# Patient Record
Sex: Female | Born: 1945 | Race: White | Hispanic: No | Smoking: Never smoker
Health system: Southern US, Community
[De-identification: ages and names within clinical notes are randomized; demographics above are authoritative.]

## PROBLEM LIST (undated history)

## (undated) DIAGNOSIS — M1990 Unspecified osteoarthritis, unspecified site: Secondary | ICD-10-CM

## (undated) DIAGNOSIS — Z87442 Personal history of urinary calculi: Secondary | ICD-10-CM

## (undated) DIAGNOSIS — Z9889 Other specified postprocedural states: Secondary | ICD-10-CM

## (undated) DIAGNOSIS — L309 Dermatitis, unspecified: Secondary | ICD-10-CM

## (undated) DIAGNOSIS — G709 Myoneural disorder, unspecified: Secondary | ICD-10-CM

## (undated) DIAGNOSIS — K5792 Diverticulitis of intestine, part unspecified, without perforation or abscess without bleeding: Secondary | ICD-10-CM

## (undated) DIAGNOSIS — M5430 Sciatica, unspecified side: Secondary | ICD-10-CM

## (undated) DIAGNOSIS — M51369 Other intervertebral disc degeneration, lumbar region without mention of lumbar back pain or lower extremity pain: Secondary | ICD-10-CM

## (undated) DIAGNOSIS — T8859XA Other complications of anesthesia, initial encounter: Secondary | ICD-10-CM

## (undated) DIAGNOSIS — J189 Pneumonia, unspecified organism: Secondary | ICD-10-CM

## (undated) DIAGNOSIS — R011 Cardiac murmur, unspecified: Secondary | ICD-10-CM

## (undated) DIAGNOSIS — K219 Gastro-esophageal reflux disease without esophagitis: Secondary | ICD-10-CM

## (undated) DIAGNOSIS — M419 Scoliosis, unspecified: Secondary | ICD-10-CM

## (undated) DIAGNOSIS — R112 Nausea with vomiting, unspecified: Secondary | ICD-10-CM

## (undated) DIAGNOSIS — R7303 Prediabetes: Secondary | ICD-10-CM

## (undated) DIAGNOSIS — Z8489 Family history of other specified conditions: Secondary | ICD-10-CM

## (undated) HISTORY — PX: ADENOIDECTOMY: SUR15

## (undated) HISTORY — PX: CHOLECYSTECTOMY: SHX55

## (undated) HISTORY — DX: Dermatitis, unspecified: L30.9

## (undated) HISTORY — PX: TONSILLECTOMY: SUR1361

## (undated) HISTORY — PX: CATARACT EXTRACTION: SUR2

## (undated) HISTORY — PX: BACK SURGERY: SHX140

## (undated) SURGERY — Surgical Case
Anesthesia: *Unknown

---

## 1970-12-08 HISTORY — PX: CHOLECYSTECTOMY: SHX55

## 1977-12-08 HISTORY — PX: GALLBLADDER SURGERY: SHX652

## 1977-12-08 HISTORY — PX: APPENDECTOMY: SHX54

## 1979-12-09 HISTORY — PX: BACK SURGERY: SHX140

## 2012-11-28 ENCOUNTER — Emergency Department (HOSPITAL_BASED_OUTPATIENT_CLINIC_OR_DEPARTMENT_OTHER)
Admission: EM | Admit: 2012-11-28 | Discharge: 2012-11-28 | Disposition: A | Discharge status: Home / Self Care | Payer: No Typology Code available for payment source | Attending: Emergency Medicine | Admitting: Emergency Medicine

## 2012-11-28 ENCOUNTER — Encounter (HOSPITAL_BASED_OUTPATIENT_CLINIC_OR_DEPARTMENT_OTHER): Payer: Self-pay | Admitting: Emergency Medicine

## 2012-11-28 ENCOUNTER — Emergency Department (HOSPITAL_BASED_OUTPATIENT_CLINIC_OR_DEPARTMENT_OTHER): Payer: No Typology Code available for payment source

## 2012-11-28 DIAGNOSIS — S6990XA Unspecified injury of unspecified wrist, hand and finger(s), initial encounter: Secondary | ICD-10-CM | POA: Insufficient documentation

## 2012-11-28 DIAGNOSIS — Y93I9 Activity, other involving external motion: Secondary | ICD-10-CM | POA: Insufficient documentation

## 2012-11-28 DIAGNOSIS — S60219A Contusion of unspecified wrist, initial encounter: Secondary | ICD-10-CM

## 2012-11-28 DIAGNOSIS — S161XXA Strain of muscle, fascia and tendon at neck level, initial encounter: Secondary | ICD-10-CM

## 2012-11-28 DIAGNOSIS — S39012A Strain of muscle, fascia and tendon of lower back, initial encounter: Secondary | ICD-10-CM

## 2012-11-28 DIAGNOSIS — S335XXA Sprain of ligaments of lumbar spine, initial encounter: Secondary | ICD-10-CM | POA: Insufficient documentation

## 2012-11-28 DIAGNOSIS — S139XXA Sprain of joints and ligaments of unspecified parts of neck, initial encounter: Secondary | ICD-10-CM | POA: Insufficient documentation

## 2012-11-28 DIAGNOSIS — S59909A Unspecified injury of unspecified elbow, initial encounter: Secondary | ICD-10-CM | POA: Insufficient documentation

## 2012-11-28 DIAGNOSIS — Z79899 Other long term (current) drug therapy: Secondary | ICD-10-CM | POA: Insufficient documentation

## 2012-11-28 DIAGNOSIS — Y92410 Unspecified street and highway as the place of occurrence of the external cause: Secondary | ICD-10-CM | POA: Insufficient documentation

## 2012-11-28 DIAGNOSIS — IMO0002 Reserved for concepts with insufficient information to code with codable children: Secondary | ICD-10-CM | POA: Insufficient documentation

## 2012-11-28 DIAGNOSIS — Z9889 Other specified postprocedural states: Secondary | ICD-10-CM | POA: Insufficient documentation

## 2012-11-28 MED ORDER — HYDROCODONE-ACETAMINOPHEN 5-325 MG PO TABS: 2.0000 | ORAL_TABLET | ORAL | Status: DC | PRN

## 2012-11-28 MED ORDER — CYCLOBENZAPRINE HCL 10 MG PO TABS: 10.0000 mg | ORAL_TABLET | Freq: Two times a day (BID) | ORAL | Status: DC | PRN
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
## 2012-11-28 NOTE — ED Notes (Signed)
Patient stated they could not take hydrocodone due to nausea & will add to their list of allergies
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
## 2012-11-28 NOTE — ED Notes (Signed)
Patient and daughter expressed their concern over weight and stated they really didn't see anyone in department, tried to explain situation to family but they stated they just wanted to leave. Added additional allergies to chart after discussing prescription to patient.

## 2012-12-17 ENCOUNTER — Emergency Department (HOSPITAL_BASED_OUTPATIENT_CLINIC_OR_DEPARTMENT_OTHER): Payer: No Typology Code available for payment source

## 2012-12-17 ENCOUNTER — Encounter (HOSPITAL_BASED_OUTPATIENT_CLINIC_OR_DEPARTMENT_OTHER): Payer: Self-pay | Admitting: Emergency Medicine

## 2012-12-17 ENCOUNTER — Emergency Department (HOSPITAL_BASED_OUTPATIENT_CLINIC_OR_DEPARTMENT_OTHER)
Admission: EM | Admit: 2012-12-17 | Discharge: 2012-12-17 | Disposition: A | Discharge status: Home / Self Care | Payer: No Typology Code available for payment source | Attending: Emergency Medicine | Admitting: Emergency Medicine

## 2012-12-17 DIAGNOSIS — S59919A Unspecified injury of unspecified forearm, initial encounter: Secondary | ICD-10-CM | POA: Insufficient documentation

## 2012-12-17 DIAGNOSIS — S32009A Unspecified fracture of unspecified lumbar vertebra, initial encounter for closed fracture: Secondary | ICD-10-CM | POA: Insufficient documentation

## 2012-12-17 DIAGNOSIS — S129XXA Fracture of neck, unspecified, initial encounter: Secondary | ICD-10-CM | POA: Insufficient documentation

## 2012-12-17 DIAGNOSIS — M1990 Unspecified osteoarthritis, unspecified site: Secondary | ICD-10-CM | POA: Insufficient documentation

## 2012-12-17 DIAGNOSIS — Y939 Activity, unspecified: Secondary | ICD-10-CM | POA: Insufficient documentation

## 2012-12-17 DIAGNOSIS — IMO0002 Reserved for concepts with insufficient information to code with codable children: Secondary | ICD-10-CM

## 2012-12-17 DIAGNOSIS — Z79899 Other long term (current) drug therapy: Secondary | ICD-10-CM | POA: Insufficient documentation

## 2012-12-17 DIAGNOSIS — Y92410 Unspecified street and highway as the place of occurrence of the external cause: Secondary | ICD-10-CM | POA: Insufficient documentation

## 2012-12-17 DIAGNOSIS — S6990XA Unspecified injury of unspecified wrist, hand and finger(s), initial encounter: Secondary | ICD-10-CM | POA: Insufficient documentation

## 2012-12-17 DIAGNOSIS — S59909A Unspecified injury of unspecified elbow, initial encounter: Secondary | ICD-10-CM | POA: Insufficient documentation

## 2012-12-17 MED ORDER — AMOXICILLIN 250 MG PO CAPS: 250.0000 mg | ORAL_CAPSULE | Freq: Three times a day (TID) | ORAL | Status: DC

## 2012-12-17 MED ORDER — AZITHROMYCIN 250 MG PO TABS: ORAL_TABLET | ORAL | Status: DC

## 2012-12-17 MED ORDER — PSEUDOEPHEDRINE HCL ER 120 MG PO TB12: 120.0000 mg | ORAL_TABLET | Freq: Two times a day (BID) | ORAL | Status: DC

## 2012-12-17 NOTE — ED Notes (Signed)
Patient transported to X-ray 
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
## 2012-12-17 NOTE — ED Notes (Signed)
MD at bedside. 
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
## 2012-12-18 ENCOUNTER — Ambulatory Visit (HOSPITAL_BASED_OUTPATIENT_CLINIC_OR_DEPARTMENT_OTHER)
Admit: 2012-12-18 | Discharge: 2012-12-18 | Disposition: A | Discharge status: Home / Self Care | Payer: No Typology Code available for payment source | Attending: Emergency Medicine | Admitting: Emergency Medicine

## 2012-12-18 DIAGNOSIS — M8448XA Pathological fracture, other site, initial encounter for fracture: Secondary | ICD-10-CM | POA: Insufficient documentation

## 2016-04-05 ENCOUNTER — Encounter (HOSPITAL_BASED_OUTPATIENT_CLINIC_OR_DEPARTMENT_OTHER): Payer: Self-pay | Admitting: Emergency Medicine

## 2016-04-05 ENCOUNTER — Emergency Department (HOSPITAL_BASED_OUTPATIENT_CLINIC_OR_DEPARTMENT_OTHER)
Admission: EM | Admit: 2016-04-05 | Discharge: 2016-04-06 | Disposition: A | Discharge status: Home / Self Care | Payer: Medicare HMO | Attending: Emergency Medicine | Admitting: Emergency Medicine

## 2016-04-05 ENCOUNTER — Emergency Department (HOSPITAL_BASED_OUTPATIENT_CLINIC_OR_DEPARTMENT_OTHER): Payer: Medicare HMO

## 2016-04-05 DIAGNOSIS — Y929 Unspecified place or not applicable: Secondary | ICD-10-CM | POA: Insufficient documentation

## 2016-04-05 DIAGNOSIS — S52135A Nondisplaced fracture of neck of left radius, initial encounter for closed fracture: Secondary | ICD-10-CM | POA: Diagnosis not present

## 2016-04-05 DIAGNOSIS — W010XXS Fall on same level from slipping, tripping and stumbling without subsequent striking against object, sequela: Secondary | ICD-10-CM | POA: Insufficient documentation

## 2016-04-05 DIAGNOSIS — Y9364 Activity, baseball: Secondary | ICD-10-CM | POA: Insufficient documentation

## 2016-04-05 DIAGNOSIS — Y999 Unspecified external cause status: Secondary | ICD-10-CM | POA: Insufficient documentation

## 2016-04-05 DIAGNOSIS — S59912A Unspecified injury of left forearm, initial encounter: Secondary | ICD-10-CM | POA: Diagnosis present

## 2016-04-05 DIAGNOSIS — S52132A Displaced fracture of neck of left radius, initial encounter for closed fracture: Secondary | ICD-10-CM

## 2016-04-05 NOTE — ED Provider Notes (Signed)
CSN: [id number]     Arrival date & time 04/05/16  1925 History  By signing my name below, I, Bethel BornBritney McCollum, attest that this documentation has been prepared under the direction and in the presence of Tilden FossaElizabeth Georgette Helmer, MD. Electronically Signed: Bethel BornBritney McCollum, ED Scribe. 04/05/2016. 8:49 PM   Chief Complaint  Patient presents with  . Arm Pain    The history is provided by the patient. No language interpreter was used.   Tracey Morgan is a 70 y.o. right-hand dominant female who presents to the Emergency Department complaining of a fall this evening at a softball game. The pt states that she tripped over an inconsistency in the sidewalk and fell to the left side.  Associated symptoms include left elbow and forearm pain, left knee abrasion, and an abrasion at the right hand. No head injury or LOC. She has broken her left wrist twice. NKDA.   History reviewed. No pertinent past medical history. Past Surgical History  Procedure Laterality Date  . Back surgery    . Cholecystectomy     History reviewed. No pertinent family history. Social History  Substance Use Topics  . Smoking status: Never Smoker   . Smokeless tobacco: None  . Alcohol Use: No   OB History    No data available     Review of Systems  Musculoskeletal: Positive for arthralgias.  Skin: Positive for wound.  Neurological: Negative for syncope.  All other systems reviewed and are negative.   Allergies  Hydrocodone; Percocet; Codeine; and Morphine and related  Home Medications   Prior to Admission medications   Medication Sig Start Date End Date Taking? Authorizing Provider  amoxicillin (AMOXIL) 250 MG capsule Take 1 capsule (250 mg total) by mouth 3 (three) times daily. 12/17/12   Nelva Nayobert Beaton, MD  cetirizine (ZYRTEC) 10 MG tablet Take 10 mg by mouth daily.    Historical Provider, MD  cyclobenzaprine (FLEXERIL) 10 MG tablet Take 1 tablet (10 mg total) by mouth 2 (two) times daily as needed for muscle spasms.  11/28/12   Rolan BuccoMelanie Belfi, MD  fluticasone (FLONASE) 50 MCG/ACT nasal spray Place 2 sprays into the nose daily.    Historical Provider, MD  HYDROcodone-acetaminophen (NORCO/VICODIN) 5-325 MG per tablet Take 2 tablets by mouth every 4 (four) hours as needed for pain. 11/28/12   Rolan BuccoMelanie Belfi, MD  pseudoephedrine (SUDAFED 12 HOUR) 120 MG 12 hr tablet Take 1 tablet (120 mg total) by mouth every 12 (twelve) hours. 12/17/12   Nelva Nayobert Beaton, MD   BP 144/87 mmHg  Pulse 91  Temp(Src) 98.3 F (36.8 C) (Oral)  Resp 20  Ht 5\' 4"  (1.626 m)  Wt 214 lb (97.07 kg)  BMI 36.72 kg/m2  SpO2 100% Physical Exam  Constitutional: She is oriented to person, place, and time. She appears well-developed and well-nourished.  HENT:  Head: Normocephalic and atraumatic.  Cardiovascular: Normal rate and regular rhythm.   No murmur heard. Pulmonary/Chest: Effort normal and breath sounds normal. No respiratory distress.  Musculoskeletal: She exhibits no edema or tenderness.  2+ radial pulses bilaterally Pain with supination and extension of the left elbow, no tenderness on exam   Neurological: She is alert and oriented to person, place, and time.  5/5 grip strength in BUE, sensation to light touch intact in BUE  Skin: Skin is warm and dry.  Psychiatric: She has a normal mood and affect. Her behavior is normal.  Nursing note and vitals reviewed.   ED Course  Procedures (including critical care time)  DIAGNOSTIC STUDIES: Oxygen Saturation is 100% on RA,  normal by my interpretation.    COORDINATION OF CARE: 8:46 PM Discussed treatment plan which includes left wrist, forearm, and elbow XRs with pt at bedside and pt agreed to plan.  Labs Review Labs Reviewed - No data to display  Imaging Review Dg Elbow Complete Left  04/05/2016  CLINICAL DATA:  Left elbow pain after fall today. Initial encounter. EXAM: LEFT ELBOW - COMPLETE 3+ VIEW COMPARISON:  None. FINDINGS: Probable nondisplaced fracture is seen involving the  radial head. No other fracture or dislocation is noted. Mild anterior fat pad displacement is noted suggesting possible joint effusion. IMPRESSION: Probable nondisplaced radial head fracture. Electronically Signed   By: Lupita Raider, M.D.   On: 04/05/2016 20:26   Dg Forearm Left  04/05/2016  CLINICAL DATA:  Tripped and fall. EXAM: LEFT FOREARM - 2 VIEW COMPARISON:  None. FINDINGS: There appears to be a nondisplaced fracture involving the radial neck. No dislocation. IMPRESSION: 1. Nondisplaced fracture involves the radial neck . Electronically Signed   By: Signa Kell M.D.   On: 04/05/2016 20:27   Dg Wrist Complete Left  04/05/2016  CLINICAL DATA:  Fall. EXAM: LEFT WRIST - COMPLETE 3+ VIEW COMPARISON:  11/28/2012 FINDINGS: There is no evidence of fracture or dislocation. Radiocarpal joint narrowing is identified. Chondrocalcinosis noted. IMPRESSION: 1. No acute findings. 2. Degenerative joint disease and chondrocalcinosis. Electronically Signed   By: Signa Kell M.D.   On: 04/05/2016 20:23   I have personally reviewed and evaluated these images as part of my medical decision-making.   EKG Interpretation None      MDM   Final diagnoses:  Radial neck fracture, left, closed, initial encounter   Patient here for evaluation of injuries following a mechanical fall. She does have pain with range of motion of the elbow but is able to fully range her elbow without difficulty. There is no bony tenderness. She is neurovascularly intact on examination. Placed in sling with outpatient orthopedic follow-up.  I personally performed the services described in this documentation, which was scribed in my presence. The recorded information has been reviewed and is accurate.    Tilden Fossa, MD 04/05/16 [phone]

## 2016-04-05 NOTE — ED Notes (Signed)
CMS intact before and after. Pt tolerated well. Pt did not have any questions.  

## 2016-04-05 NOTE — ED Notes (Signed)
Pt trip at softball game and fell on left arm and is having wrist and elbow

## 2016-04-05 NOTE — Discharge Instructions (Signed)
Radial Fracture  A radial fracture is a break in the radius bone, which is the long bone of the forearm that is on the same side as your thumb. Your forearm is the part of your arm that is between your elbow and your wrist. It is made up of two bones: the radius and the ulna.  Most radial fractures occur near the wrist (distal radialfracture) or near the elbow (radial head fracture). A distal radial fracture is the most common type of broken arm. This fracture usually occurs about an inch above the wrist. Fractures of the middle part of the bone are less common.  CAUSES   Falling with your arm outstretched is the most common cause of a radial fracture. Other causes include:   Car accidents.   Bike accidents.   A direct blow to the middle part of the radius.  RISK FACTORS   You may be at greater risk for a distal radial fracture if you are 60 years of age or older.   You may be at greater risk for a radial head fracture if you are:    Female.    30-40 years old.   You may be at a greater risk for all types of radial fractures if you have a condition that causes your bones to be weak or thin (osteoporosis).  SIGNS AND SYMPTOMS  A radial fracture causes pain immediately after the injury. Other signs and symptoms include:   An abnormal bend or bump in your arm (deformity).   Swelling.   Bruising.   Numbness or tingling.   Tenderness.   Limited movement.  DIAGNOSIS   Your health care provider may diagnose a radial fracture based on:   Your symptoms.   Your medical history, including any recent injury.   A physical exam. Your health care provider will look for any deformity and feel for tenderness over the break. Your health care provider will also check whether the bone is out of place.   An X-ray exam to confirm the diagnosis and learn more about the type of fracture.  TREATMENT  The goals of treatment are to get the bone in proper position for healing and to keep it from moving so it will heal over  time. Your treatment will depend on many factors, especially the type of fracture that you have.   If the fractured bone:    Is in the correct position (nondisplaced), you may only need to wear a cast or a splint.    Has a slightly displaced fracture, you may need to have the bones moved back into place manually (closed reduction) before the splint or cast is put on.   You may have a temporary splint before you have a plaster cast. The splint allows room for some swelling. After a few days, a cast can replace the splint.    You may have to wear the cast for about 6 weeks or as directed by your health care provider.    The cast may be changed after about 3 weeks or as directed by your health care provider.   After your cast is taken off, you may need physical therapy to regain full movement in your wrist or elbow.   You may need emergency surgery if you have:    A fractured bone that is out of position (displaced).    A fracture with multiple fragments (comminuted fracture).    A fracture that breaks the skin (open fracture). This type of   fracture may require surgical wires, plates, or screws to hold the bone in place.   You may have X-rays every couple of weeks to check on your healing.  HOME CARE INSTRUCTIONS   Keep the injured arm above the level of your heart while you are sitting or lying down. This helps to reduce swelling and pain.   Apply ice to the injured area:    Put ice in a plastic bag.    Place a towel between your skin and the bag.    Leave the ice on for 20 minutes, 2-3 times per day.   Move your fingers often to avoid stiffness and to minimize swelling.   If you have a plaster or fiberglass cast:    Do not try to scratch the skin under the cast using sharp or pointed objects.    Check the skin around the cast every day. You may put lotion on any red or sore areas.    Keep your cast dry and clean.   If you have a plaster splint:    Wear the splint as directed.    Loosen the elastic around  the splint if your fingers become numb and tingle, or if they turn cold and blue.   Do not put pressure on any part of your cast until it is fully hardened. Rest your cast only on a pillow for the first 24 hours.   Protect your cast or splint while bathing or showering, as directed by your health care provider. Do not put your cast or splint into water.   Take medicines only as directed by your health care provider.   Return to activities, such as sports, as directed by your health care provider. Ask your health care provider what activities are safe for you.   Keep all follow-up visits as directed by your health care provider. This is important.  SEEK MEDICAL CARE IF:   Your pain medicine is not helping.   Your cast gets damaged or it breaks.   Your cast becomes loose.   Your cast gets wet.   You have more severe pain or swelling than you did before the cast.   You have severe pain when stretching your fingers.   You continue to have pain or stiffness in your elbow or your wrist after your cast is taken off.  SEEK IMMEDIATE MEDICAL CARE IF:   You cannot move your fingers.   You lose feeling in your fingers or your hand.   Your hand or your fingers turn cold and pale or blue.   You notice a bad smell coming from your cast.   You have drainage from underneath your cast.   You have new stains from blood or drainage seeping through your cast.     This information is not intended to replace advice given to you by your health care provider. Make sure you discuss any questions you have with your health care provider.     Document Released: 05/07/2006 Document Revised: 12/15/2014 Document Reviewed: 05/19/2014  Elsevier Interactive Patient Education 2016 [employer]

## 2016-04-05 NOTE — ED Notes (Signed)
Tripped on uneven pavement, no LOC, denies: head, neck, back or pelvis pain, c/o L elbow and FA pain, worse with movement.

## 2016-04-25 DIAGNOSIS — K219 Gastro-esophageal reflux disease without esophagitis: Secondary | ICD-10-CM | POA: Diagnosis present

## 2016-04-25 DIAGNOSIS — R1319 Other dysphagia: Secondary | ICD-10-CM | POA: Insufficient documentation

## 2017-08-10 ENCOUNTER — Emergency Department (HOSPITAL_BASED_OUTPATIENT_CLINIC_OR_DEPARTMENT_OTHER): Payer: Medicare Other

## 2017-08-10 ENCOUNTER — Encounter (HOSPITAL_BASED_OUTPATIENT_CLINIC_OR_DEPARTMENT_OTHER): Payer: Self-pay | Admitting: *Deleted

## 2017-08-10 ENCOUNTER — Emergency Department (HOSPITAL_BASED_OUTPATIENT_CLINIC_OR_DEPARTMENT_OTHER)
Admission: EM | Admit: 2017-08-10 | Discharge: 2017-08-10 | Disposition: A | Discharge status: Home / Self Care | Payer: Medicare Other | Attending: Emergency Medicine | Admitting: Emergency Medicine

## 2017-08-10 DIAGNOSIS — R109 Unspecified abdominal pain: Secondary | ICD-10-CM | POA: Diagnosis present

## 2017-08-10 DIAGNOSIS — K5792 Diverticulitis of intestine, part unspecified, without perforation or abscess without bleeding: Secondary | ICD-10-CM

## 2017-08-10 DIAGNOSIS — Z79899 Other long term (current) drug therapy: Secondary | ICD-10-CM | POA: Diagnosis not present

## 2017-08-10 DIAGNOSIS — K5732 Diverticulitis of large intestine without perforation or abscess without bleeding: Secondary | ICD-10-CM | POA: Insufficient documentation

## 2017-08-10 HISTORY — DX: Diverticulitis of intestine, part unspecified, without perforation or abscess without bleeding: K57.92

## 2017-08-10 HISTORY — DX: Gastro-esophageal reflux disease without esophagitis: K21.9

## 2017-08-10 LAB — COMPREHENSIVE METABOLIC PANEL
ALBUMIN: 3.8 g/dL (ref 3.5–5.0)
ALT: 14 U/L (ref 14–54)
AST: 17 U/L (ref 15–41)
Alkaline Phosphatase: 110 U/L (ref 38–126)
Anion gap: 8 (ref 5–15)
BUN: 10 mg/dL (ref 6–20)
CHLORIDE: 100 mmol/L — AB (ref 101–111)
CO2: 25 mmol/L (ref 22–32)
Calcium: 9.2 mg/dL (ref 8.9–10.3)
Creatinine, Ser: 0.62 mg/dL (ref 0.44–1.00)
GFR calc Af Amer: >60 mL/min (ref >60)
GFR calc non Af Amer: >60 mL/min (ref >60)
Glucose, Bld: 156 mg/dL — ABNORMAL HIGH (ref 65–99)
POTASSIUM: 3.4 mmol/L — AB (ref 3.5–5.1)
SODIUM: 133 mmol/L — AB (ref 135–145)
Total Bilirubin: 1.9 mg/dL — ABNORMAL HIGH (ref 0.3–1.2)
Total Protein: 7.8 g/dL (ref 6.5–8.1)

## 2017-08-10 LAB — CBC WITH DIFFERENTIAL/PLATELET
BASOS ABS: 0 10*3/uL (ref 0.0–0.1)
Basophils Relative: 0 %
EOS PCT: 0 %
Eosinophils Absolute: 0 10*3/uL (ref 0.0–0.7)
HEMATOCRIT: 39.7 % (ref 36.0–46.0)
Hemoglobin: 13.3 g/dL (ref 12.0–15.0)
LYMPHS ABS: 1.4 10*3/uL (ref 0.7–4.0)
LYMPHS PCT: 11 %
MCH: 30.4 pg (ref 26.0–34.0)
MCHC: 33.5 g/dL (ref 30.0–36.0)
MCV: 90.8 fL (ref 78.0–100.0)
Monocytes Absolute: 1.1 10*3/uL — ABNORMAL HIGH (ref 0.1–1.0)
Monocytes Relative: 9 %
NEUTROS ABS: 9.8 10*3/uL — AB (ref 1.7–7.7)
Neutrophils Relative %: 80 %
PLATELETS: 411 10*3/uL — AB (ref 150–400)
RBC: 4.37 MIL/uL (ref 3.87–5.11)
RDW: 12.2 % (ref 11.5–15.5)
WBC: 12.4 10*3/uL — ABNORMAL HIGH (ref 4.0–10.5)

## 2017-08-10 LAB — URINALYSIS, MICROSCOPIC (REFLEX)

## 2017-08-10 LAB — URINALYSIS, ROUTINE W REFLEX MICROSCOPIC
Bilirubin Urine: NEGATIVE
GLUCOSE, UA: NEGATIVE mg/dL
Ketones, ur: NEGATIVE mg/dL
Nitrite: NEGATIVE
PH: 6.5 (ref 5.0–8.0)
PROTEIN: NEGATIVE mg/dL
Specific Gravity, Urine: <1.005 — ABNORMAL LOW (ref 1.005–1.030)

## 2017-08-10 LAB — LIPASE, BLOOD: Lipase: 24 U/L (ref 11–51)

## 2017-08-10 MED ORDER — ACETAMINOPHEN 325 MG PO TABS
650.0000 mg | ORAL_TABLET | Freq: Once | ORAL | Status: AC
  Administered 2017-08-10: 650 mg via ORAL
  Filled 2017-08-10: qty 2

## 2017-08-10 MED ORDER — ONDANSETRON 4 MG PO TBDP: 4.0000 mg | ORAL_TABLET | Freq: Three times a day (TID) | ORAL | 0 refills | Status: DC | PRN

## 2017-08-10 MED ORDER — POTASSIUM CHLORIDE CRYS ER 20 MEQ PO TBCR
40.0000 meq | EXTENDED_RELEASE_TABLET | Freq: Once | ORAL | Status: AC
  Administered 2017-08-10: 40 meq via ORAL
  Filled 2017-08-10: qty 2

## 2017-08-10 MED ORDER — ONDANSETRON HCL 4 MG/2ML IJ SOLN
4.0000 mg | Freq: Once | INTRAMUSCULAR | Status: AC
  Administered 2017-08-10: 4 mg via INTRAVENOUS
  Filled 2017-08-10: qty 2

## 2017-08-10 MED ORDER — IOPAMIDOL (ISOVUE-300) INJECTION 61%
100.0000 mL | Freq: Once | INTRAVENOUS | Status: AC | PRN
Start: 2017-08-10 — End: 2017-08-10
  Administered 2017-08-10: 100 mL via INTRAVENOUS

## 2017-08-10 MED ORDER — AMOXICILLIN-POT CLAVULANATE 875-125 MG PO TABS
1.0000 | ORAL_TABLET | Freq: Once | ORAL | Status: AC
  Administered 2017-08-10: 1 via ORAL
  Filled 2017-08-10: qty 1

## 2017-08-10 MED ORDER — SODIUM CHLORIDE 0.9 % IV BOLUS (SEPSIS)
1000.0000 mL | Freq: Once | INTRAVENOUS | Status: AC
  Administered 2017-08-10: 1000 mL via INTRAVENOUS

## 2017-08-10 MED ORDER — AMOXICILLIN-POT CLAVULANATE 875-125 MG PO TABS: 1.0000 | ORAL_TABLET | Freq: Two times a day (BID) | ORAL | 0 refills | Status: DC

## 2017-08-10 NOTE — ED Triage Notes (Signed)
pt c/o left lower abd pain x 2 days HX diverticulitis

## 2017-08-10 NOTE — ED Notes (Signed)
MD at bedside. 

## 2017-08-10 NOTE — ED Provider Notes (Signed)
[facility] Provider Note   CSN: [id number] Arrival date & time: 08/10/17  1442     History   Chief Complaint Chief Complaint  Patient presents with  . Abdominal Pain    HPI Tracey Morgan is a 71 y.o. female.  HPI  71 year old female presents with concern for diverticulitis. She states she has had left-sided abdominal pain since yesterday. It was worse yesterday and she had a fever up to 101. She has had chills. Feels similar to prior diverticulitis, most recently had an episode in May. She states that she took me relax because she was constipated and she did have a bowel movement although it was soft. She has not had any diarrhea. She's been nauseated but has not had vomiting. However she states that the pain worsens with any type of movement or palpation in her left lower quadrant. Yesterday she had an episode of dysuria but no further dysuria today. No hematuria. No new back pain. States now she is developing a headache.  Past Medical History:  Diagnosis Date  . Diverticulitis   . GERD (gastroesophageal reflux disease)     There are no active problems to display for this patient.   Past Surgical History:  Procedure Laterality Date  . BACK SURGERY    . CHOLECYSTECTOMY      OB History    No data available       Home Medications    Prior to Admission medications   Medication Sig Start Date End Date Taking? Authorizing Provider  famotidine (PEPCID) 40 MG tablet Take 40 mg by mouth daily.   Yes [provider]  polyethylene glycol (MIRALAX / GLYCOLAX) packet Take 17 g by mouth daily.   Yes [provider]  amoxicillin-clavulanate (AUGMENTIN) 875-125 MG tablet Take 1 tablet by mouth 2 (two) times daily. One po bid x 7 days 08/10/17   Pricilla LovelessGoldston, Demetre Monaco, MD  cetirizine (ZYRTEC) 10 MG tablet Take 10 mg by mouth daily.    [provider]  cyclobenzaprine (FLEXERIL) 10 MG tablet Take 1 tablet (10 mg total) by mouth 2 (two) times daily as  needed for muscle spasms. 11/28/12   Rolan BuccoBelfi, Melanie, MD  fluticasone (FLONASE) 50 MCG/ACT nasal spray Place 2 sprays into the nose daily.    [provider]  ondansetron (ZOFRAN ODT) 4 MG disintegrating tablet Take 1 tablet (4 mg total) by mouth every 8 (eight) hours as needed for nausea or vomiting. 08/10/17   Pricilla LovelessGoldston, Adal Sereno, MD  pseudoephedrine (SUDAFED 12 HOUR) 120 MG 12 hr tablet Take 1 tablet (120 mg total) by mouth every 12 (twelve) hours. 12/17/12   Nelva NayBeaton, Robert, MD    Family History No family history on file.  Social History Social History  Substance Use Topics  . Smoking status: Never Smoker  . Smokeless tobacco: Not on file  . Alcohol use No     Allergies   Hydrocodone; Percocet [oxycodone-acetaminophen]; Codeine; and Morphine and related   Review of Systems Review of Systems  Constitutional: Positive for chills and fever.  Respiratory: Negative for shortness of breath.   Cardiovascular: Negative for chest pain.  Gastrointestinal: Positive for abdominal pain, constipation and nausea. Negative for diarrhea and vomiting.  Genitourinary: Positive for dysuria. Negative for hematuria.  Musculoskeletal: Negative for back pain.  Neurological: Positive for headaches.  All other systems reviewed and are negative.    Physical Exam Updated Vital Signs BP 138/77 (BP Location: Left Arm)   Pulse 81   Temp 98.1 F (36.7  C) (Oral)   Resp 16   Ht 5\' 4"  (1.626 m)   Wt 93 kg (205 lb)   SpO2 100%   BMI 35.19 kg/m   Physical Exam  Constitutional: She is oriented to person, place, and time. She appears well-developed and well-nourished. No distress.  HENT:  Head: Normocephalic and atraumatic.  Right Ear: External ear normal.  Left Ear: External ear normal.  Nose: Nose normal.  Eyes: Right eye exhibits no discharge. Left eye exhibits no discharge.  Cardiovascular: Normal rate, regular rhythm and normal heart sounds.   Pulmonary/Chest: Effort normal and breath  sounds normal.  Abdominal: Soft. There is tenderness in the left lower quadrant. There is no CVA tenderness.  LLQ with significant pain and guarding. Feels firm, though no obvious hernia  Neurological: She is alert and oriented to person, place, and time.  Skin: Skin is warm and dry. She is not diaphoretic.  Nursing note and vitals reviewed.    ED Treatments / Results  Labs (all labs ordered are listed, but only abnormal results are displayed) Labs Reviewed  COMPREHENSIVE METABOLIC PANEL - Abnormal; Notable for the following:       Result Value   Sodium 133 (*)    Potassium 3.4 (*)    Chloride 100 (*)    Glucose, Bld 156 (*)    Total Bilirubin 1.9 (*)    All other components within normal limits  CBC WITH DIFFERENTIAL/PLATELET - Abnormal; Notable for the following:    WBC 12.4 (*)    Platelets 411 (*)    Neutro Abs 9.8 (*)    Monocytes Absolute 1.1 (*)    All other components within normal limits  URINALYSIS, ROUTINE W REFLEX MICROSCOPIC - Abnormal; Notable for the following:    Specific Gravity, Urine <1.005 (*)    Hgb urine dipstick TRACE (*)    Leukocytes, UA TRACE (*)    All other components within normal limits  URINALYSIS, MICROSCOPIC (REFLEX) - Abnormal; Notable for the following:    Bacteria, UA FEW (*)    Squamous Epithelial / LPF 0-5 (*)    All other components within normal limits  LIPASE, BLOOD    EKG  EKG Interpretation None       Radiology Ct Abdomen Pelvis W Contrast  Result Date: 08/10/2017 CLINICAL DATA:  Abdominal pain EXAM: CT ABDOMEN AND PELVIS WITH CONTRAST TECHNIQUE: Multidetector CT imaging of the abdomen and pelvis was performed using the standard protocol following bolus administration of intravenous contrast. CONTRAST:  ISOVUE-300 IOPAMIDOL (ISOVUE-300) INJECTION 61% COMPARISON:  MRI  12/18/2012 FINDINGS: Lower chest: Lung bases demonstrate no acute consolidation or pleural effusion. Dense mitral calcification. Normal heart size  Hepatobiliary: Hepatic steatosis. Surgical clips at the gallbladder fossa. Negative for biliary dilatation Pancreas: Unremarkable. No pancreatic ductal dilatation or surrounding inflammatory changes. Spleen: Normal in size without focal abnormality. Adrenals/Urinary Tract: Adrenal glands are within normal limits. Negative for hydronephrosis. Punctate stone lower pole left kidney. Bladder within normal limits Stomach/Bowel: Small distal gastroesophageal hiatal hernia. No dilated small bowel. Appendix poorly identified but no right lower quadrant inflammatory process Sigmoid colon diverticula. Mild to moderate inflammation and fluid in the left lower quadrant with sigmoid colon thickening. No extraluminal gas. Vascular/Lymphatic: Aortic atherosclerosis. No enlarged abdominal or pelvic lymph nodes. Reproductive: Uterus and bilateral adnexa are unremarkable. Other: Trace free fluid in the pelvis. Negative for free air. Fat in the umbilicus Musculoskeletal: Stable compression deformity of L2. Degenerative changes, greatest at L4-L5. IMPRESSION: 1. Wall thickening and surrounding  inflammatory process involving the sigmoid colon, consistent with acute diverticulitis. No extraluminal gas collection. No abscess. Eventual colonoscopy follow-up may be considered to exclude other cause of wall thickening as indicated 2. Trace amount of free fluid in the pelvis 3. Hepatic steatosis 4. Nonobstructing stone in the left kidney Electronically Signed   By: Jasmine Pang M.D.   On: 08/10/2017 17:22    Procedures Procedures (including critical care time)  Medications Ordered in ED Medications  amoxicillin-clavulanate (AUGMENTIN) 875-125 MG per tablet 1 tablet (not administered)  sodium chloride 0.9 % bolus 1,000 mL (1,000 mLs Intravenous New Bag/Given 08/10/17 1510)  ondansetron (ZOFRAN) injection 4 mg (4 mg Intravenous Given 08/10/17 1510)  acetaminophen (TYLENOL) tablet 650 mg (650 mg Oral Given 08/10/17 1510)  potassium  chloride SA (K-DUR,KLOR-CON) CR tablet 40 mEq (40 mEq Oral Given 08/10/17 1543)  iopamidol (ISOVUE-300) 61 % injection 100 mL (100 mLs Intravenous Contrast Given 08/10/17 1659)     Initial Impression / Assessment and Plan / ED Course  I have reviewed the triage vital signs and the nursing notes.  Pertinent labs & imaging results that were available during my care of the patient were reviewed by me and considered in my medical decision making (see chart for details).     CT scan confirms acute diverticulitis but also shows no copy getting factors such as abscess or perforation. She is feeling better. No further nausea. She declines anything stronger than Tylenol for pain. She wants to go home which I think is reasonable. I have discussed strict return precautions. She will be given Augmentin for antibiotics and Zofran for nausea. Follow-up with PCP and her gastroenterologist.  Final Clinical Impressions(s) / ED Diagnoses   Final diagnoses:  Acute diverticulitis    New Prescriptions New Prescriptions   AMOXICILLIN-CLAVULANATE (AUGMENTIN) 875-125 MG TABLET    Take 1 tablet by mouth 2 (two) times daily. One po bid x 7 days   ONDANSETRON (ZOFRAN ODT) 4 MG DISINTEGRATING TABLET    Take 1 tablet (4 mg total) by mouth every 8 (eight) hours as needed for nausea or vomiting.     Pricilla Loveless, MD 08/10/17 1739

## 2017-08-10 NOTE — ED Notes (Signed)
Report given to Ruth, RN

## 2017-08-10 NOTE — ED Notes (Signed)
Pt attempting to obtain urine specimen at this time.

## 2018-01-16 ENCOUNTER — Other Ambulatory Visit: Payer: Self-pay

## 2018-01-16 ENCOUNTER — Encounter (HOSPITAL_BASED_OUTPATIENT_CLINIC_OR_DEPARTMENT_OTHER): Payer: Self-pay | Admitting: *Deleted

## 2018-01-16 ENCOUNTER — Emergency Department (HOSPITAL_BASED_OUTPATIENT_CLINIC_OR_DEPARTMENT_OTHER)
Admission: EM | Admit: 2018-01-16 | Discharge: 2018-01-16 | Disposition: A | Discharge status: Home / Self Care | Payer: Medicare HMO | Attending: Physician Assistant | Admitting: Physician Assistant

## 2018-01-16 DIAGNOSIS — Z79899 Other long term (current) drug therapy: Secondary | ICD-10-CM | POA: Diagnosis not present

## 2018-01-16 DIAGNOSIS — K0889 Other specified disorders of teeth and supporting structures: Secondary | ICD-10-CM | POA: Diagnosis not present

## 2018-01-16 MED ORDER — PENICILLIN V POTASSIUM 500 MG PO TABS: 500.0000 mg | ORAL_TABLET | Freq: Four times a day (QID) | ORAL | 0 refills | Status: AC

## 2018-01-16 NOTE — Discharge Instructions (Signed)
Please read instructions below. Take the antibiotic, Penicillin V, 4 times per day until they are gone. You can take aleve or ibuprofen with meals, as needed for pain. Schedule an appointment with your dentist. Return to the ER for difficulty swallowing or breathing, fever, or new or worsening symptoms.

## 2018-01-16 NOTE — ED Notes (Signed)
Pt describes left jaw pain which radiates to her face and neck. States she has a broken tooth in that area and has an appt for in March for same. Also states area is tender to touch. Felt a little dizzy when she first got up the a.m., but it resolved.

## 2018-01-16 NOTE — ED Triage Notes (Signed)
Pt reports right neck pain, jaw pain x 1 days. Denies CP, SOB.  Reports pain with eating and a knot and swelling on the right side of her jaw. States that she broke a tooth off a couple weeks ago.

## 2018-01-16 NOTE — ED Provider Notes (Signed)
[facility] EMERGENCY DEPARTMENT Provider Note   CSN: [id number] Arrival date & time: 01/16/18  1626     History   Chief Complaint Chief Complaint  Patient presents with  . Jaw Pain    HPI Tracey Morgan is a 72 y.o. female w PMHx GERD, presenting to the ED with right lower dental pain.  Patient states she broke 1 of her molars, and has been having pain in that tooth since then.  She has a dental appointment in March, however is here for her pain and concern of "a knot" on her right jaw.  Patient denies fever, drainage in her mouth, difficulty swallowing or breathing.  Has been treating her pain with ibuprofen.   The history is provided by the patient.    Past Medical History:  Diagnosis Date  . Diverticulitis   . GERD (gastroesophageal reflux disease)     There are no active problems to display for this patient.   Past Surgical History:  Procedure Laterality Date  . BACK SURGERY    . CHOLECYSTECTOMY      OB History    No data available       Home Medications    Prior to Admission medications   Medication Sig Start Date End Date Taking? Authorizing Provider  cetirizine (ZYRTEC) 10 MG tablet Take 10 mg by mouth daily.    [provider]  famotidine (PEPCID) 40 MG tablet Take 40 mg by mouth daily.    [provider]  fluticasone (FLONASE) 50 MCG/ACT nasal spray Place 2 sprays into the nose daily.    [provider]  penicillin v potassium (VEETID) 500 MG tablet Take 1 tablet (500 mg total) by mouth 4 (four) times daily for 7 days. 01/16/18 01/23/18  Magdala Brahmbhatt, [place] N, PA-C  polyethylene glycol (MIRALAX / GLYCOLAX) packet Take 17 g by mouth daily.    [provider]  pseudoephedrine (SUDAFED 12 HOUR) 120 MG 12 hr tablet Take 1 tablet (120 mg total) by mouth every 12 (twelve) hours. 12/17/12   Nelva NayBeaton, Robert, MD    Family History History reviewed. No pertinent family history.  Social History Social History   Tobacco Use    . Smoking status: Never Smoker  Substance Use Topics  . Alcohol use: No  . Drug use: No     Allergies   Hydrocodone; Percocet [oxycodone-acetaminophen]; Codeine; and Morphine and related   Review of Systems Review of Systems  Constitutional: Negative for chills and fever.  HENT: Positive for dental problem. Negative for sore throat, trouble swallowing and voice change.   All other systems reviewed and are negative.  Physical Exam Updated Vital Signs BP (!) 154/89 (BP Location: Right Arm)   Pulse 80   Temp 98.2 F (36.8 C) (Oral)   Resp 20   Ht 5\' 4"  (1.626 m)   Wt 95.3 kg (210 lb)   SpO2 100%   BMI 36.05 kg/m   Physical Exam  Constitutional: She appears well-developed and well-nourished. No distress.  Well-appearing, tolerating secretions.  HENT:  Head: Normocephalic and atraumatic.  No edema noted to the face.  Tooth 29 with partial filling, and tooth is broken. Pulp is not visible.  Mild gingival erythema surrounding, no fluctuant abscess.  No edema or tenderness in the sublingual area.  No palpable masses.  No lymphadenopathy appreciated.  No pharyngeal edema, uvula midline, no trismus.  Eyes: Conjunctivae are normal.  Cardiovascular: Normal rate.  Pulmonary/Chest: Effort normal.  Psychiatric: She has a normal mood  and affect. Her behavior is normal.  Nursing note and vitals reviewed.    ED Treatments / Results  Labs (all labs ordered are listed, but only abnormal results are displayed) Labs Reviewed - No data to display  EKG  EKG Interpretation None       Radiology No results found.  Procedures Procedures (including critical care time)  Medications Ordered in ED Medications - No data to display   Initial Impression / Assessment and Plan / ED Course  I have reviewed the triage vital signs and the nursing notes.  Pertinent labs & imaging results that were available during my care of the patient were reviewed by me and considered in my medical  decision making (see chart for details).     Patient with dental pain.  No gross abscess.  VSS, afebrile, tolerating secretions. Exam unconcerning for peritonsillar abscess, Ludwig's angina or spread of infection.  Will treat with penicillin and pain medicine.  Urged patient to follow-up with dentist. Pt safe for discharge.  Discussed results, findings, treatment and follow up. Patient advised of return precautions. Patient verbalized understanding and agreed with plan.  Final Clinical Impressions(s) / ED Diagnoses   Final diagnoses:  Pain, dental    ED Discharge Orders        Ordered    penicillin v potassium (VEETID) 500 MG tablet  4 times daily     01/16/18 1904       Kilan Banfill, [place] N, PA-C 01/16/18 1905    Abelino Derrick, MD 01/16/18 [id number]

## 2018-10-22 ENCOUNTER — Emergency Department (HOSPITAL_BASED_OUTPATIENT_CLINIC_OR_DEPARTMENT_OTHER)
Admission: EM | Admit: 2018-10-22 | Discharge: 2018-10-22 | Disposition: A | Discharge status: Home / Self Care | Payer: Medicare HMO | Attending: Emergency Medicine | Admitting: Emergency Medicine

## 2018-10-22 ENCOUNTER — Encounter (HOSPITAL_BASED_OUTPATIENT_CLINIC_OR_DEPARTMENT_OTHER): Payer: Self-pay | Admitting: *Deleted

## 2018-10-22 ENCOUNTER — Other Ambulatory Visit: Payer: Self-pay

## 2018-10-22 ENCOUNTER — Emergency Department (HOSPITAL_BASED_OUTPATIENT_CLINIC_OR_DEPARTMENT_OTHER): Payer: Medicare HMO

## 2018-10-22 DIAGNOSIS — Z79899 Other long term (current) drug therapy: Secondary | ICD-10-CM | POA: Diagnosis not present

## 2018-10-22 DIAGNOSIS — K219 Gastro-esophageal reflux disease without esophagitis: Secondary | ICD-10-CM | POA: Diagnosis not present

## 2018-10-22 DIAGNOSIS — K529 Noninfective gastroenteritis and colitis, unspecified: Secondary | ICD-10-CM

## 2018-10-22 DIAGNOSIS — R1012 Left upper quadrant pain: Secondary | ICD-10-CM | POA: Diagnosis present

## 2018-10-22 LAB — CBC WITH DIFFERENTIAL/PLATELET
ABS IMMATURE GRANULOCYTES: 0.03 10*3/uL (ref 0.00–0.07)
BASOS PCT: 0 %
Basophils Absolute: 0 10*3/uL (ref 0.0–0.1)
Eosinophils Absolute: 0 10*3/uL (ref 0.0–0.5)
Eosinophils Relative: 0 %
HCT: 44.5 % (ref 36.0–46.0)
Hemoglobin: 14.4 g/dL (ref 12.0–15.0)
IMMATURE GRANULOCYTES: 0 %
LYMPHS PCT: 8 %
Lymphs Abs: 0.9 10*3/uL (ref 0.7–4.0)
MCH: 30.1 pg (ref 26.0–34.0)
MCHC: 32.4 g/dL (ref 30.0–36.0)
MCV: 93.1 fL (ref 80.0–100.0)
MONO ABS: 0.9 10*3/uL (ref 0.1–1.0)
Monocytes Relative: 8 %
NEUTROS ABS: 9.7 10*3/uL — AB (ref 1.7–7.7)
NEUTROS PCT: 84 %
PLATELETS: 395 10*3/uL (ref 150–400)
RBC: 4.78 MIL/uL (ref 3.87–5.11)
RDW: 11.9 % (ref 11.5–15.5)
WBC: 11.6 10*3/uL — AB (ref 4.0–10.5)
nRBC: 0 % (ref 0.0–0.2)

## 2018-10-22 LAB — COMPREHENSIVE METABOLIC PANEL
ALT: 17 U/L (ref 0–44)
AST: 19 U/L (ref 15–41)
Albumin: 3.7 g/dL (ref 3.5–5.0)
Alkaline Phosphatase: 105 U/L (ref 38–126)
Anion gap: 11 (ref 5–15)
BILIRUBIN TOTAL: 1.9 mg/dL — AB (ref 0.3–1.2)
BUN: 17 mg/dL (ref 8–23)
CALCIUM: 9.4 mg/dL (ref 8.9–10.3)
CHLORIDE: 99 mmol/L (ref 98–111)
CO2: 27 mmol/L (ref 22–32)
CREATININE: 0.76 mg/dL (ref 0.44–1.00)
GFR calc Af Amer: >60 mL/min (ref >60)
GLUCOSE: 196 mg/dL — AB (ref 70–99)
Potassium: 3.6 mmol/L (ref 3.5–5.1)
SODIUM: 137 mmol/L (ref 135–145)
Total Protein: 6.9 g/dL (ref 6.5–8.1)

## 2018-10-22 LAB — LIPASE, BLOOD: Lipase: 26 U/L (ref 11–51)

## 2018-10-22 LAB — URINALYSIS, ROUTINE W REFLEX MICROSCOPIC
Glucose, UA: NEGATIVE mg/dL
Ketones, ur: 15 mg/dL — AB
Nitrite: POSITIVE — AB
PH: 5.5 (ref 5.0–8.0)
Protein, ur: 30 mg/dL — AB
Specific Gravity, Urine: >1.03 — ABNORMAL HIGH (ref 1.005–1.030)

## 2018-10-22 LAB — URINALYSIS, MICROSCOPIC (REFLEX)

## 2018-10-22 MED ORDER — DICYCLOMINE HCL 10 MG/ML IM SOLN
20.0000 mg | Freq: Once | INTRAMUSCULAR | Status: AC
  Administered 2018-10-22: 20 mg via INTRAMUSCULAR
  Filled 2018-10-22: qty 2

## 2018-10-22 MED ORDER — IOPAMIDOL (ISOVUE-300) INJECTION 61%
100.0000 mL | Freq: Once | INTRAVENOUS | Status: AC | PRN
  Administered 2018-10-22: 100 mL via INTRAVENOUS

## 2018-10-22 MED ORDER — SODIUM CHLORIDE 0.9 % IV BOLUS
500.0000 mL | Freq: Once | INTRAVENOUS | Status: AC
Start: 2018-10-22 — End: 2018-10-22
  Administered 2018-10-22: 500 mL via INTRAVENOUS

## 2018-10-22 MED ORDER — ACETAMINOPHEN 325 MG PO TABS
650.0000 mg | ORAL_TABLET | Freq: Once | ORAL | Status: AC
  Administered 2018-10-22: 650 mg via ORAL
  Filled 2018-10-22: qty 2

## 2018-10-22 MED ORDER — SODIUM CHLORIDE 0.9 % IV BOLUS
500.0000 mL | Freq: Once | INTRAVENOUS | Status: AC
  Administered 2018-10-22: 500 mL via INTRAVENOUS

## 2018-10-22 MED ORDER — ONDANSETRON HCL 4 MG/2ML IJ SOLN
4.0000 mg | Freq: Once | INTRAMUSCULAR | Status: AC
  Administered 2018-10-22: 4 mg via INTRAVENOUS
  Filled 2018-10-22: qty 2

## 2018-10-22 MED ORDER — AMOXICILLIN-POT CLAVULANATE 875-125 MG PO TABS: 1.0000 | ORAL_TABLET | Freq: Two times a day (BID) | ORAL | 0 refills | Status: DC

## 2018-10-22 MED ORDER — ONDANSETRON 4 MG PO TBDP: 4.0000 mg | ORAL_TABLET | Freq: Three times a day (TID) | ORAL | 0 refills | Status: DC | PRN

## 2018-10-22 NOTE — ED Provider Notes (Signed)
[facility] EMERGENCY DEPARTMENT Provider Note   CSN: [id number] Arrival date & time: 10/22/18  1325     History   Chief Complaint Chief Complaint  Patient presents with  . Abdominal Pain    HPI Tracey Morgan is a 72 y.o. female with a hx of diverticulitis, GERD, and prior cholecystectomy who presents to the ED with complaints of abdominal pain since yesterday. Patient states that she felt she was a bit constipated 2 days ago, last BM was day prior, and she took some docusate. She states this resulted in abdominal pain with nausea, vomiting, and diarrhea. She states she has had TNTC episodes of emesis and diarrhea- each of which have been non bloody. She states abdominal pain is located to LUQ/LLQ, constant, much worse with movement, somewhat alleviated with leftover zofran she took yesterday. She states that she thinks she has diverticulitis. Has had some chills, no fevers. Denies fevers, melena, hematochezia, chest pain, dyspnea, dysuria, or hematuria. She relays she was treated for UTI with keflex 500 mg BID starting 1 week ago, she was unable to take this yesterday secondary to vomiting, but urinary sxs have resolved.   HPI  Past Medical History:  Diagnosis Date  . Diverticulitis   . GERD (gastroesophageal reflux disease)     There are no active problems to display for this patient.   Past Surgical History:  Procedure Laterality Date  . BACK SURGERY    . CHOLECYSTECTOMY       OB History   None      Home Medications    Prior to Admission medications   Medication Sig Start Date End Date Taking? Authorizing Provider  Cephalexin (KEFLEX PO) Take by mouth.   Yes [provider]  cetirizine (ZYRTEC) 10 MG tablet Take 10 mg by mouth daily.   Yes [provider]  famotidine (PEPCID) 40 MG tablet Take 40 mg by mouth daily.   Yes [provider]  fluticasone (FLONASE) 50 MCG/ACT nasal spray Place 2 sprays into the nose daily.   Yes  [provider]  HYDROCHLOROTHIAZIDE PO Take by mouth.   Yes [provider]  METFORMIN HCL PO Take by mouth.   Yes [provider]  polyethylene glycol (MIRALAX / GLYCOLAX) packet Take 17 g by mouth daily.   Yes [provider]  pseudoephedrine (SUDAFED 12 HOUR) 120 MG 12 hr tablet Take 1 tablet (120 mg total) by mouth every 12 (twelve) hours. 12/17/12  Yes Nelva Nay, MD    Family History No family history on file.  Social History Social History   Tobacco Use  . Smoking status: Never Smoker  . Smokeless tobacco: Never Used  Substance Use Topics  . Alcohol use: No  . Drug use: No     Allergies   Hydrocodone; Percocet [oxycodone-acetaminophen]; Codeine; and Morphine and related   Review of Systems Review of Systems  Constitutional: Positive for chills. Negative for fever.  Respiratory: Negative for shortness of breath.   Cardiovascular: Negative for chest pain.  Gastrointestinal: Positive for abdominal pain, diarrhea, nausea and vomiting.  Genitourinary: Negative for dysuria and hematuria.  Neurological: Negative for syncope, weakness and numbness.  All other systems reviewed and are negative.   Physical Exam Updated Vital Signs BP 125/85   Pulse (!) 102   Temp 98.1 F (36.7 C) (Oral)   Resp 18   Ht 5' 3.75" (1.619 m)   Wt 93.4 kg   SpO2 100%   BMI 35.64 kg/m  Physical Exam  Constitutional: She appears well-developed and well-nourished.  Non-toxic appearance. No distress.  HENT:  Head: Normocephalic and atraumatic.  Eyes: Conjunctivae are normal. Right eye exhibits no discharge. Left eye exhibits no discharge.  Neck: Neck supple.  Cardiovascular: Normal rate and regular rhythm.  Pulmonary/Chest: Effort normal and breath sounds normal. No respiratory distress. She has no wheezes. She has no rhonchi. She has no rales.  Respiration even and unlabored  Abdominal: Soft. Bowel sounds are normal. She exhibits no distension.  There is tenderness in the left upper quadrant and left lower quadrant. There is no rigidity, no rebound and no guarding.  Neurological: She is alert.  Clear speech.   Skin: Skin is warm and dry. No rash noted.  Psychiatric: She has a normal mood and affect. Her behavior is normal.  Nursing note and vitals reviewed.   ED Treatments / Results  Labs (all labs ordered are listed, but only abnormal results are displayed) Labs Reviewed  CBC WITH DIFFERENTIAL/PLATELET - Abnormal; Notable for the following components:      Result Value   WBC 11.6 (*)    Neutro Abs 9.7 (*)    All other components within normal limits  COMPREHENSIVE METABOLIC PANEL - Abnormal; Notable for the following components:   Glucose, Bld 196 (*)    Total Bilirubin 1.9 (*)    All other components within normal limits  URINALYSIS, ROUTINE W REFLEX MICROSCOPIC - Abnormal; Notable for the following components:   Color, Urine AMBER (*)    APPearance CLOUDY (*)    Specific Gravity, Urine >1.030 (*)    Hgb urine dipstick TRACE (*)    Bilirubin Urine MODERATE (*)    Ketones, ur 15 (*)    Protein, ur 30 (*)    Nitrite POSITIVE (*)    Leukocytes, UA TRACE (*)    All other components within normal limits  URINALYSIS, MICROSCOPIC (REFLEX) - Abnormal; Notable for the following components:   Bacteria, UA MANY (*)    All other components within normal limits  URINE CULTURE  LIPASE, BLOOD    EKG None  Radiology Ct Abdomen Pelvis W Contrast  Result Date: 10/22/2018 CLINICAL DATA:  72 year old female with left abdominal pain since yesterday with nausea vomiting and diarrhea. EXAM: CT ABDOMEN AND PELVIS WITH CONTRAST TECHNIQUE: Multidetector CT imaging of the abdomen and pelvis was performed using the standard protocol following bolus administration of intravenous contrast. CONTRAST:  100mL ISOVUE-300 IOPAMIDOL (ISOVUE-300) INJECTION 61% COMPARISON:  08/13/2018 and earlier. FINDINGS: Lower chest: Stable and negative. No  pericardial or pleural effusion. Hepatobiliary: Surgically absent gallbladder. Stable liver with hepatic steatosis. Pancreas: Negative aside from fatty atrophy. Spleen: Negative. Adrenals/Urinary Tract: Normal adrenal glands. Stable kidneys. Bilateral renal enhancement and contrast excretion is symmetric and within normal limits. Negative course of the left ureter. Diminutive and unremarkable urinary bladder. Stomach/Bowel: Confluent inflammation of the descending colon with wall thickening from the splenic flexure through the sigmoid colon. Confluent inflammatory stranding and/or reactive trace fluid in the gutter at the junction of the descending and sigmoid segments. Diverticulosis in both segments of bowel, more severe in the sigmoid. The rectum appears normal, but the transverse colon also appears featureless throughout its course. There are occasional transverse colon diverticula. The hepatic flexure has a more normal appearance. The cecum is on a lax mesentery located in the right upper quadrant. The appendix is diminutive or absent. The terminal ileum is normal. No dilated small bowel. Small hiatal hernia. Otherwise negative stomach. Negative duodenum aside  from tiny diverticulum in the 3rd portion. No free air. No other free fluid. Vascular/Lymphatic: Calcified aortic atherosclerosis. Major arterial structures are patent. Portal venous system is patent. No lymphadenopathy. Reproductive: Negative. Other: No pelvic free fluid. Musculoskeletal: Stable upper lumbar compression fracture. Osteopenia. Advanced lower lumbar disc, endplate, and posterior element degeneration. No acute osseous abnormality identified. Benign intramuscular lipoma along the anterior right hip musculature redemonstrated (series 2, image 77). IMPRESSION: 1. Acute Colitis most pronounced from the splenic flexure through the sigmoid colon but also likely affecting the transverse colon. Left lower quadrant mesenteric stranding and trace  reactive appearing free fluid, but no abscess or other complicating features. Underlying diverticulosis of the sigmoid and to a lesser extent descending segments. 2. No other acute findings in the abdomen or pelvis. 3. Chronic hepatic steatosis. Electronically Signed   By: Odessa Fleming M.D.   On: 10/22/2018 16:02    Procedures Procedures (including critical care time)  Medications Ordered in ED Medications - No data to display   Initial Impression / Assessment and Plan / ED Course  I have reviewed the triage vital signs and the nursing notes.  Pertinent labs & imaging results that were available during my care of the patient were reviewed by me and considered in my medical decision making (see chart for details).  Clinical Course as of Oct 22 1632  Fri Oct 22, 2018  [id number] 72 year old female with left-sided abdominal pain its been going on for a few days.  She is had a history of diverticulitis and frequent MIP that.  Here she is got some diffuse tenderness throughout her left side of her abdomen without masses guarding or rebound.  CT shows colitis from the transverse in the descending colon.  No obvious abscess.  We will cover her with antibiotics and close follow-up with her PCP.   [MB]    Clinical Course User Index [MB] Terrilee Files, MD   Patient presents to the ED with complaints of abdominal pain. Patient nontoxic appearing, in no apparent distress, vitals without significant abnormality. On exam patient tender to LUQ/LLQ, no peritoneal signs. Will evaluate with labs and CT abdomen/pelvis.  Analgesics, anti-emetics, and fluids administered. Patient would like to avoid all narcotics.    CT scan reveals acute colitis most pronounced from the splenic flexure through the sigmoid colon but also likely affecting the transverse colon. Left lower quadrant mesenteric stranding and trace reactive appearing free fluid, but no abscess or other complicating features. Underlying diverticulosis of the  sigmoid and to a lesser extent descending segments. No other acute findings in the abdomen or pelvis. Labs reviewed. Associated leukocytosis present w/ left shift.  No anemia, no significant electrolyte derangements. LFTs, renal function, and lipase WNL.  Initial plan to DC with cipro/flagyl for colitis however patient adamant that she cannot tolerate flagyl- issues with vomiting in the past even with anti-emetics. Discussed with supervising physician Dr. Charm Barges- recommends  Augmentin for colitis given patient concerns, I am in agreement. Urinalysis consistent with infection, recently treated with keflex, patient without urinary sxs, will send for culture, Augmentin not typical coverage for UTI, but would treat some bacterial flora- adjustment based on culture.   Patient tolerating PO in the emergency department. Will discharge home with Augmentin and Zofran, patient reports she prefers to take tylenol for pain. I discussed results, treatment plan, need for PCP follow-up, and return precautions with the patient. Provided opportunity for questions, patient confirmed understanding and is in agreement with plan.   Findings and  plan of care discussed with supervising physician Dr. Charm Barges who personally evaluated and examined this patient and is in agreement.   Vitals:   10/22/18 1335 10/22/18 1609  BP: 125/85 111/71  Pulse: (!) 102 80  Resp: 18 18  Temp: 98.1 F (36.7 C)   SpO2: 100% 100%    Final Clinical Impressions(s) / ED Diagnoses   Final diagnoses:  Colitis    ED Discharge Orders         Ordered    amoxicillin-clavulanate (AUGMENTIN) 875-125 MG tablet  Every 12 hours     10/22/18 1636    ondansetron (ZOFRAN ODT) 4 MG disintegrating tablet  Every 8 hours PRN     10/22/18 1636           Jens Siems, [place] R, PA-C 10/22/18 1639    Terrilee Files, MD 10/23/18 1513

## 2018-10-22 NOTE — Discharge Instructions (Addendum)
You were seen in the emergency department today for abdominal pain with nausea, vomiting, and diarrhea.  Your CT scan shows findings consistent with colitis, otherwise noticed an infection of the colon.  Please see the attached handout for further information regarding this diagnosis.  We are sending you home with a prescription for Augmentin, an antibiotic, to treat the infection.  We are also send you home with a perception for Zofran to take every 8 hours as needed for nausea and vomiting.  We have prescribed you new medication(s) today. Discuss the medications prescribed today with your pharmacist as they can have adverse effects and interactions with your other medicines including over the counter and prescribed medications. Seek medical evaluation if you start to experience new or abnormal symptoms after taking one of these medicines, seek care immediately if you start to experience difficulty breathing, feeling of your throat closing, facial swelling, or rash as these could be indications of a more serious allergic reaction  Please take Tylenol per over-the-counter dosing instructions to help with pain.  Your urinalysis did show findings consistent with a urinary tract infection, we are culturing her urine, if the antibiotic we have prescribed you for your colitis does not cover possible bacterial infection in your urine we will call you and add an additional antibiotic.  We would like you to follow-up closely with your primary care provider within 3 days.  Please return to the ER should you experience new or worsening symptoms including but not limited to worsening pain, inability to keep fluids down, blood in your stool, blood in your vomit, fever, or any other concerns.

## 2018-10-22 NOTE — ED Triage Notes (Addendum)
Abdominal pain. States she feels the pain is from diverticulitis. She is also being treated for a UTI.

## 2018-10-23 LAB — URINE CULTURE

## 2018-11-26 DIAGNOSIS — E119 Type 2 diabetes mellitus without complications: Secondary | ICD-10-CM

## 2019-01-05 ENCOUNTER — Other Ambulatory Visit: Payer: Self-pay | Admitting: Orthopedic Surgery

## 2019-03-16 ENCOUNTER — Inpatient Hospital Stay (HOSPITAL_COMMUNITY): Admit: 2019-03-16 | Payer: Medicare HMO | Admitting: Orthopedic Surgery

## 2019-03-16 ENCOUNTER — Encounter (HOSPITAL_COMMUNITY): Payer: Self-pay

## 2019-03-16 SURGERY — ARTHROPLASTY, HIP, TOTAL, ANTERIOR APPROACH
Anesthesia: Choice | Laterality: Right

## 2019-05-06 ENCOUNTER — Encounter (HOSPITAL_BASED_OUTPATIENT_CLINIC_OR_DEPARTMENT_OTHER): Payer: Self-pay | Admitting: *Deleted

## 2019-05-06 ENCOUNTER — Emergency Department (HOSPITAL_BASED_OUTPATIENT_CLINIC_OR_DEPARTMENT_OTHER)
Admission: EM | Admit: 2019-05-06 | Discharge: 2019-05-07 | Disposition: A | Discharge status: Other Acute Inpatient Hospital | Payer: Medicare HMO | Attending: Emergency Medicine | Admitting: Emergency Medicine

## 2019-05-06 ENCOUNTER — Emergency Department (HOSPITAL_BASED_OUTPATIENT_CLINIC_OR_DEPARTMENT_OTHER): Payer: Medicare HMO

## 2019-05-06 ENCOUNTER — Other Ambulatory Visit: Payer: Self-pay

## 2019-05-06 DIAGNOSIS — Z79899 Other long term (current) drug therapy: Secondary | ICD-10-CM | POA: Insufficient documentation

## 2019-05-06 DIAGNOSIS — K5792 Diverticulitis of intestine, part unspecified, without perforation or abscess without bleeding: Secondary | ICD-10-CM

## 2019-05-06 DIAGNOSIS — K5732 Diverticulitis of large intestine without perforation or abscess without bleeding: Secondary | ICD-10-CM | POA: Insufficient documentation

## 2019-05-06 DIAGNOSIS — R1032 Left lower quadrant pain: Secondary | ICD-10-CM | POA: Diagnosis present

## 2019-05-06 LAB — URINALYSIS, ROUTINE W REFLEX MICROSCOPIC
Bilirubin Urine: NEGATIVE
Glucose, UA: NEGATIVE mg/dL
Ketones, ur: 15 mg/dL — AB
Nitrite: NEGATIVE
Protein, ur: NEGATIVE mg/dL
Specific Gravity, Urine: 1.015 (ref 1.005–1.030)
pH: 6 (ref 5.0–8.0)

## 2019-05-06 LAB — COMPREHENSIVE METABOLIC PANEL
ALT: 13 U/L (ref 0–44)
AST: 14 U/L — ABNORMAL LOW (ref 15–41)
Albumin: 3.5 g/dL (ref 3.5–5.0)
Alkaline Phosphatase: 97 U/L (ref 38–126)
Anion gap: 9 (ref 5–15)
BUN: 10 mg/dL (ref 8–23)
CO2: 24 mmol/L (ref 22–32)
Calcium: 9.2 mg/dL (ref 8.9–10.3)
Chloride: 101 mmol/L (ref 98–111)
Creatinine, Ser: 0.61 mg/dL (ref 0.44–1.00)
GFR calc Af Amer: >60 mL/min (ref >60)
GFR calc non Af Amer: >60 mL/min (ref >60)
Glucose, Bld: 159 mg/dL — ABNORMAL HIGH (ref 70–99)
Potassium: 3.6 mmol/L (ref 3.5–5.1)
Sodium: 134 mmol/L — ABNORMAL LOW (ref 135–145)
Total Bilirubin: 1.9 mg/dL — ABNORMAL HIGH (ref 0.3–1.2)
Total Protein: 7.2 g/dL (ref 6.5–8.1)

## 2019-05-06 LAB — URINALYSIS, MICROSCOPIC (REFLEX)

## 2019-05-06 LAB — CBC WITH DIFFERENTIAL/PLATELET
Abs Immature Granulocytes: 0.02 10*3/uL (ref 0.00–0.07)
Basophils Absolute: 0 10*3/uL (ref 0.0–0.1)
Basophils Relative: 0 %
Eosinophils Absolute: 0 10*3/uL (ref 0.0–0.5)
Eosinophils Relative: 0 %
HCT: 39.8 % (ref 36.0–46.0)
Hemoglobin: 13.3 g/dL (ref 12.0–15.0)
Immature Granulocytes: 0 %
Lymphocytes Relative: 14 %
Lymphs Abs: 1.3 10*3/uL (ref 0.7–4.0)
MCH: 31.1 pg (ref 26.0–34.0)
MCHC: 33.4 g/dL (ref 30.0–36.0)
MCV: 93 fL (ref 80.0–100.0)
Monocytes Absolute: 0.8 10*3/uL (ref 0.1–1.0)
Monocytes Relative: 9 %
Neutro Abs: 7 10*3/uL (ref 1.7–7.7)
Neutrophils Relative %: 77 %
Platelets: 344 10*3/uL (ref 150–400)
RBC: 4.28 MIL/uL (ref 3.87–5.11)
RDW: 11.7 % (ref 11.5–15.5)
WBC: 9.2 10*3/uL (ref 4.0–10.5)
nRBC: 0 % (ref 0.0–0.2)

## 2019-05-06 LAB — LIPASE, BLOOD: Lipase: 30 U/L (ref 11–51)

## 2019-05-06 MED ORDER — FENTANYL CITRATE (PF) 100 MCG/2ML IJ SOLN
50.0000 ug | Freq: Once | INTRAMUSCULAR | Status: AC
  Administered 2019-05-06: 50 ug via INTRAVENOUS
  Filled 2019-05-06: qty 2

## 2019-05-06 MED ORDER — PIPERACILLIN-TAZOBACTAM 3.375 G IVPB 30 MIN
3.3750 g | Freq: Once | INTRAVENOUS | Status: AC
  Administered 2019-05-06: 3.375 g via INTRAVENOUS
  Filled 2019-05-06: qty 50

## 2019-05-06 MED ORDER — PIPERACILLIN-TAZOBACTAM 3.375 G IVPB
INTRAVENOUS | Status: AC
  Administered 2019-05-06: 3.375 g via INTRAVENOUS
  Filled 2019-05-06: qty 50

## 2019-05-06 MED ORDER — ONDANSETRON HCL 4 MG/2ML IJ SOLN
4.0000 mg | Freq: Once | INTRAMUSCULAR | Status: AC
  Administered 2019-05-06: 4 mg via INTRAVENOUS
  Filled 2019-05-06: qty 2

## 2019-05-06 MED ORDER — PIPERACILLIN-TAZOBACTAM 4.5 G IVPB
4.5000 g | Freq: Once | INTRAVENOUS | Status: DC
  Filled 2019-05-06: qty 100

## 2019-05-06 MED ORDER — CIPROFLOXACIN IN D5W 400 MG/200ML IV SOLN
500.0000 mg | Freq: Once | INTRAVENOUS | Status: DC
  Filled 2019-05-06: qty 400

## 2019-05-06 MED ORDER — IOHEXOL 300 MG/ML  SOLN
100.0000 mL | Freq: Once | INTRAMUSCULAR | Status: AC | PRN
  Administered 2019-05-06: 100 mL via INTRAVENOUS

## 2019-05-06 MED ORDER — METRONIDAZOLE IN NACL 5-0.79 MG/ML-% IV SOLN
500.0000 mg | Freq: Once | INTRAVENOUS | Status: DC
  Filled 2019-05-06: qty 100

## 2019-05-06 MED ORDER — SODIUM CHLORIDE 0.9 % IV BOLUS
1000.0000 mL | Freq: Once | INTRAVENOUS | Status: AC
  Administered 2019-05-06: 1000 mL via INTRAVENOUS

## 2019-05-06 MED ORDER — SODIUM CHLORIDE 0.9 % IV SOLN
INTRAVENOUS | Status: DC | PRN
  Administered 2019-05-06: 1000 mL via INTRAVENOUS

## 2019-05-06 NOTE — ED Notes (Signed)
Contacted [facility] - hospitalist consult

## 2019-05-06 NOTE — ED Notes (Signed)
ED Provider at bedside. 

## 2019-05-06 NOTE — ED Notes (Signed)
Patient transported to CT 

## 2019-05-06 NOTE — ED Notes (Signed)
Contacted [facility]) - they are still locating a bed

## 2019-05-06 NOTE — ED Triage Notes (Signed)
Abdominal pain. LLQ. Pt states the pain feels like it does when she has diverticulitis. Nausea.

## 2019-05-06 NOTE — ED Provider Notes (Signed)
[facility] EMERGENCY DEPARTMENT Provider Note   CSN: [id number] Arrival date & time: 05/06/19  1540    History   Chief Complaint Chief Complaint  Patient presents with   Abdominal Pain    HPI Tracey Morgan is a 73 y.o. female.      Abdominal Pain  Pain location:  LLQ Pain quality: aching   Pain radiates to:  Does not radiate Pain severity:  Mild Onset quality:  Gradual Duration:  1 day Timing:  Constant Progression:  Unchanged Context: not eating, not sick contacts and not suspicious food intake   Relieved by:  Nothing Associated symptoms: nausea and vomiting   Associated symptoms: no chest pain, no chills, no cough, no dysuria, no fever, no hematuria, no shortness of breath and no sore throat     Past Medical History:  Diagnosis Date   Diverticulitis    GERD (gastroesophageal reflux disease)     There are no active problems to display for this patient.   Past Surgical History:  Procedure Laterality Date   BACK SURGERY     CHOLECYSTECTOMY       OB History   No obstetric history on file.      Home Medications    Prior to Admission medications   Medication Sig Start Date End Date Taking? Authorizing Provider  cetirizine (ZYRTEC) 10 MG tablet Take 10 mg by mouth daily.   Yes [provider]  famotidine (PEPCID) 40 MG tablet Take 40 mg by mouth daily.   Yes [provider]  fexofenadine (ALLEGRA) 180 MG tablet daily. 12/15/18  Yes [provider]  fluticasone (FLONASE) 50 MCG/ACT nasal spray Place 2 sprays into the nose daily.   Yes [provider]  HYDROCHLOROTHIAZIDE PO Take by mouth.   Yes [provider]  meclizine (ANTIVERT) 25 MG tablet TAKE 1 2 TO 1 (ONE HALF TO ONE) TABLET BY MOUTH EVERY 6 TO 8 HOURS AS NEEDED 04/04/19  Yes [provider]  amoxicillin-clavulanate (AUGMENTIN) 875-125 MG tablet Take 1 tablet by mouth every 12 (twelve) hours. 10/22/18   Petrucelli, Samantha R,  PA-C  Cephalexin (KEFLEX PO) Take by mouth.    [provider]  METFORMIN HCL PO Take by mouth.    [provider]  ondansetron (ZOFRAN ODT) 4 MG disintegrating tablet Take 1 tablet (4 mg total) by mouth every 8 (eight) hours as needed for nausea or vomiting. 10/22/18   Petrucelli, Samantha R, PA-C  polyethylene glycol (MIRALAX / GLYCOLAX) packet Take 17 g by mouth daily.    [provider]  pseudoephedrine (SUDAFED 12 HOUR) 120 MG 12 hr tablet Take 1 tablet (120 mg total) by mouth every 12 (twelve) hours. 12/17/12   Nelva Nay, MD    Family History No family history on file.  Social History Social History   Tobacco Use   Smoking status: Never Smoker   Smokeless tobacco: Never Used  Substance Use Topics   Alcohol use: No   Drug use: No     Allergies   Hydrocodone; Percocet [oxycodone-acetaminophen]; Codeine; and Morphine and related   Review of Systems Review of Systems  Constitutional: Negative for chills and fever.  HENT: Negative for ear pain and sore throat.   Eyes: Negative for pain and visual disturbance.  Respiratory: Negative for cough and shortness of breath.   Cardiovascular: Negative for chest pain and palpitations.  Gastrointestinal: Positive for abdominal pain, nausea and vomiting.  Genitourinary: Negative for dysuria and hematuria.  Musculoskeletal: Negative  for arthralgias and back pain.  Skin: Negative for color change and rash.  Neurological: Negative for seizures and syncope.  All other systems reviewed and are negative.    Physical Exam Updated Vital Signs  ED Triage Vitals  Enc Vitals Group     BP 05/06/19 1556 139/85     Pulse Rate 05/06/19 1556 97     Resp 05/06/19 1556 20     Temp 05/06/19 1556 98.4 F (36.9 C)     Temp Source 05/06/19 1556 Oral     SpO2 05/06/19 1556 98 %     Weight 05/06/19 1553 202 lb (91.6 kg)     Height 05/06/19 1553 5\' 4"  (1.626 m)     Head Circumference --      Peak Flow --       Pain Score 05/06/19 1553 6     Pain Loc --      Pain Edu? --      Excl. in GC? --     Physical Exam Vitals signs and nursing note reviewed.  Constitutional:      General: She is not in acute distress.    Appearance: She is well-developed. She is not ill-appearing.  HENT:     Head: Normocephalic and atraumatic.  Eyes:     Extraocular Movements: Extraocular movements intact.     Conjunctiva/sclera: Conjunctivae normal.     Pupils: Pupils are equal, round, and reactive to light.  Neck:     Musculoskeletal: Neck supple.  Cardiovascular:     Rate and Rhythm: Normal rate and regular rhythm.     Heart sounds: Normal heart sounds. No murmur.  Pulmonary:     Effort: Pulmonary effort is normal. No respiratory distress.     Breath sounds: Normal breath sounds.  Abdominal:     Palpations: Abdomen is soft.     Tenderness: There is abdominal tenderness in the left lower quadrant. There is no right CVA tenderness, left CVA tenderness, guarding or rebound. Negative signs include Murphy's sign and Rovsing's sign.  Skin:    General: Skin is warm and dry.     Capillary Refill: Capillary refill takes less than 2 seconds.  Neurological:     General: No focal deficit present.     Mental Status: She is alert.      ED Treatments / Results  Labs (all labs ordered are listed, but only abnormal results are displayed) Labs Reviewed  COMPREHENSIVE METABOLIC PANEL - Abnormal; Notable for the following components:      Result Value   Sodium 134 (*)    Glucose, Bld 159 (*)    AST 14 (*)    Total Bilirubin 1.9 (*)    All other components within normal limits  URINALYSIS, ROUTINE W REFLEX MICROSCOPIC - Abnormal; Notable for the following components:   APPearance HAZY (*)    Hgb urine dipstick TRACE (*)    Ketones, ur 15 (*)    Leukocytes,Ua MODERATE (*)    All other components within normal limits  URINALYSIS, MICROSCOPIC (REFLEX) - Abnormal; Notable for the following components:   Bacteria, UA  RARE (*)    All other components within normal limits  CBC WITH DIFFERENTIAL/PLATELET  LIPASE, BLOOD    EKG None  Radiology Ct Abdomen Pelvis W Contrast  Result Date: 05/06/2019 CLINICAL DATA:  LEFT lower quadrant pain. History of diverticulitis. EXAM: CT ABDOMEN AND PELVIS WITH CONTRAST TECHNIQUE: Multidetector CT imaging of the abdomen and pelvis was performed using the standard protocol following  bolus administration of intravenous contrast. CONTRAST:  100mL OMNIPAQUE IOHEXOL 300 MG/ML  SOLN COMPARISON:  CT 10/22/2018 FINDINGS: Lower chest: Lung bases are clear. Hepatobiliary: No focal hepatic lesion. Postcholecystectomy. No biliary dilatation. Pancreas: Lay Spleen: Normal spleen Adrenals/urinary tract: Adrenal glands, kidneys, ureters normal. With high-density material is layering dependently within the bladder. Moderate volume of material which occupies approximately half the bladder volume. Stomach/Bowel: Stomach, duodenum small-bowel normal. The cecum is in the LEFT upper quadrant RIGHT upper quadrant. Appendix not identified. No pericecal inflammation. The ascending transverse colon normal. Beginning in the mid descending colon there is long segment of bowel wall thickening extending to the sigmoid colon. There multiple diverticular through this region. There is pericolonic fat inflammation in the adjacent pericolic gutter and within the colonic mesocolon (images 54 through 63 of series 2). No frank perforation or abscess. Degree of inflammation is moderate to severe. This inflammation approaches the dome of the bladder along sigmoid colon level (image 44/5). There is inflammation also extending to the LEFT adnexa. Vascular/Lymphatic: Abdominal aorta is normal caliber. There is no retroperitoneal or periportal lymphadenopathy. No pelvic lymphadenopathy. Reproductive: Uterus is normal. Ovaries appear normal. There is some inflammation associated LEFT ovary presumed extending from the colonic  inflammation. Other: No free fluid the pelvis. Small amount of fluid along the LEFT pericolic gutter. Musculoskeletal: No aggressive osseous lesion. IMPRESSION: 1. Long segment of colon inflammation involving the descending colon and proximal sigmoid colon. There are multiple diverticula through this region. Inflammation through this same segment of colon on comparison exams from 08/13/2018. 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 No perforation or abscess although the degree of inflammation is moderate to severe. Recommend non emergent GI / surgical consultation for acute on chronic diverticulitis. 2. High-density material within the bladder. Findings concerning for bladder infection. Inflamed sigmoid colon approximates the dome of the bladder. Cannot exclude fistulous communication between the colon and bladder although no gas within the bladder. Recommend clinical correlation for inflammatory bowel disease. Electronically Signed   By: Genevive BiStewart  Edmunds M.D.   On: 05/06/2019 18:21    Procedures Procedures (including critical care time)  Medications Ordered in ED Medications  fentaNYL (SUBLIMAZE) injection 50 mcg (50 mcg Intravenous Refused 05/06/19 1628)  0.9 %  sodium chloride infusion (has no administration in time range)  piperacillin-tazobactam (ZOSYN) 3.375 GM/50ML IVPB (has no administration in time range)  piperacillin-tazobactam (ZOSYN) IVPB 3.375 g (has no administration in time range)  sodium chloride 0.9 % bolus 1,000 mL (1,000 mLs Intravenous New Bag/Given 05/06/19 1639)  ondansetron (ZOFRAN) injection 4 mg (4 mg Intravenous Given 05/06/19 1640)  iohexol (OMNIPAQUE) 300 MG/ML solution 100 mL (100 mLs Intravenous Contrast Given 05/06/19 1715)     Initial Impression / Assessment and Plan / ED Course  I have reviewed the triage vital signs and the nursing notes.  Pertinent labs & imaging results that were available during my care of the patient  were reviewed by me and considered in my medical decision making (see chart for details).     Bethena MidgetMarie Stokes Bhullar is a 73 year old female who presents to the ED with lower abdominal pain.  Patient has history of diverticulitis.  Patient with normal vitals.  No fever.  She states that she has had pain and chills and fever for the last day.  She has had nausea and some vomiting.  She is tender in the left lower quadrant on exam.  Likely diverticulitis.  Will get lab work including  urinalysis, CT abdomen and pelvis.  Possibly could be urinary tract infection.  Will give normal saline bolus, IV fentanyl, IV Zofran.  Patient with no significant anemia, electrolyte abnormality, kidney injury.  No significant leukocytosis.  CT scan showed acute diverticulitis.  There was a question about a possible fistula between the bladder and the colon however patient does not have irritable bowel disease.  Has not had any stool from her urethra.  Possible urinary tract infection on urinalysis.  Patient still fairly symptomatic with nausea and pain.  Therefore will start IV Zosyn and had patient admitted to [facility] to Dr. Alexandria Lodge who accepted the patient in transfer.  She remained hemodynamically stable throughout my care.  Her GI physicians are at [facility] regional who can further evaluate her as well.  This chart was dictated using voice recognition software.  Despite best efforts to proofread,  errors can occur which can change the documentation meaning.    Final Clinical Impressions(s) / ED Diagnoses   Final diagnoses:  Acute diverticulitis    ED Discharge Orders    None       Virgina Norfolk, DO 05/06/19 1941

## 2019-05-06 NOTE — ED Notes (Signed)
Patient asking for pain medications at this time. Provider aware and verbal order to give 50 mcg Fentanyl that was previous refused.

## 2019-05-07 MED ORDER — SODIUM CHLORIDE 0.9 % IV SOLN
INTRAVENOUS | Status: DC
  Administered 2019-05-07: 10:00:00 via INTRAVENOUS

## 2019-05-07 MED ORDER — ACETAMINOPHEN 325 MG PO TABS
650.0000 mg | ORAL_TABLET | Freq: Once | ORAL | Status: AC
  Administered 2019-05-07: 650 mg via ORAL
  Filled 2019-05-07: qty 2

## 2019-05-07 MED ORDER — PIPERACILLIN-TAZOBACTAM 3.375 G IVPB 30 MIN
3.3750 g | Freq: Four times a day (QID) | INTRAVENOUS | Status: DC
  Administered 2019-05-07 (×2): 3.375 g via INTRAVENOUS
  Filled 2019-05-07 (×3): qty 50

## 2019-05-07 NOTE — ED Provider Notes (Signed)
Pt still waiting for a bed at [facility].  When contacted them they are waiting for discharges and she is still on the list.  Pt only has received 1 dose of zosyn at 1950 but needs to be q6 hours so she has missed 1 dose but ordered for q6h.  Pt having a headache but overall well appearing and no further vomiting.  Pt on clears and will await bed placement.   Gwyneth Sprout, MD 05/07/19 [phone]

## 2019-05-07 NOTE — ED Notes (Signed)
Contacted [facility] @ [facility].  They are waiting discharges to assign a bed.

## 2019-12-09 HISTORY — PX: COLON SURGERY: SHX602

## 2019-12-14 DIAGNOSIS — G8929 Other chronic pain: Secondary | ICD-10-CM | POA: Diagnosis not present

## 2019-12-14 DIAGNOSIS — N8110 Cystocele, unspecified: Secondary | ICD-10-CM | POA: Diagnosis not present

## 2019-12-14 DIAGNOSIS — E119 Type 2 diabetes mellitus without complications: Secondary | ICD-10-CM | POA: Diagnosis not present

## 2019-12-14 DIAGNOSIS — R5381 Other malaise: Secondary | ICD-10-CM | POA: Diagnosis not present

## 2019-12-14 DIAGNOSIS — K5669 Other partial intestinal obstruction: Secondary | ICD-10-CM | POA: Diagnosis not present

## 2019-12-14 DIAGNOSIS — E876 Hypokalemia: Secondary | ICD-10-CM | POA: Diagnosis not present

## 2019-12-14 DIAGNOSIS — G8918 Other acute postprocedural pain: Secondary | ICD-10-CM | POA: Diagnosis not present

## 2019-12-14 DIAGNOSIS — R1314 Dysphagia, pharyngoesophageal phase: Secondary | ICD-10-CM | POA: Diagnosis not present

## 2019-12-14 DIAGNOSIS — I050 Rheumatic mitral stenosis: Secondary | ICD-10-CM | POA: Diagnosis not present

## 2019-12-14 DIAGNOSIS — Z7409 Other reduced mobility: Secondary | ICD-10-CM | POA: Diagnosis not present

## 2019-12-14 DIAGNOSIS — R269 Unspecified abnormalities of gait and mobility: Secondary | ICD-10-CM | POA: Diagnosis not present

## 2019-12-14 DIAGNOSIS — R42 Dizziness and giddiness: Secondary | ICD-10-CM | POA: Diagnosis not present

## 2019-12-14 DIAGNOSIS — I10 Essential (primary) hypertension: Secondary | ICD-10-CM | POA: Diagnosis not present

## 2019-12-14 DIAGNOSIS — K5732 Diverticulitis of large intestine without perforation or abscess without bleeding: Secondary | ICD-10-CM | POA: Diagnosis not present

## 2019-12-14 DIAGNOSIS — K56699 Other intestinal obstruction unspecified as to partial versus complete obstruction: Secondary | ICD-10-CM | POA: Diagnosis not present

## 2019-12-14 DIAGNOSIS — Z79899 Other long term (current) drug therapy: Secondary | ICD-10-CM | POA: Diagnosis not present

## 2019-12-14 DIAGNOSIS — K567 Ileus, unspecified: Secondary | ICD-10-CM | POA: Diagnosis not present

## 2019-12-14 DIAGNOSIS — Z7982 Long term (current) use of aspirin: Secondary | ICD-10-CM | POA: Diagnosis not present

## 2019-12-14 DIAGNOSIS — M1990 Unspecified osteoarthritis, unspecified site: Secondary | ICD-10-CM | POA: Diagnosis not present

## 2019-12-14 DIAGNOSIS — K5720 Diverticulitis of large intestine with perforation and abscess without bleeding: Secondary | ICD-10-CM | POA: Diagnosis not present

## 2019-12-14 DIAGNOSIS — K5730 Diverticulosis of large intestine without perforation or abscess without bleeding: Secondary | ICD-10-CM | POA: Diagnosis not present

## 2019-12-14 DIAGNOSIS — E042 Nontoxic multinodular goiter: Secondary | ICD-10-CM | POA: Diagnosis not present

## 2019-12-14 DIAGNOSIS — K9189 Other postprocedural complications and disorders of digestive system: Secondary | ICD-10-CM | POA: Diagnosis not present

## 2019-12-14 DIAGNOSIS — K5190 Ulcerative colitis, unspecified, without complications: Secondary | ICD-10-CM | POA: Diagnosis not present

## 2019-12-14 DIAGNOSIS — K219 Gastro-esophageal reflux disease without esophagitis: Secondary | ICD-10-CM | POA: Diagnosis not present

## 2019-12-14 DIAGNOSIS — M25559 Pain in unspecified hip: Secondary | ICD-10-CM | POA: Diagnosis not present

## 2019-12-14 DIAGNOSIS — K660 Peritoneal adhesions (postprocedural) (postinfection): Secondary | ICD-10-CM | POA: Diagnosis not present

## 2019-12-14 DIAGNOSIS — Z5189 Encounter for other specified aftercare: Secondary | ICD-10-CM | POA: Diagnosis not present

## 2019-12-20 DIAGNOSIS — I050 Rheumatic mitral stenosis: Secondary | ICD-10-CM | POA: Diagnosis not present

## 2019-12-20 DIAGNOSIS — R269 Unspecified abnormalities of gait and mobility: Secondary | ICD-10-CM | POA: Diagnosis not present

## 2019-12-20 DIAGNOSIS — K219 Gastro-esophageal reflux disease without esophagitis: Secondary | ICD-10-CM | POA: Diagnosis not present

## 2019-12-20 DIAGNOSIS — K5792 Diverticulitis of intestine, part unspecified, without perforation or abscess without bleeding: Secondary | ICD-10-CM | POA: Diagnosis not present

## 2019-12-20 DIAGNOSIS — K5732 Diverticulitis of large intestine without perforation or abscess without bleeding: Secondary | ICD-10-CM | POA: Diagnosis not present

## 2019-12-20 DIAGNOSIS — K56600 Partial intestinal obstruction, unspecified as to cause: Secondary | ICD-10-CM | POA: Diagnosis not present

## 2019-12-20 DIAGNOSIS — E1165 Type 2 diabetes mellitus with hyperglycemia: Secondary | ICD-10-CM | POA: Diagnosis not present

## 2019-12-20 DIAGNOSIS — R42 Dizziness and giddiness: Secondary | ICD-10-CM | POA: Diagnosis not present

## 2019-12-20 DIAGNOSIS — R2681 Unsteadiness on feet: Secondary | ICD-10-CM | POA: Diagnosis not present

## 2019-12-20 DIAGNOSIS — E119 Type 2 diabetes mellitus without complications: Secondary | ICD-10-CM | POA: Diagnosis not present

## 2019-12-20 DIAGNOSIS — D62 Acute posthemorrhagic anemia: Secondary | ICD-10-CM | POA: Diagnosis not present

## 2019-12-20 DIAGNOSIS — R5381 Other malaise: Secondary | ICD-10-CM | POA: Diagnosis not present

## 2019-12-20 DIAGNOSIS — E876 Hypokalemia: Secondary | ICD-10-CM | POA: Diagnosis not present

## 2019-12-20 DIAGNOSIS — G8929 Other chronic pain: Secondary | ICD-10-CM | POA: Diagnosis not present

## 2019-12-20 DIAGNOSIS — Z8711 Personal history of peptic ulcer disease: Secondary | ICD-10-CM | POA: Diagnosis not present

## 2019-12-20 DIAGNOSIS — I10 Essential (primary) hypertension: Secondary | ICD-10-CM | POA: Diagnosis not present

## 2019-12-20 DIAGNOSIS — Z7409 Other reduced mobility: Secondary | ICD-10-CM | POA: Diagnosis not present

## 2019-12-20 DIAGNOSIS — K5190 Ulcerative colitis, unspecified, without complications: Secondary | ICD-10-CM | POA: Diagnosis not present

## 2019-12-20 DIAGNOSIS — Z5189 Encounter for other specified aftercare: Secondary | ICD-10-CM | POA: Diagnosis not present

## 2019-12-20 DIAGNOSIS — D649 Anemia, unspecified: Secondary | ICD-10-CM | POA: Diagnosis not present

## 2019-12-20 DIAGNOSIS — K56699 Other intestinal obstruction unspecified as to partial versus complete obstruction: Secondary | ICD-10-CM | POA: Diagnosis not present

## 2019-12-20 DIAGNOSIS — R1314 Dysphagia, pharyngoesophageal phase: Secondary | ICD-10-CM | POA: Diagnosis not present

## 2020-01-16 DIAGNOSIS — E042 Nontoxic multinodular goiter: Secondary | ICD-10-CM | POA: Diagnosis not present

## 2020-01-16 DIAGNOSIS — K5792 Diverticulitis of intestine, part unspecified, without perforation or abscess without bleeding: Secondary | ICD-10-CM | POA: Diagnosis not present

## 2020-01-16 DIAGNOSIS — K5190 Ulcerative colitis, unspecified, without complications: Secondary | ICD-10-CM | POA: Diagnosis not present

## 2020-01-16 DIAGNOSIS — Z6836 Body mass index (BMI) 36.0-36.9, adult: Secondary | ICD-10-CM | POA: Diagnosis not present

## 2020-02-01 DIAGNOSIS — E1140 Type 2 diabetes mellitus with diabetic neuropathy, unspecified: Secondary | ICD-10-CM | POA: Diagnosis not present

## 2020-02-01 DIAGNOSIS — Z79899 Other long term (current) drug therapy: Secondary | ICD-10-CM | POA: Diagnosis not present

## 2020-02-01 DIAGNOSIS — Z03818 Encounter for observation for suspected exposure to other biological agents ruled out: Secondary | ICD-10-CM | POA: Diagnosis not present

## 2020-02-01 DIAGNOSIS — E782 Mixed hyperlipidemia: Secondary | ICD-10-CM | POA: Diagnosis not present

## 2020-02-01 DIAGNOSIS — I6522 Occlusion and stenosis of left carotid artery: Secondary | ICD-10-CM | POA: Diagnosis not present

## 2020-02-01 DIAGNOSIS — D539 Nutritional anemia, unspecified: Secondary | ICD-10-CM | POA: Diagnosis not present

## 2020-02-01 DIAGNOSIS — E041 Nontoxic single thyroid nodule: Secondary | ICD-10-CM | POA: Diagnosis not present

## 2020-02-01 DIAGNOSIS — E559 Vitamin D deficiency, unspecified: Secondary | ICD-10-CM | POA: Diagnosis not present

## 2020-02-19 DIAGNOSIS — M5136 Other intervertebral disc degeneration, lumbar region: Secondary | ICD-10-CM | POA: Diagnosis not present

## 2020-02-19 DIAGNOSIS — J069 Acute upper respiratory infection, unspecified: Secondary | ICD-10-CM | POA: Diagnosis not present

## 2020-02-21 DIAGNOSIS — E782 Mixed hyperlipidemia: Secondary | ICD-10-CM | POA: Diagnosis not present

## 2020-02-21 DIAGNOSIS — J019 Acute sinusitis, unspecified: Secondary | ICD-10-CM | POA: Diagnosis not present

## 2020-02-21 DIAGNOSIS — E1140 Type 2 diabetes mellitus with diabetic neuropathy, unspecified: Secondary | ICD-10-CM | POA: Diagnosis not present

## 2020-02-21 DIAGNOSIS — Z0000 Encounter for general adult medical examination without abnormal findings: Secondary | ICD-10-CM | POA: Diagnosis not present

## 2020-02-21 DIAGNOSIS — R05 Cough: Secondary | ICD-10-CM | POA: Diagnosis not present

## 2020-04-16 DIAGNOSIS — Z86010 Personal history of colonic polyps: Secondary | ICD-10-CM | POA: Diagnosis not present

## 2020-04-16 DIAGNOSIS — Z800 Family history of malignant neoplasm of digestive organs: Secondary | ICD-10-CM | POA: Diagnosis not present

## 2020-04-16 DIAGNOSIS — K219 Gastro-esophageal reflux disease without esophagitis: Secondary | ICD-10-CM | POA: Diagnosis not present

## 2020-04-16 DIAGNOSIS — R1314 Dysphagia, pharyngoesophageal phase: Secondary | ICD-10-CM | POA: Diagnosis not present

## 2020-04-17 DIAGNOSIS — K5792 Diverticulitis of intestine, part unspecified, without perforation or abscess without bleeding: Secondary | ICD-10-CM | POA: Diagnosis not present

## 2020-04-17 DIAGNOSIS — R7302 Impaired glucose tolerance (oral): Secondary | ICD-10-CM | POA: Diagnosis not present

## 2020-04-17 DIAGNOSIS — E042 Nontoxic multinodular goiter: Secondary | ICD-10-CM | POA: Diagnosis not present

## 2020-04-17 DIAGNOSIS — Z6836 Body mass index (BMI) 36.0-36.9, adult: Secondary | ICD-10-CM | POA: Diagnosis not present

## 2020-05-03 DIAGNOSIS — E119 Type 2 diabetes mellitus without complications: Secondary | ICD-10-CM | POA: Diagnosis not present

## 2020-05-03 DIAGNOSIS — I10 Essential (primary) hypertension: Secondary | ICD-10-CM | POA: Diagnosis not present

## 2020-05-03 DIAGNOSIS — I6529 Occlusion and stenosis of unspecified carotid artery: Secondary | ICD-10-CM | POA: Diagnosis not present

## 2020-05-03 DIAGNOSIS — I6523 Occlusion and stenosis of bilateral carotid arteries: Secondary | ICD-10-CM | POA: Diagnosis not present

## 2020-05-07 DIAGNOSIS — E041 Nontoxic single thyroid nodule: Secondary | ICD-10-CM | POA: Diagnosis not present

## 2020-05-07 DIAGNOSIS — Z79899 Other long term (current) drug therapy: Secondary | ICD-10-CM | POA: Diagnosis not present

## 2020-05-07 DIAGNOSIS — D539 Nutritional anemia, unspecified: Secondary | ICD-10-CM | POA: Diagnosis not present

## 2020-05-07 DIAGNOSIS — R072 Precordial pain: Secondary | ICD-10-CM | POA: Diagnosis not present

## 2020-05-07 DIAGNOSIS — E782 Mixed hyperlipidemia: Secondary | ICD-10-CM | POA: Diagnosis not present

## 2020-05-07 DIAGNOSIS — E559 Vitamin D deficiency, unspecified: Secondary | ICD-10-CM | POA: Diagnosis not present

## 2020-05-07 DIAGNOSIS — E1140 Type 2 diabetes mellitus with diabetic neuropathy, unspecified: Secondary | ICD-10-CM | POA: Diagnosis not present

## 2020-05-07 DIAGNOSIS — Z20822 Contact with and (suspected) exposure to covid-19: Secondary | ICD-10-CM | POA: Diagnosis not present

## 2020-05-23 DIAGNOSIS — I10 Essential (primary) hypertension: Secondary | ICD-10-CM | POA: Diagnosis not present

## 2020-07-05 DIAGNOSIS — M25561 Pain in right knee: Secondary | ICD-10-CM | POA: Diagnosis not present

## 2020-07-05 DIAGNOSIS — M25552 Pain in left hip: Secondary | ICD-10-CM | POA: Diagnosis not present

## 2020-07-05 DIAGNOSIS — M1611 Unilateral primary osteoarthritis, right hip: Secondary | ICD-10-CM | POA: Diagnosis not present

## 2020-07-10 DIAGNOSIS — M2560 Stiffness of unspecified joint, not elsewhere classified: Secondary | ICD-10-CM | POA: Diagnosis not present

## 2020-07-10 DIAGNOSIS — M5441 Lumbago with sciatica, right side: Secondary | ICD-10-CM | POA: Diagnosis not present

## 2020-07-10 DIAGNOSIS — G8929 Other chronic pain: Secondary | ICD-10-CM | POA: Diagnosis not present

## 2020-07-10 DIAGNOSIS — Z7409 Other reduced mobility: Secondary | ICD-10-CM | POA: Diagnosis not present

## 2020-07-10 DIAGNOSIS — M25551 Pain in right hip: Secondary | ICD-10-CM | POA: Diagnosis not present

## 2020-07-10 DIAGNOSIS — M1611 Unilateral primary osteoarthritis, right hip: Secondary | ICD-10-CM | POA: Diagnosis not present

## 2020-07-10 DIAGNOSIS — Z789 Other specified health status: Secondary | ICD-10-CM | POA: Diagnosis not present

## 2020-07-10 DIAGNOSIS — M6281 Muscle weakness (generalized): Secondary | ICD-10-CM | POA: Diagnosis not present

## 2020-08-08 DIAGNOSIS — M1611 Unilateral primary osteoarthritis, right hip: Secondary | ICD-10-CM | POA: Diagnosis not present

## 2020-08-08 DIAGNOSIS — G8929 Other chronic pain: Secondary | ICD-10-CM | POA: Diagnosis not present

## 2020-08-08 DIAGNOSIS — M5441 Lumbago with sciatica, right side: Secondary | ICD-10-CM | POA: Diagnosis not present

## 2020-08-08 DIAGNOSIS — M2560 Stiffness of unspecified joint, not elsewhere classified: Secondary | ICD-10-CM | POA: Diagnosis not present

## 2020-08-08 DIAGNOSIS — Z7409 Other reduced mobility: Secondary | ICD-10-CM | POA: Diagnosis not present

## 2020-08-08 DIAGNOSIS — M6281 Muscle weakness (generalized): Secondary | ICD-10-CM | POA: Diagnosis not present

## 2020-08-08 DIAGNOSIS — Z789 Other specified health status: Secondary | ICD-10-CM | POA: Diagnosis not present

## 2020-08-08 DIAGNOSIS — M25551 Pain in right hip: Secondary | ICD-10-CM | POA: Diagnosis not present

## 2020-08-22 ENCOUNTER — Other Ambulatory Visit (HOSPITAL_COMMUNITY): Payer: Medicare HMO

## 2020-08-29 ENCOUNTER — Ambulatory Visit: Admit: 2020-08-29 | Payer: Medicare HMO | Admitting: Orthopedic Surgery

## 2020-08-29 DIAGNOSIS — D539 Nutritional anemia, unspecified: Secondary | ICD-10-CM | POA: Diagnosis not present

## 2020-08-29 DIAGNOSIS — Z20822 Contact with and (suspected) exposure to covid-19: Secondary | ICD-10-CM | POA: Diagnosis not present

## 2020-08-29 DIAGNOSIS — E782 Mixed hyperlipidemia: Secondary | ICD-10-CM | POA: Diagnosis not present

## 2020-08-29 DIAGNOSIS — E041 Nontoxic single thyroid nodule: Secondary | ICD-10-CM | POA: Diagnosis not present

## 2020-08-29 DIAGNOSIS — Z79899 Other long term (current) drug therapy: Secondary | ICD-10-CM | POA: Diagnosis not present

## 2020-08-29 DIAGNOSIS — E1140 Type 2 diabetes mellitus with diabetic neuropathy, unspecified: Secondary | ICD-10-CM | POA: Diagnosis not present

## 2020-08-29 DIAGNOSIS — I6522 Occlusion and stenosis of left carotid artery: Secondary | ICD-10-CM | POA: Diagnosis not present

## 2020-08-29 DIAGNOSIS — E559 Vitamin D deficiency, unspecified: Secondary | ICD-10-CM | POA: Diagnosis not present

## 2020-08-29 SURGERY — ARTHROPLASTY, HIP, TOTAL, ANTERIOR APPROACH
Anesthesia: Choice | Site: Hip | Laterality: Right

## 2020-10-15 DIAGNOSIS — Z7982 Long term (current) use of aspirin: Secondary | ICD-10-CM | POA: Diagnosis not present

## 2020-10-15 DIAGNOSIS — R0600 Dyspnea, unspecified: Secondary | ICD-10-CM | POA: Diagnosis not present

## 2020-10-15 DIAGNOSIS — I059 Rheumatic mitral valve disease, unspecified: Secondary | ICD-10-CM | POA: Diagnosis not present

## 2020-10-24 DIAGNOSIS — K5190 Ulcerative colitis, unspecified, without complications: Secondary | ICD-10-CM | POA: Diagnosis not present

## 2020-10-24 DIAGNOSIS — E042 Nontoxic multinodular goiter: Secondary | ICD-10-CM | POA: Diagnosis not present

## 2020-10-24 DIAGNOSIS — Z6835 Body mass index (BMI) 35.0-35.9, adult: Secondary | ICD-10-CM | POA: Diagnosis not present

## 2020-10-24 DIAGNOSIS — K5792 Diverticulitis of intestine, part unspecified, without perforation or abscess without bleeding: Secondary | ICD-10-CM | POA: Diagnosis not present

## 2020-11-05 DIAGNOSIS — H524 Presbyopia: Secondary | ICD-10-CM | POA: Diagnosis not present

## 2020-11-05 DIAGNOSIS — H43393 Other vitreous opacities, bilateral: Secondary | ICD-10-CM | POA: Diagnosis not present

## 2020-11-05 DIAGNOSIS — H25043 Posterior subcapsular polar age-related cataract, bilateral: Secondary | ICD-10-CM | POA: Diagnosis not present

## 2020-11-05 DIAGNOSIS — H52201 Unspecified astigmatism, right eye: Secondary | ICD-10-CM | POA: Diagnosis not present

## 2020-11-05 DIAGNOSIS — H2513 Age-related nuclear cataract, bilateral: Secondary | ICD-10-CM | POA: Diagnosis not present

## 2020-11-05 DIAGNOSIS — H5212 Myopia, left eye: Secondary | ICD-10-CM | POA: Diagnosis not present

## 2020-11-05 DIAGNOSIS — E119 Type 2 diabetes mellitus without complications: Secondary | ICD-10-CM | POA: Diagnosis not present

## 2020-11-05 DIAGNOSIS — H25013 Cortical age-related cataract, bilateral: Secondary | ICD-10-CM | POA: Diagnosis not present

## 2020-11-05 DIAGNOSIS — H5201 Hypermetropia, right eye: Secondary | ICD-10-CM | POA: Diagnosis not present

## 2020-11-19 ENCOUNTER — Ambulatory Visit (INDEPENDENT_AMBULATORY_CARE_PROVIDER_SITE_OTHER): Payer: PPO | Admitting: Allergy and Immunology

## 2020-11-19 ENCOUNTER — Other Ambulatory Visit: Payer: Self-pay

## 2020-11-19 ENCOUNTER — Encounter: Payer: Self-pay | Admitting: Allergy and Immunology

## 2020-11-19 VITALS — BP 116/76 | HR 76 | Temp 97.6°F | Resp 20 | Ht 61.65 in | Wt 194.2 lb

## 2020-11-19 DIAGNOSIS — L230 Allergic contact dermatitis due to metals: Secondary | ICD-10-CM | POA: Insufficient documentation

## 2020-11-19 NOTE — Progress Notes (Signed)
New Patient Note  RE: Tracey Morgan MRN: [id number] DOB: 02/12/1946 Date of Office Visit: 11/19/2020  Referring provider: Caffie Damme, MD Primary care provider: Caffie Damme, MD  Chief Complaint: Allergic Reaction   History of present illness: Tracey Morgan is a 74 y.o. female seen today in consultation requested by Caffie Damme, MD.  She is planning to have right total hip arthroplasty in January February.  She has a history of rashes with inexpensive jewelry, particularly jewelry containing nickel, such as rings, earring posts, and necklaces.  She also reports that she had some inflammation from surgical staples in January 2021.  She is here for metal allergy testing today.  Assessment and plan: Allergic contact dermatitis due to metals The patients history suggests metal contact dermatitis. We will proceed with patch testing.  Metal panel patch test has been placed. Instructions have been provided for keeping the area clean and dry.  The patient has been asked to return at appropriate intervals for patch test interpretation.   Diagnostics: Metal patch tests have been placed.  Physical examination: Blood pressure 116/76, pulse 76, temperature 97.6 F (36.4 C), temperature source Temporal, resp. rate 20, height 5' 1.65" (1.566 m), weight 194 lb 3.2 oz (88.1 kg), SpO2 100 %.  General: Alert, interactive, in no acute distress. Neck: Supple without lymphadenopathy. Lungs: Clear to auscultation without wheezing, rhonchi or rales. CV: Normal S1, S2 without murmurs. Abdomen: Nondistended, nontender. Skin: Warm and dry, without lesions or rashes. Extremities:  No clubbing, cyanosis or edema. Neuro:   Grossly intact.  Review of systems:  Review of systems negative except as noted in HPI / PMHx or noted below: Review of Systems  Constitutional: Negative.   HENT: Negative.   Eyes: Negative.   Respiratory: Negative.   Cardiovascular: Negative.   Gastrointestinal:  Negative.   Genitourinary: Negative.   Musculoskeletal: Negative.   Skin: Negative.   Neurological: Negative.   Endo/Heme/Allergies: Negative.   Psychiatric/Behavioral: Negative.     Past medical history:  Past Medical History:  Diagnosis Date  . Diverticulitis   . Eczema   . GERD (gastroesophageal reflux disease)     Past surgical history:  Past Surgical History:  Procedure Laterality Date  . ADENOIDECTOMY    . APPENDECTOMY  1979  . BACK SURGERY    . CESAREAN SECTION  1981  . CHOLECYSTECTOMY    . GALLBLADDER SURGERY  1979  . TONSILLECTOMY      Family history: Family History  Problem Relation Age of Onset  . Angioedema Father   . Eczema Sister   . Immunodeficiency Sister   . Food Allergy Niece     Social history: Social History   Socioeconomic History  . Marital status: Divorced    Spouse name: Not on file  . Number of children: Not on file  . Years of education: Not on file  . Highest education level: Not on file  Occupational History  . Not on file  Tobacco Use  . Smoking status: Never Smoker  . Smokeless tobacco: Never Used  Vaping Use  . Vaping Use: Never used  Substance and Sexual Activity  . Alcohol use: No  . Drug use: No  . Sexual activity: Never  Other Topics Concern  . Not on file  Social History Narrative  . Not on file   Social Determinants of Health   Financial Resource Strain: Not on file  Food Insecurity: Not on file  Transportation Needs: Not on file  Physical Activity: Not  on file  Stress: Not on file  Social Connections: Not on file  Intimate Partner Violence: Not on file    Environmental History: The patient lives in a townhouse built in [place] with carpeting in the bedroom and central air/heat.  There is no known mold/water damage in the home.  There are no pets in the home.  She is a non-smoker.  Current Outpatient Medications  Medication Sig Dispense Refill  . cetirizine (ZYRTEC) 10 MG tablet Take 10 mg by mouth daily.     Marland Kitchen Cinnamon (HM CINNAMON) 500 MG capsule Take 500 mg by mouth daily.    . cyanocobalamin 100 MCG tablet Take by mouth daily.    . ergocalciferol (VITAMIN D2) 1.25 MG (50000 UT) capsule daily.    . famotidine (PEPCID) 40 MG tablet Take 40 mg by mouth daily.    . fluticasone (FLONASE) 50 MCG/ACT nasal spray Place 2 sprays into the nose daily.    Marland Kitchen HYDROCHLOROTHIAZIDE PO Take 12.5 mg by mouth 3 (three) times a week.    . iron polysaccharides (NIFEREX) 150 MG capsule Take by mouth.    . polyethylene glycol (MIRALAX / GLYCOLAX) packet Take 17 g by mouth as needed.    . traMADol (ULTRAM) 50 MG tablet Take by mouth at bedtime.     No current facility-administered medications for this visit.    Known medication allergies: Allergies  Allergen Reactions  . Hydralazine Anaphylaxis and Nausea Only    Patient had nausea and headache with po hydralazine 10mg    . Metformin Other (See Comments)    Severe colitis.  Hydrocodone   . Metronidazole Other (See Comments)    Not specified  . Percocet [Oxycodone-Acetaminophen]   . Buprenorphine Hcl Nausea And Vomiting, Rash and Other (See Comments)    hallucinations   . Codeine Nausea And Vomiting, Rash and Other (See Comments)    Hallucinations  . Morphine And Related Nausea And Vomiting, Rash and Other (See Comments)    hallucinations    I appreciate the opportunity to take part in Tracey Morgan's care. Please do not hesitate to contact me with questions.  Sincerely,   R. Marland Kitchen, MD

## 2020-11-19 NOTE — Assessment & Plan Note (Signed)
The patients history suggests metal contact dermatitis. We will proceed with patch testing.  Metal panel patch test has been placed. Instructions have been provided for keeping the area clean and dry.  The patient has been asked to return at appropriate intervals for patch test interpretation. 

## 2020-11-19 NOTE — Patient Instructions (Addendum)
Allergic contact dermatitis due to metals The patients history suggests metal contact dermatitis. We will proceed with patch testing.  Metal panel patch test has been placed. Instructions have been provided for keeping the area clean and dry.  The patient has been asked to return at appropriate intervals for patch test interpretation.   Return in 2 days (on 11/21/2020), or patch test interpretation.

## 2020-11-21 ENCOUNTER — Other Ambulatory Visit: Payer: Self-pay

## 2020-11-21 ENCOUNTER — Encounter: Payer: Self-pay | Admitting: Family Medicine

## 2020-11-21 ENCOUNTER — Ambulatory Visit: Payer: PPO | Admitting: Family Medicine

## 2020-11-21 DIAGNOSIS — L230 Allergic contact dermatitis due to metals: Secondary | ICD-10-CM

## 2020-11-21 NOTE — Progress Notes (Signed)
    Follow-up Note  RE: Ambri Miltner MRN: [id number] DOB: 03-04-1946 Date of Office Visit: 11/21/2020  Primary care provider: Caffie Damme, MD Referring provider: Caffie Damme, MD   Renda returns to the office today for the initial patch test interpretation, given suspected history of contact dermatitis.   Diagnostics: Metal patch reading: 48 hours Metals Patch     Time Antigen Placed  11/19/2020    Location  Back    Number of Test  12    Reading Interval  Day    Chromium chloride 1%  0     Cobalt chloride hexahydrate 1%  0     Molybdenum chloride 0.5%  0     Nickel sulfate hexahydrate 5%  0     Potassium dichromate 0.25%  0     Copper sulfate pentahydrate 2%  0     Titanium 0.1%  0     Manganese chloride 0.5%  0     Tantal 1%  0    Vanadium pentoxide 10%  0   Aluminum hydroxide 10%  0    Nickel sulfate hexahydrate 5%  0     Paper clip  0    Plan:  Allergic contact dermatitis Metals patch reading: 48 hours is negative to the metals panel and paper clip.  Care of skin between now and follow up visit discussed Return to the clinic on Friday for the final reading  Call the clinic if this treatment plan is not working well for you  Follow up in 2 days (Friday) or sooner if needed.  Thank you for the opportunity to care for this patient.  Please do not hesitate to contact me with questions.  Thermon Leyland, FNP [facility]

## 2020-11-21 NOTE — Patient Instructions (Signed)
  Allergic contact dermatitis Metals patch reading: 48 hours is negative to the metals panel and paper clip.  Care of skin between now and follow up visit discussed Return to the clinic on Friday for the final reading  Call the clinic if this treatment plan is not working well for you  Follow up in 2 days (Friday) or sooner if needed.

## 2020-11-23 ENCOUNTER — Other Ambulatory Visit: Payer: Self-pay

## 2020-11-23 ENCOUNTER — Ambulatory Visit (INDEPENDENT_AMBULATORY_CARE_PROVIDER_SITE_OTHER): Payer: PPO | Admitting: Family Medicine

## 2020-11-23 ENCOUNTER — Encounter: Payer: Self-pay | Admitting: Family Medicine

## 2020-11-23 DIAGNOSIS — L230 Allergic contact dermatitis due to metals: Secondary | ICD-10-CM | POA: Diagnosis not present

## 2020-11-23 NOTE — Progress Notes (Signed)
    Follow-up Note  RE: Tracey Morgan MRN: [id number] DOB: 1946/06/04 Date of Office Visit: 11/23/2020  Primary care provider: Caffie Damme, MD Referring provider: Caffie Damme, MD   [name] returns to the office today for the final patch test interpretation, given suspected history of contact dermatitis.    Diagnostics:   TRUE TEST 96-hour hour reading: Negative to the metals patch testing strip and negative to paper clip.   Plan:   Allergic contact dermatitis Metals patch reading: 96 hours is negative to the metals panel and paper clip.   Call the clinic if this treatment plan is not working well for you  Follow up as needed or sooner if needed.  Thank you for the opportunity to care for this patient.  Please do not hesitate to contact me with questions.  Thermon Leyland, FNP [facility]

## 2020-11-23 NOTE — Patient Instructions (Signed)
  Allergic contact dermatitis Metals patch reading: 96 hours is negative to the metals panel and paper clip.   Call the clinic if this treatment plan is not working well for you  Follow up as needed or sooner if needed.

## 2020-11-27 DIAGNOSIS — E042 Nontoxic multinodular goiter: Secondary | ICD-10-CM | POA: Diagnosis not present

## 2020-12-04 DIAGNOSIS — Z1231 Encounter for screening mammogram for malignant neoplasm of breast: Secondary | ICD-10-CM | POA: Diagnosis not present

## 2021-01-07 DIAGNOSIS — E042 Nontoxic multinodular goiter: Secondary | ICD-10-CM | POA: Diagnosis not present

## 2021-01-07 DIAGNOSIS — E041 Nontoxic single thyroid nodule: Secondary | ICD-10-CM | POA: Diagnosis not present

## 2021-02-14 DIAGNOSIS — Z1331 Encounter for screening for depression: Secondary | ICD-10-CM | POA: Diagnosis not present

## 2021-02-14 DIAGNOSIS — D539 Nutritional anemia, unspecified: Secondary | ICD-10-CM | POA: Diagnosis not present

## 2021-02-14 DIAGNOSIS — I4510 Unspecified right bundle-branch block: Secondary | ICD-10-CM | POA: Diagnosis not present

## 2021-02-14 DIAGNOSIS — R0602 Shortness of breath: Secondary | ICD-10-CM | POA: Diagnosis not present

## 2021-02-14 DIAGNOSIS — M160 Bilateral primary osteoarthritis of hip: Secondary | ICD-10-CM | POA: Diagnosis not present

## 2021-02-14 DIAGNOSIS — Z6835 Body mass index (BMI) 35.0-35.9, adult: Secondary | ICD-10-CM | POA: Diagnosis not present

## 2021-02-14 DIAGNOSIS — E041 Nontoxic single thyroid nodule: Secondary | ICD-10-CM | POA: Diagnosis not present

## 2021-02-14 DIAGNOSIS — E782 Mixed hyperlipidemia: Secondary | ICD-10-CM | POA: Diagnosis not present

## 2021-02-14 DIAGNOSIS — R7302 Impaired glucose tolerance (oral): Secondary | ICD-10-CM | POA: Diagnosis not present

## 2021-02-14 DIAGNOSIS — Z0000 Encounter for general adult medical examination without abnormal findings: Secondary | ICD-10-CM | POA: Diagnosis not present

## 2021-02-14 DIAGNOSIS — Z1339 Encounter for screening examination for other mental health and behavioral disorders: Secondary | ICD-10-CM | POA: Diagnosis not present

## 2021-02-14 DIAGNOSIS — Z79899 Other long term (current) drug therapy: Secondary | ICD-10-CM | POA: Diagnosis not present

## 2021-02-14 DIAGNOSIS — Z1159 Encounter for screening for other viral diseases: Secondary | ICD-10-CM | POA: Diagnosis not present

## 2021-02-14 DIAGNOSIS — E559 Vitamin D deficiency, unspecified: Secondary | ICD-10-CM | POA: Diagnosis not present

## 2021-02-19 DIAGNOSIS — M8580 Other specified disorders of bone density and structure, unspecified site: Secondary | ICD-10-CM | POA: Diagnosis not present

## 2021-03-08 DIAGNOSIS — R319 Hematuria, unspecified: Secondary | ICD-10-CM | POA: Diagnosis not present

## 2021-03-08 DIAGNOSIS — R3129 Other microscopic hematuria: Secondary | ICD-10-CM | POA: Diagnosis not present

## 2021-03-08 HISTORY — PX: HIP SURGERY: SHX245

## 2021-03-15 DIAGNOSIS — E782 Mixed hyperlipidemia: Secondary | ICD-10-CM | POA: Diagnosis not present

## 2021-03-15 DIAGNOSIS — I4510 Unspecified right bundle-branch block: Secondary | ICD-10-CM | POA: Diagnosis not present

## 2021-03-15 DIAGNOSIS — E041 Nontoxic single thyroid nodule: Secondary | ICD-10-CM | POA: Diagnosis not present

## 2021-03-15 DIAGNOSIS — Z6834 Body mass index (BMI) 34.0-34.9, adult: Secondary | ICD-10-CM | POA: Diagnosis not present

## 2021-03-15 DIAGNOSIS — R300 Dysuria: Secondary | ICD-10-CM | POA: Diagnosis not present

## 2021-03-19 ENCOUNTER — Encounter (HOSPITAL_COMMUNITY): Admission: RE | Admit: 2021-03-19 | Payer: PPO | Source: Ambulatory Visit

## 2021-03-20 NOTE — Patient Instructions (Addendum)
DUE TO COVID-19 ONLY ONE VISITOR IS ALLOWED TO COME WITH YOU AND STAY IN THE WAITING ROOM ONLY DURING PRE OP AND PROCEDURE DAY OF SURGERY. THE 1 VISITOR  MAY VISIT WITH YOU AFTER SURGERY IN YOUR PRIVATE ROOM DURING VISITING HOURS ONLY!  YOU NEED TO HAVE A COVID 19 TEST ON__4/18_____ @_1 :30______, THIS TEST MUST BE DONE BEFORE SURGERY,  COVID TESTING SITE [address] [place] , IT IS ON THE RIGHT GOING OUT [address] APPROXIMATELY  2 MINUTES PAST [employer] ON THE RIGHT. ONCE YOUR COVID TEST IS COMPLETED,  PLEASE BEGIN THE QUARANTINE INSTRUCTIONS AS OUTLINED IN YOUR HANDOUT.                [id number]    Your procedure is scheduled on: 03/27/21   Report to [facility] Main  Entrance   Report to admitting at   12:00 PM     Call this number if you have problems the morning of surgery [phone]    BRUSH YOUR TEETH MORNING OF SURGERY AND RINSE YOUR MOUTH OUT, NO CHEWING GUM CANDY OR MINTS.   No food after midnight.    You may have clear liquid until  11:30 AM      CLEAR LIQUID DIET   Foods Allowed                                                                     Foods Excluded  Coffee and tea, regular and decaf                             liquids that you cannot  Plain Jell-O any favor except red or purple                                           see through such as: Fruit ices (not with fruit pulp)                                     milk, soups, orange juice  Iced Popsicles                                    All solid food Carbonated beverages, regular and diet                                    Cranberry, grape and apple juices Sports drinks like Gatorade Lightly seasoned clear broth or consume(fat free) Sugar, honey syrup      Nothing by mouth after 11:30 AM   Take these medicines the morning of surgery with A SIP OF WATER:  none                                 You may not have any metal on  your body including  hair pins and              piercings  Do not wear jewelry, make-up, lotions, powders or perfumes, deodorant             Do not wear nail polish on your fingernails.  Do not shave  48 hours prior to surgery.  .   Do not bring valuables to the hospital. [place] IS NOT             RESPONSIBLE   FOR VALUABLES.  Contacts, dentures or bridgework may not be worn into surgery.        Name and phone number of your driver:  Special Instructions: N/A              Please read over the following fact sheets you were given: _____________________________________________________________________             [facility] - Preparing for Surgery Before surgery, you can play an important role.  Because skin is not sterile, your skin needs to be as free of germs as possible.  You can reduce the number of germs on your skin by washing with CHG (chlorahexidine gluconate) soap before surgery.  CHG is an antiseptic cleaner which kills germs and bonds with the skin to continue killing germs even after washing. Please DO NOT use if you have an allergy to CHG or antibacterial soaps.  If your skin becomes reddened/irritated stop using the CHG and inform your nurse when you arrive at Short Stay. Do not shave (including legs and underarms) for at least 48 hours prior to the first CHG shower.    Please follow these instructions carefully:  1.  Shower with CHG Soap the night before surgery and the  morning of Surgery.  2.  If you choose to wash your hair, wash your hair first as usual with your  normal  shampoo.  3.  After you shampoo, rinse your hair and body thoroughly to remove the  shampoo.                                        4.  Use CHG as you would any other liquid soap.  You can apply chg directly  to the skin and wash                       Gently with a scrungie or clean washcloth.  5.  Apply the CHG Soap to your body ONLY FROM THE NECK DOWN.   Do not use on face/ open                           Wound  or open sores. Avoid contact with eyes, ears mouth and genitals (private parts).                       Wash face,  Genitals (private parts) with your normal soap.             6.  Wash thoroughly, paying special attention to the area where your surgery  will be performed.  7.  Thoroughly rinse your body with warm water from the neck down.  8.  DO NOT shower/wash with your normal soap after using and rinsing off  the CHG Soap.  9.  Pat yourself dry with a clean towel.            10.  Wear clean pajamas.            11.  Place clean sheets on your bed the night of your first shower and do not  sleep with pets. Day of Surgery : Do not apply any lotions/deodorants the morning of surgery.  Please wear clean clothes to the hospital/surgery center.  FAILURE TO FOLLOW THESE INSTRUCTIONS MAY RESULT IN THE CANCELLATION OF YOUR SURGERY PATIENT SIGNATURE_________________________________  NURSE SIGNATURE__________________________________  ________________________________________________________________________   Tracey Morgan  An incentive spirometer is a tool that can help keep your lungs clear and active. This tool measures how well you are filling your lungs with each breath. Taking long deep breaths may help reverse or decrease the chance of developing breathing (pulmonary) problems (especially infection) following:  A long period of time when you are unable to move or be active. BEFORE THE PROCEDURE   If the spirometer includes an indicator to show your best effort, your nurse or respiratory therapist will set it to a desired goal.  If possible, sit up straight or lean slightly forward. Try not to slouch.  Hold the incentive spirometer in an upright position. INSTRUCTIONS FOR USE  1. Sit on the edge of your bed if possible, or sit up as far as you can in bed or on a chair. 2. Hold the incentive spirometer in an upright position. 3. Breathe out normally. 4. Place the  mouthpiece in your mouth and seal your lips tightly around it. 5. Breathe in slowly and as deeply as possible, raising the piston or the ball toward the top of the column. 6. Hold your breath for 3-5 seconds or for as long as possible. Allow the piston or ball to fall to the bottom of the column. 7. Remove the mouthpiece from your mouth and breathe out normally. 8. Rest for a few seconds and repeat Steps 1 through 7 at least 10 times every 1-2 hours when you are awake. Take your time and take a few normal breaths between deep breaths. 9. The spirometer may include an indicator to show your best effort. Use the indicator as a goal to work toward during each repetition. 10. After each set of 10 deep breaths, practice coughing to be sure your lungs are clear. If you have an incision (the cut made at the time of surgery), support your incision when coughing by placing a pillow or rolled up towels firmly against it. Once you are able to get out of bed, walk around indoors and cough well. You may stop using the incentive spirometer when instructed by your caregiver.  RISKS AND COMPLICATIONS  Take your time so you do not get dizzy or light-headed.  If you are in pain, you may need to take or ask for pain medication before doing incentive spirometry. It is harder to take a deep breath if you are having pain. AFTER USE  Rest and breathe slowly and easily.  It can be helpful to keep track of a log of your progress. Your caregiver can provide you with a simple table to help with this. If you are using the spirometer at home, follow these instructions: [place] IF:   You are having difficultly using the spirometer.  You have trouble using the spirometer as often as instructed.  Your pain medication is not giving enough relief while using the spirometer.  You develop  fever of 100.5 F (38.1 C) or higher. SEEK IMMEDIATE MEDICAL CARE IF:   You cough up bloody sputum that had not been present  before.  You develop fever of 102 F (38.9 C) or greater.  You develop worsening pain at or near the incision site. MAKE SURE YOU:   Understand these instructions.  Will watch your condition.  Will get help right away if you are not doing well or get worse. Document Released: 04/06/2007 Document Revised: 02/16/2012 Document Reviewed: 06/07/2007 [facility] Patient Information 2014 [place], [place].   ________________________________________________________________________

## 2021-03-21 ENCOUNTER — Other Ambulatory Visit: Payer: Self-pay

## 2021-03-21 ENCOUNTER — Encounter (HOSPITAL_COMMUNITY)
Admission: RE | Admit: 2021-03-21 | Discharge: 2021-03-21 | Disposition: A | Discharge status: Home / Self Care | Payer: PPO | Source: Ambulatory Visit | Attending: Orthopedic Surgery | Admitting: Orthopedic Surgery

## 2021-03-21 ENCOUNTER — Encounter (HOSPITAL_COMMUNITY): Payer: Self-pay

## 2021-03-21 DIAGNOSIS — Z01818 Encounter for other preprocedural examination: Secondary | ICD-10-CM | POA: Insufficient documentation

## 2021-03-21 HISTORY — DX: Family history of other specified conditions: Z84.89

## 2021-03-21 HISTORY — DX: Other specified postprocedural states: Z98.890

## 2021-03-21 HISTORY — DX: Personal history of urinary calculi: Z87.442

## 2021-03-21 HISTORY — DX: Unspecified osteoarthritis, unspecified site: M19.90

## 2021-03-21 HISTORY — DX: Myoneural disorder, unspecified: G70.9

## 2021-03-21 HISTORY — DX: Prediabetes: R73.03

## 2021-03-21 HISTORY — DX: Cardiac murmur, unspecified: R01.1

## 2021-03-21 HISTORY — DX: Other complications of anesthesia, initial encounter: T88.59XA

## 2021-03-21 HISTORY — DX: Nausea with vomiting, unspecified: R11.2

## 2021-03-21 LAB — PROTIME-INR
INR: 0.9 (ref 0.8–1.2)
Prothrombin Time: 12.6 seconds (ref 11.4–15.2)

## 2021-03-21 LAB — HEMOGLOBIN A1C
Hgb A1c MFr Bld: 5.6 % (ref 4.8–5.6)
Mean Plasma Glucose: 114.02 mg/dL

## 2021-03-21 LAB — COMPREHENSIVE METABOLIC PANEL
ALT: 23 U/L (ref 0–44)
AST: 22 U/L (ref 15–41)
Albumin: 4 g/dL (ref 3.5–5.0)
Alkaline Phosphatase: 112 U/L (ref 38–126)
Anion gap: 7 (ref 5–15)
BUN: 17 mg/dL (ref 8–23)
CO2: 27 mmol/L (ref 22–32)
Calcium: 9.5 mg/dL (ref 8.9–10.3)
Chloride: 105 mmol/L (ref 98–111)
Creatinine, Ser: 0.71 mg/dL (ref 0.44–1.00)
GFR, Estimated: >60 mL/min (ref >60)
Glucose, Bld: 136 mg/dL — ABNORMAL HIGH (ref 70–99)
Potassium: 4.3 mmol/L (ref 3.5–5.1)
Sodium: 139 mmol/L (ref 135–145)
Total Bilirubin: 1.2 mg/dL (ref 0.3–1.2)
Total Protein: 7 g/dL (ref 6.5–8.1)

## 2021-03-21 LAB — CBC
HCT: 42.1 % (ref 36.0–46.0)
Hemoglobin: 13.9 g/dL (ref 12.0–15.0)
MCH: 31.5 pg (ref 26.0–34.0)
MCHC: 33 g/dL (ref 30.0–36.0)
MCV: 95.5 fL (ref 80.0–100.0)
Platelets: 322 10*3/uL (ref 150–400)
RBC: 4.41 MIL/uL (ref 3.87–5.11)
RDW: 11.9 % (ref 11.5–15.5)
WBC: 4.7 10*3/uL (ref 4.0–10.5)
nRBC: 0 % (ref 0.0–0.2)

## 2021-03-21 LAB — APTT: aPTT: 29 seconds (ref 24–36)

## 2021-03-21 LAB — SURGICAL PCR SCREEN
MRSA, PCR: NEGATIVE
Staphylococcus aureus: NEGATIVE

## 2021-03-21 NOTE — Progress Notes (Signed)
COVID Vaccine Completed:yes Date COVID Vaccine completed:05/2020- had high BP with shot  COVID vaccine manufacturer: Pfizer     PCP - Dr. Kevin Fenton Cardiologist - Dr. Desma Maxim [facility]  Chest x-ray -no  EKG - 03/21/21-epic Stress Test - 10/03/19 -care everywhere ECHO - 9/20120-care everywhere Cardiac Cath - no Pacemaker/ICD device last checked:NA  Sleep Study - no CPAP -   Fasting Blood Sugar - NA Checks Blood Sugar _____ times a day  Blood Thinner Instructions:NA Aspirin Instructions:NA Last Dose:  Anesthesia review:   Patient denies shortness of breath, fever, cough and chest pain at PAT appointment yes  Patient verbalized understanding of instructions that were given to them at the PAT appointment. Patient was also instructed that they will need to review over the PAT instructions again at home before surgery.yes Pt has lost wt and no longer needs BP meds. She does get SOB climbing stairs because of deconditioning and pain but no SOB with ADLs

## 2021-03-25 ENCOUNTER — Other Ambulatory Visit (HOSPITAL_COMMUNITY)
Admission: RE | Admit: 2021-03-25 | Discharge: 2021-03-25 | Disposition: A | Discharge status: Home / Self Care | Payer: PPO | Source: Ambulatory Visit | Attending: Orthopedic Surgery | Admitting: Orthopedic Surgery

## 2021-03-25 DIAGNOSIS — Z01812 Encounter for preprocedural laboratory examination: Secondary | ICD-10-CM | POA: Diagnosis not present

## 2021-03-25 DIAGNOSIS — Z20822 Contact with and (suspected) exposure to covid-19: Secondary | ICD-10-CM | POA: Insufficient documentation

## 2021-03-26 LAB — SARS CORONAVIRUS 2 (TAT 6-24 HRS): SARS Coronavirus 2: NEGATIVE

## 2021-03-26 NOTE — Anesthesia Preprocedure Evaluation (Addendum)
Anesthesia Evaluation  Patient identified by MRN, date of birth, ID band Patient awake    Reviewed: Allergy & Precautions, NPO status , Patient's Chart, lab work & pertinent test results  History of Anesthesia Complications (+) PONV  Airway Mallampati: II  TM Distance: >3 FB Neck ROM: Full    Dental  (+) Dental Advisory Given   Pulmonary neg pulmonary ROS,    breath sounds clear to auscultation       Cardiovascular negative cardio ROS   Rhythm:Regular Rate:Normal     Neuro/Psych  Neuromuscular disease    GI/Hepatic Neg liver ROS, GERD  ,  Endo/Other  negative endocrine ROS  Renal/GU negative Renal ROS     Musculoskeletal  (+) Arthritis ,   Abdominal   Peds  Hematology negative hematology ROS (+)   Anesthesia Other Findings   Reproductive/Obstetrics                             Lab Results  Component Value Date   WBC 4.7 03/21/2021   HGB 13.9 03/21/2021   HCT 42.1 03/21/2021   MCV 95.5 03/21/2021   PLT 322 03/21/2021   Lab Results  Component Value Date   CREATININE 0.71 03/21/2021   BUN 17 03/21/2021   NA 139 03/21/2021   K 4.3 03/21/2021   CL 105 03/21/2021   CO2 27 03/21/2021    Anesthesia Physical Anesthesia Plan  ASA: II  Anesthesia Plan: Spinal   Post-op Pain Management:    Induction:   PONV Risk Score and Plan: 3 and Propofol infusion, Dexamethasone, Ondansetron and Treatment may vary due to age or medical condition  Airway Management Planned: Natural Airway and Simple Face Mask  Additional Equipment:   Intra-op Plan:   Post-operative Plan:   Informed Consent: I have reviewed the patients History and Physical, chart, labs and discussed the procedure including the risks, benefits and alternatives for the proposed anesthesia with the patient or authorized representative who has indicated his/her understanding and acceptance.       Plan Discussed with:  CRNA  Anesthesia Plan Comments: (PAT note by Antionette Poles, PA-C:  Patient was previously followed by cardiology at [facility] for history of DOE and mitral valve disorder (reported history of mitral stenosis, however echo 08/2019 showed no evidence of this).  She was last seen by Dr. Desma Maxim 10/15/2020 and at that time was advised to follow-up with cardiology as needed.  In his note, however, he did address upcoming hip replacement.  Per note, "On follow up to day, Tracey Morgan reports that she is not had any issues. Her blood pressure is 124/90 heart rate is 98 bpm oxygen saturations 99% height is 5 foot 2 inches weight 194.8 pounds. Despite the patient's history of mitral stenosis the echocardiogram from 9/20 showed only mitral annular calcification no mitral regurgitation no MS. He had a negative nuclear stress test 1 year ago. She does continue long-term aspirin therapy. Patient tells me that she is going to plan to have her hip done in December. 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 She is not on any medications that we have prescribed. I have discussed with her that at this point we would follow her on a as needed basis. She does not need any further testing prior to her hip surgery."  Preop labs reviewed, unremarkable.  EKG 03/21/2021: NSR.  Rate 76.  Nuclear stress 10/03/2019 (Care Everywhere): Summary  1. Good quality study.  2. Normal Lexiscan stress EKG.  3. Normal resting myocardial perfusion with no evidence of old infarct.  4. Normal stress myocardial perfusion with no evidence of ischemia.  5. Normal LV systolic function, EF 80%   TTE 08/29/2019 (Care Everywhere): Summary  There is no prior echocardiogram for comparison.  Ejection fraction is visually estimated at 65-70%.  Mild concentric left ventricular hypertrophy.  Normal left ventricular size and systolic function with no appreciable  segmental  abnormality.  Indeterminate diastolic function.  )       Anesthesia Quick Evaluation

## 2021-03-26 NOTE — Progress Notes (Signed)
Anesthesia Chart Review:  Patient was previously followed by cardiology at [facility] for history of DOE and mitral valve disorder (reported history of mitral stenosis, however echo 08/2019 showed no evidence of this).  She was last seen by Dr. Desma Maxim 10/15/2020 and at that time was advised to follow-up with cardiology as needed.  In his note, however, he did address upcoming hip replacement.  Per note, "On follow up to day, Tracey Morgan reports that she is not had any issues. Her blood pressure is 124/90 heart rate is 98 bpm oxygen saturations 99% height is 5 foot 2 inches weight 194.8 pounds. Despite the patient's history of mitral stenosis the echocardiogram from 9/20 showed only mitral annular calcification no mitral regurgitation no MS. He had a negative nuclear stress test 1 year ago. She does continue long-term aspirin therapy. Patient tells me that she is going to plan to have her hip done in December. 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 She is not on any medications that we have prescribed. I have discussed with her that at this point we would follow her on a as needed basis. She does not need any further testing prior to her hip surgery."  Preop labs reviewed, unremarkable.  EKG 03/21/2021: NSR.  Rate 76.  Nuclear stress 10/03/2019 (Care Everywhere): Summary  1. Good quality study.  2. Normal Lexiscan stress EKG.  3. Normal resting myocardial perfusion with no evidence of old infarct.  4. Normal stress myocardial perfusion with no evidence of ischemia.  5. Normal LV systolic function, EF 80%   TTE 08/29/2019 (Care Everywhere): Summary  There is no prior echocardiogram for comparison.  Ejection fraction is visually estimated at 65-70%.  Mild concentric left ventricular hypertrophy.  Normal left ventricular size and systolic function with no appreciable  segmental abnormality.  Indeterminate diastolic  function.    Tracey Morgan [facility] Short Stay Center/Anesthesiology Phone [phone] 03/26/2021 11:32 AM

## 2021-03-26 NOTE — Progress Notes (Signed)
Pt aware to arrive [facility] admitting at 0840 on Tuesday 03/27/2021 for schedule surgical procedure. No food after midnight; clear liquids from midnight till 0800 then nothing by mouth.

## 2021-03-27 ENCOUNTER — Other Ambulatory Visit: Payer: Self-pay

## 2021-03-27 ENCOUNTER — Encounter (HOSPITAL_COMMUNITY)
Admission: RE | Disposition: A | Discharge status: Skilled Nursing Facility | Payer: Self-pay | Source: Home / Self Care | Attending: Orthopedic Surgery

## 2021-03-27 ENCOUNTER — Ambulatory Visit (HOSPITAL_COMMUNITY): Payer: PPO | Admitting: Physician Assistant

## 2021-03-27 ENCOUNTER — Encounter (HOSPITAL_COMMUNITY): Payer: Self-pay | Admitting: Orthopedic Surgery

## 2021-03-27 ENCOUNTER — Observation Stay (HOSPITAL_COMMUNITY): Payer: PPO

## 2021-03-27 ENCOUNTER — Ambulatory Visit (HOSPITAL_COMMUNITY): Payer: PPO

## 2021-03-27 ENCOUNTER — Ambulatory Visit (HOSPITAL_COMMUNITY): Payer: PPO | Admitting: Certified Registered"

## 2021-03-27 ENCOUNTER — Inpatient Hospital Stay (HOSPITAL_COMMUNITY)
Admission: RE | Admit: 2021-03-27 | Discharge: 2021-04-04 | DRG: 470 | Disposition: A | Discharge status: Skilled Nursing Facility | Payer: PPO | Attending: Orthopedic Surgery | Admitting: Orthopedic Surgery

## 2021-03-27 DIAGNOSIS — Z888 Allergy status to other drugs, medicaments and biological substances status: Secondary | ICD-10-CM | POA: Diagnosis not present

## 2021-03-27 DIAGNOSIS — L309 Dermatitis, unspecified: Secondary | ICD-10-CM | POA: Diagnosis not present

## 2021-03-27 DIAGNOSIS — K219 Gastro-esophageal reflux disease without esophagitis: Secondary | ICD-10-CM | POA: Diagnosis present

## 2021-03-27 DIAGNOSIS — Z20822 Contact with and (suspected) exposure to covid-19: Secondary | ICD-10-CM | POA: Diagnosis present

## 2021-03-27 DIAGNOSIS — Z7401 Bed confinement status: Secondary | ICD-10-CM | POA: Diagnosis not present

## 2021-03-27 DIAGNOSIS — Z91048 Other nonmedicinal substance allergy status: Secondary | ICD-10-CM | POA: Diagnosis not present

## 2021-03-27 DIAGNOSIS — R7303 Prediabetes: Secondary | ICD-10-CM | POA: Diagnosis present

## 2021-03-27 DIAGNOSIS — Z79899 Other long term (current) drug therapy: Secondary | ICD-10-CM

## 2021-03-27 DIAGNOSIS — Z96649 Presence of unspecified artificial hip joint: Secondary | ICD-10-CM

## 2021-03-27 DIAGNOSIS — M2550 Pain in unspecified joint: Secondary | ICD-10-CM | POA: Diagnosis not present

## 2021-03-27 DIAGNOSIS — M25751 Osteophyte, right hip: Secondary | ICD-10-CM | POA: Diagnosis present

## 2021-03-27 DIAGNOSIS — Z87442 Personal history of urinary calculi: Secondary | ICD-10-CM

## 2021-03-27 DIAGNOSIS — Z885 Allergy status to narcotic agent status: Secondary | ICD-10-CM | POA: Diagnosis not present

## 2021-03-27 DIAGNOSIS — Z9181 History of falling: Secondary | ICD-10-CM | POA: Diagnosis not present

## 2021-03-27 DIAGNOSIS — M1611 Unilateral primary osteoarthritis, right hip: Secondary | ICD-10-CM | POA: Diagnosis present

## 2021-03-27 DIAGNOSIS — Z743 Need for continuous supervision: Secondary | ICD-10-CM | POA: Diagnosis not present

## 2021-03-27 DIAGNOSIS — Z96641 Presence of right artificial hip joint: Secondary | ICD-10-CM | POA: Diagnosis not present

## 2021-03-27 DIAGNOSIS — M169 Osteoarthritis of hip, unspecified: Secondary | ICD-10-CM | POA: Diagnosis present

## 2021-03-27 DIAGNOSIS — M545 Low back pain, unspecified: Secondary | ICD-10-CM | POA: Diagnosis not present

## 2021-03-27 DIAGNOSIS — Z419 Encounter for procedure for purposes other than remedying health state, unspecified: Secondary | ICD-10-CM

## 2021-03-27 DIAGNOSIS — Z471 Aftercare following joint replacement surgery: Secondary | ICD-10-CM | POA: Diagnosis not present

## 2021-03-27 DIAGNOSIS — M549 Dorsalgia, unspecified: Secondary | ICD-10-CM | POA: Diagnosis not present

## 2021-03-27 DIAGNOSIS — I469 Cardiac arrest, cause unspecified: Secondary | ICD-10-CM | POA: Diagnosis not present

## 2021-03-27 HISTORY — PX: TOTAL HIP ARTHROPLASTY: SHX124

## 2021-03-27 LAB — ABO/RH: ABO/RH(D): A POS

## 2021-03-27 LAB — TYPE AND SCREEN
ABO/RH(D): A POS
Antibody Screen: NEGATIVE

## 2021-03-27 SURGERY — ARTHROPLASTY, HIP, TOTAL, ANTERIOR APPROACH
Anesthesia: Spinal | Site: Hip | Laterality: Right

## 2021-03-27 MED ORDER — BUPIVACAINE HCL 0.25 % IJ SOLN
INTRAMUSCULAR | Status: AC
  Filled 2021-03-27: qty 1

## 2021-03-27 MED ORDER — CEFAZOLIN SODIUM-DEXTROSE 2-4 GM/100ML-% IV SOLN
2.0000 g | Freq: Four times a day (QID) | INTRAVENOUS | Status: AC
  Administered 2021-03-27 (×2): 2 g via INTRAVENOUS
  Filled 2021-03-27 (×2): qty 100

## 2021-03-27 MED ORDER — PROPOFOL 10 MG/ML IV BOLUS
INTRAVENOUS | Status: AC
  Filled 2021-03-27: qty 20

## 2021-03-27 MED ORDER — LABETALOL HCL 5 MG/ML IV SOLN
INTRAVENOUS | Status: AC
  Filled 2021-03-27: qty 4

## 2021-03-27 MED ORDER — LACTATED RINGERS IV SOLN: INTRAVENOUS | Status: DC

## 2021-03-27 MED ORDER — DIPHENHYDRAMINE HCL 25 MG PO TABS
25.0000 mg | ORAL_TABLET | Freq: Every evening | ORAL | Status: DC | PRN
  Administered 2021-03-30: 25 mg via ORAL
  Filled 2021-03-27 (×3): qty 1

## 2021-03-27 MED ORDER — ACETAMINOPHEN 500 MG PO TABS
500.0000 mg | ORAL_TABLET | Freq: Four times a day (QID) | ORAL | Status: AC
  Administered 2021-03-27 – 2021-03-28 (×4): 500 mg via ORAL
  Filled 2021-03-27 (×4): qty 1

## 2021-03-27 MED ORDER — HYDROMORPHONE HCL 2 MG PO TABS
2.0000 mg | ORAL_TABLET | ORAL | Status: DC | PRN
  Administered 2021-03-27 – 2021-04-01 (×21): 4 mg via ORAL
  Administered 2021-04-02 (×2): 2 mg via ORAL
  Administered 2021-04-02 (×2): 4 mg via ORAL
  Administered 2021-04-03 – 2021-04-04 (×8): 2 mg via ORAL
  Filled 2021-03-27: qty 2
  Filled 2021-03-27: qty 1
  Filled 2021-03-27 (×8): qty 2
  Filled 2021-03-27: qty 1
  Filled 2021-03-27 (×7): qty 2
  Filled 2021-03-27 (×2): qty 1
  Filled 2021-03-27 (×3): qty 2
  Filled 2021-03-27: qty 1
  Filled 2021-03-27: qty 2
  Filled 2021-03-27: qty 1
  Filled 2021-03-27 (×2): qty 2
  Filled 2021-03-27: qty 1
  Filled 2021-03-27 (×3): qty 2
  Filled 2021-03-27 (×2): qty 1

## 2021-03-27 MED ORDER — PROMETHAZINE HCL 25 MG/ML IJ SOLN
6.2500 mg | Freq: Once | INTRAMUSCULAR | Status: AC | PRN
  Administered 2021-03-27: 6.25 mg via INTRAVENOUS

## 2021-03-27 MED ORDER — FENTANYL CITRATE (PF) 100 MCG/2ML IJ SOLN
INTRAMUSCULAR | Status: AC
  Filled 2021-03-27: qty 2

## 2021-03-27 MED ORDER — TRANEXAMIC ACID-NACL 1000-0.7 MG/100ML-% IV SOLN
1000.0000 mg | INTRAVENOUS | Status: AC
  Administered 2021-03-27: 1000 mg via INTRAVENOUS
  Filled 2021-03-27: qty 100

## 2021-03-27 MED ORDER — MIDAZOLAM HCL 2 MG/2ML IJ SOLN
INTRAMUSCULAR | Status: AC
  Filled 2021-03-27: qty 2

## 2021-03-27 MED ORDER — HYDROMORPHONE HCL 1 MG/ML IJ SOLN
0.5000 mg | Freq: Once | INTRAMUSCULAR | Status: AC | PRN
  Administered 2021-03-27: 0.5 mg via INTRAVENOUS

## 2021-03-27 MED ORDER — LABETALOL HCL 5 MG/ML IV SOLN
INTRAVENOUS | Status: DC | PRN
  Administered 2021-03-27: 5 mg via INTRAVENOUS

## 2021-03-27 MED ORDER — METOCLOPRAMIDE HCL 5 MG PO TABS: 5.0000 mg | ORAL_TABLET | Freq: Three times a day (TID) | ORAL | Status: DC | PRN

## 2021-03-27 MED ORDER — ONDANSETRON HCL 4 MG/2ML IJ SOLN
4.0000 mg | Freq: Four times a day (QID) | INTRAMUSCULAR | Status: DC | PRN
  Administered 2021-03-27 – 2021-03-28 (×3): 4 mg via INTRAVENOUS
  Filled 2021-03-27 (×3): qty 2

## 2021-03-27 MED ORDER — FENTANYL CITRATE (PF) 250 MCG/5ML IJ SOLN
INTRAMUSCULAR | Status: DC | PRN
  Administered 2021-03-27 (×2): 50 ug via INTRAVENOUS
  Administered 2021-03-27: 100 ug via INTRAVENOUS

## 2021-03-27 MED ORDER — ASPIRIN EC 325 MG PO TBEC
325.0000 mg | DELAYED_RELEASE_TABLET | Freq: Two times a day (BID) | ORAL | Status: DC
  Administered 2021-03-28 – 2021-04-04 (×15): 325 mg via ORAL
  Filled 2021-03-27 (×15): qty 1

## 2021-03-27 MED ORDER — ALUM & MAG HYDROXIDE-SIMETH 200-200-20 MG/5ML PO SUSP
30.0000 mL | Freq: Four times a day (QID) | ORAL | Status: DC | PRN
  Administered 2021-03-28 – 2021-04-04 (×10): 30 mL via ORAL
  Filled 2021-03-27 (×10): qty 30

## 2021-03-27 MED ORDER — METHOCARBAMOL 500 MG PO TABS
500.0000 mg | ORAL_TABLET | Freq: Four times a day (QID) | ORAL | Status: DC | PRN
  Administered 2021-03-28: 500 mg via ORAL
  Filled 2021-03-27: qty 1

## 2021-03-27 MED ORDER — BUPIVACAINE IN DEXTROSE 0.75-8.25 % IT SOLN
INTRATHECAL | Status: DC | PRN
  Administered 2021-03-27: 1.6 mL via INTRATHECAL

## 2021-03-27 MED ORDER — FENTANYL CITRATE (PF) 100 MCG/2ML IJ SOLN
INTRAMUSCULAR | Status: AC
  Administered 2021-03-27: 25 ug via INTRAVENOUS
  Filled 2021-03-27: qty 2

## 2021-03-27 MED ORDER — AMISULPRIDE (ANTIEMETIC) 5 MG/2ML IV SOLN
INTRAVENOUS | Status: AC
  Administered 2021-03-27: 10 mg via INTRAVENOUS
  Filled 2021-03-27: qty 4

## 2021-03-27 MED ORDER — PHENOL 1.4 % MT LIQD: 1.0000 | OROMUCOSAL | Status: DC | PRN

## 2021-03-27 MED ORDER — PROMETHAZINE HCL 25 MG/ML IJ SOLN
INTRAMUSCULAR | Status: AC
  Filled 2021-03-27: qty 1

## 2021-03-27 MED ORDER — PROPOFOL 10 MG/ML IV BOLUS
INTRAVENOUS | Status: DC | PRN
  Administered 2021-03-27: 80 mg via INTRAVENOUS
  Administered 2021-03-27: 20 mg via INTRAVENOUS
  Administered 2021-03-27 (×2): 30 mg via INTRAVENOUS

## 2021-03-27 MED ORDER — CHLORHEXIDINE GLUCONATE 0.12 % MT SOLN
15.0000 mL | Freq: Once | OROMUCOSAL | Status: AC
  Administered 2021-03-27: 15 mL via OROMUCOSAL

## 2021-03-27 MED ORDER — ROCURONIUM BROMIDE 10 MG/ML (PF) SYRINGE
PREFILLED_SYRINGE | INTRAVENOUS | Status: DC | PRN
  Administered 2021-03-27: 50 mg via INTRAVENOUS

## 2021-03-27 MED ORDER — CEFAZOLIN SODIUM-DEXTROSE 2-4 GM/100ML-% IV SOLN
2.0000 g | INTRAVENOUS | Status: AC
  Administered 2021-03-27: 2 g via INTRAVENOUS
  Filled 2021-03-27: qty 100

## 2021-03-27 MED ORDER — POLYETHYLENE GLYCOL 3350 17 G PO PACK
17.0000 g | PACK | Freq: Every day | ORAL | Status: DC | PRN
  Administered 2021-03-31 – 2021-04-01 (×2): 17 g via ORAL
  Filled 2021-03-27 (×2): qty 1

## 2021-03-27 MED ORDER — FENTANYL CITRATE (PF) 100 MCG/2ML IJ SOLN
INTRAMUSCULAR | Status: AC
  Administered 2021-03-27: 50 ug via INTRAVENOUS
  Filled 2021-03-27: qty 2

## 2021-03-27 MED ORDER — BUPIVACAINE HCL 0.25 % IJ SOLN
INTRAMUSCULAR | Status: DC | PRN
  Administered 2021-03-27: 30 mL

## 2021-03-27 MED ORDER — ONDANSETRON HCL 4 MG PO TABS
4.0000 mg | ORAL_TABLET | Freq: Four times a day (QID) | ORAL | Status: DC | PRN
  Administered 2021-03-28: 4 mg via ORAL
  Filled 2021-03-27: qty 1

## 2021-03-27 MED ORDER — ACETAMINOPHEN 10 MG/ML IV SOLN
1000.0000 mg | Freq: Four times a day (QID) | INTRAVENOUS | Status: DC
  Administered 2021-03-27: 1000 mg via INTRAVENOUS
  Filled 2021-03-27: qty 100

## 2021-03-27 MED ORDER — FENTANYL CITRATE (PF) 100 MCG/2ML IJ SOLN
25.0000 ug | INTRAMUSCULAR | Status: DC | PRN
  Administered 2021-03-27 (×3): 25 ug via INTRAVENOUS

## 2021-03-27 MED ORDER — 0.9 % SODIUM CHLORIDE (POUR BTL) OPTIME
TOPICAL | Status: DC | PRN
  Administered 2021-03-27: 1000 mL

## 2021-03-27 MED ORDER — BISACODYL 10 MG RE SUPP
10.0000 mg | Freq: Every day | RECTAL | Status: DC | PRN
  Administered 2021-04-02 – 2021-04-03 (×2): 10 mg via RECTAL
  Filled 2021-03-27 (×3): qty 1

## 2021-03-27 MED ORDER — LIDOCAINE 2% (20 MG/ML) 5 ML SYRINGE
INTRAMUSCULAR | Status: DC | PRN
  Administered 2021-03-27: 100 mg via INTRAVENOUS

## 2021-03-27 MED ORDER — DOCUSATE SODIUM 100 MG PO CAPS
100.0000 mg | ORAL_CAPSULE | Freq: Two times a day (BID) | ORAL | Status: DC
  Administered 2021-03-27 – 2021-04-04 (×16): 100 mg via ORAL
  Filled 2021-03-27 (×16): qty 1

## 2021-03-27 MED ORDER — METOCLOPRAMIDE HCL 5 MG/ML IJ SOLN
5.0000 mg | Freq: Three times a day (TID) | INTRAMUSCULAR | Status: DC | PRN
Start: 2021-03-27 — End: 2021-04-04
  Administered 2021-03-28: 10 mg via INTRAVENOUS
  Filled 2021-03-27: qty 2

## 2021-03-27 MED ORDER — METHOCARBAMOL 1000 MG/10ML IJ SOLN
500.0000 mg | Freq: Four times a day (QID) | INTRAVENOUS | Status: DC | PRN
  Filled 2021-03-27: qty 5

## 2021-03-27 MED ORDER — DEXAMETHASONE SODIUM PHOSPHATE 10 MG/ML IJ SOLN
8.0000 mg | Freq: Once | INTRAMUSCULAR | Status: AC
  Administered 2021-03-27: 8 mg via INTRAVENOUS

## 2021-03-27 MED ORDER — MAGNESIUM CITRATE PO SOLN: 1.0000 | Freq: Once | ORAL | Status: DC | PRN

## 2021-03-27 MED ORDER — HYDROMORPHONE HCL 1 MG/ML IJ SOLN
0.5000 mg | INTRAMUSCULAR | Status: DC | PRN
  Administered 2021-03-27 – 2021-03-28 (×4): 1 mg via INTRAVENOUS
  Filled 2021-03-27 (×5): qty 1

## 2021-03-27 MED ORDER — DEXAMETHASONE SODIUM PHOSPHATE 10 MG/ML IJ SOLN
INTRAMUSCULAR | Status: AC
  Filled 2021-03-27: qty 1

## 2021-03-27 MED ORDER — WATER FOR IRRIGATION, STERILE IR SOLN
Status: DC | PRN
  Administered 2021-03-27: 2000 mL

## 2021-03-27 MED ORDER — SUGAMMADEX SODIUM 200 MG/2ML IV SOLN
INTRAVENOUS | Status: DC | PRN
  Administered 2021-03-27: 180 mg via INTRAVENOUS

## 2021-03-27 MED ORDER — MENTHOL 3 MG MT LOZG
1.0000 | LOZENGE | OROMUCOSAL | Status: DC | PRN
  Administered 2021-03-28: 3 mg via ORAL
  Filled 2021-03-27: qty 9

## 2021-03-27 MED ORDER — DEXAMETHASONE SODIUM PHOSPHATE 10 MG/ML IJ SOLN
10.0000 mg | Freq: Once | INTRAMUSCULAR | Status: AC
  Administered 2021-03-28: 10 mg via INTRAVENOUS
  Filled 2021-03-27: qty 1

## 2021-03-27 MED ORDER — ORAL CARE MOUTH RINSE: 15.0000 mL | Freq: Once | OROMUCOSAL | Status: AC

## 2021-03-27 MED ORDER — AMISULPRIDE (ANTIEMETIC) 5 MG/2ML IV SOLN: 10.0000 mg | Freq: Once | INTRAVENOUS | Status: AC | PRN

## 2021-03-27 MED ORDER — ONDANSETRON HCL 4 MG/2ML IJ SOLN
INTRAMUSCULAR | Status: DC | PRN
  Administered 2021-03-27: 4 mg via INTRAVENOUS

## 2021-03-27 MED ORDER — SODIUM CHLORIDE 0.9 % IV SOLN: INTRAVENOUS | Status: DC

## 2021-03-27 MED ORDER — PROPOFOL 500 MG/50ML IV EMUL
INTRAVENOUS | Status: DC | PRN
  Administered 2021-03-27: 110 ug/kg/min via INTRAVENOUS

## 2021-03-27 MED ORDER — HYDROMORPHONE HCL 1 MG/ML IJ SOLN
INTRAMUSCULAR | Status: AC
  Filled 2021-03-27: qty 1

## 2021-03-27 MED ORDER — POVIDONE-IODINE 10 % EX SWAB
2.0000 "application " | Freq: Once | CUTANEOUS | Status: AC
  Administered 2021-03-27: 2 via TOPICAL

## 2021-03-27 SURGICAL SUPPLY — 46 items
BAG DECANTER FOR FLEXI CONT (MISCELLANEOUS) IMPLANT
BAG ZIPLOCK 12X15 (MISCELLANEOUS) ×2 IMPLANT
BALL HIP CERAMIC 32MM PLUS 9 ×1 IMPLANT
BLADE SAG 18X100X1.27 (BLADE) ×2 IMPLANT
COVER PERINEAL POST (MISCELLANEOUS) ×2 IMPLANT
COVER SURGICAL LIGHT HANDLE (MISCELLANEOUS) ×2 IMPLANT
COVER WAND RF STERILE (DRAPES) IMPLANT
CUP ACETBLR 52 OD PINNACLE (Hips) ×2 IMPLANT
DECANTER SPIKE VIAL GLASS SM (MISCELLANEOUS) ×2 IMPLANT
DRAPE STERI IOBAN 125X83 (DRAPES) ×2 IMPLANT
DRAPE U-SHAPE 47X51 STRL (DRAPES) ×4 IMPLANT
DRESSING AQUACEL AG SP 3.5X10 (GAUZE/BANDAGES/DRESSINGS) ×1 IMPLANT
DRSG AQUACEL AG ADV 3.5X10 (GAUZE/BANDAGES/DRESSINGS) ×2 IMPLANT
DRSG AQUACEL AG SP 3.5X10 (GAUZE/BANDAGES/DRESSINGS) ×2
DURAPREP 26ML APPLICATOR (WOUND CARE) ×2 IMPLANT
ELECT REM PT RETURN 15FT ADLT (MISCELLANEOUS) ×2 IMPLANT
GLOVE SRG 8 PF TXTR STRL LF DI (GLOVE) ×1 IMPLANT
GLOVE SURG ENC MOIS LTX SZ6.5 (GLOVE) ×2 IMPLANT
GLOVE SURG ENC MOIS LTX SZ7 (GLOVE) ×2 IMPLANT
GLOVE SURG ENC MOIS LTX SZ8 (GLOVE) ×4 IMPLANT
GLOVE SURG UNDER POLY LF SZ7 (GLOVE) ×2 IMPLANT
GLOVE SURG UNDER POLY LF SZ8 (GLOVE) ×1
GLOVE SURG UNDER POLY LF SZ8.5 (GLOVE) IMPLANT
GOWN STRL REUS W/TWL LRG LVL3 (GOWN DISPOSABLE) ×4 IMPLANT
GOWN STRL REUS W/TWL XL LVL3 (GOWN DISPOSABLE) IMPLANT
HIP BALL CERAMIC 32MM PLUS 9 ×2 IMPLANT
HOLDER FOLEY CATH W/STRAP (MISCELLANEOUS) ×2 IMPLANT
KIT TURNOVER KIT A (KITS) ×2 IMPLANT
LINER MARATHON NEUT +4X52X32 (Hips) ×2 IMPLANT
MANIFOLD NEPTUNE II (INSTRUMENTS) ×2 IMPLANT
PACK ANTERIOR HIP CUSTOM (KITS) ×2 IMPLANT
PENCIL SMOKE EVACUATOR COATED (MISCELLANEOUS) ×2 IMPLANT
SCREW 6.5MMX25MM (Screw) ×2 IMPLANT
SCREW PINN CAN 6.5X20 (Screw) ×2 IMPLANT
STEM FEM ACTIS HIGH SZ7 (Stem) ×2 IMPLANT
STRIP CLOSURE SKIN 1/2X4 (GAUZE/BANDAGES/DRESSINGS) ×4 IMPLANT
SUT ETHIBOND NAB CT1 #1 30IN (SUTURE) ×2 IMPLANT
SUT MNCRL AB 4-0 PS2 18 (SUTURE) ×2 IMPLANT
SUT STRATAFIX 0 PDS 27 VIOLET (SUTURE) ×2
SUT VIC AB 2-0 CT1 27 (SUTURE) ×2
SUT VIC AB 2-0 CT1 TAPERPNT 27 (SUTURE) ×2 IMPLANT
SUTURE STRATFX 0 PDS 27 VIOLET (SUTURE) ×1 IMPLANT
SYR 50ML LL SCALE MARK (SYRINGE) IMPLANT
TRAY FOLEY MTR SLVR 14FR STAT (SET/KITS/TRAYS/PACK) ×2 IMPLANT
TRAY FOLEY MTR SLVR 16FR STAT (SET/KITS/TRAYS/PACK) IMPLANT
TUBE SUCTION HIGH CAP CLEAR NV (SUCTIONS) ×2 IMPLANT

## 2021-03-27 NOTE — Anesthesia Procedure Notes (Signed)
Spinal  Patient location during procedure: OR Start time: 03/27/2021 10:48 AM End time: 03/27/2021 10:52 AM Reason for block: surgical anesthesia Staffing Performed: resident/CRNA  Resident/CRNA: Milford Cage, CRNA Preanesthetic Checklist Completed: patient identified, IV checked, site marked, risks and benefits discussed, surgical consent, monitors and equipment checked, pre-op evaluation and timeout performed Spinal Block Patient position: sitting Prep: DuraPrep Patient monitoring: heart rate, cardiac monitor, continuous pulse ox and blood pressure Approach: midline Location: L3-4 Injection technique: single-shot Needle Needle type: Sprotte and Pencan  Needle gauge: 24 G Needle length: 9 cm Assessment Sensory level: T6 Events: CSF return Additional Notes IV functioning, monitors applied to pt. Expiration date of kit checked and confirmed to be in date. Sterile prep and drape, hand hygiene and sterile gloved used. Pt was positioned and spine was prepped in sterile fashion. Skin was anesthetized with lidocaine. Free flow of clear CSF obtained prior to injecting local anesthetic into CSF x 1 attempt. Spinal needle aspirated freely following injection. Needle was carefully withdrawn, and pt tolerated procedure well. Loss of motor and sensory on exam post injection.

## 2021-03-27 NOTE — Discharge Instructions (Signed)
Frank Aluisio, MD Total Joint Specialist [facility] [address] [place], [place] [address] [phone]  ANTERIOR APPROACH TOTAL HIP REPLACEMENT POSTOPERATIVE DIRECTIONS     Hip Rehabilitation, Guidelines Following Surgery  The results of a hip operation are greatly improved after range of motion and muscle strengthening exercises. Follow all safety measures which are given to protect your hip. If any of these exercises cause increased pain or swelling in your joint, decrease the amount until you are comfortable again. Then slowly increase the exercises. Call your caregiver if you have problems or questions.   BLOOD CLOT PREVENTION . Take a 325 mg Aspirin two times a day for three weeks following surgery. Then take an 81 mg Aspirin once a day for three weeks. Then discontinue Aspirin. . You may resume your vitamins/supplements upon discharge from the hospital. . Do not take any NSAIDs (Advil, Aleve, Ibuprofen, Meloxicam, etc.) until you have discontinued the 325 mg Aspirin.  HOME CARE INSTRUCTIONS  . Remove items at home which could result in a fall. This includes throw rugs or furniture in walking pathways.   ICE to the affected hip as frequently as 20-30 minutes an hour and then as needed for pain and swelling. Continue to use ice on the hip for pain and swelling from surgery. You may notice swelling that will progress down to the foot and ankle. This is normal after surgery. Elevate the leg when you are not up walking on it.    Continue to use the breathing machine which will help keep your temperature down.  It is common for your temperature to cycle up and down following surgery, especially at night when you are not up moving around and exerting yourself.  The breathing machine keeps your lungs expanded and your temperature down.  DIET You may resume your previous home diet once your are discharged from the hospital.  DRESSING / WOUND CARE /  SHOWERING . You have an adhesive waterproof bandage over the incision. Leave this in place until your first follow-up appointment. Once you remove this you will not need to place another bandage.  . You may begin showering 3 days following surgery, but do not submerge the incision under water.  ACTIVITY . For the first 3-5 days, it is important to rest and keep the operative leg elevated. You should, as a general rule, rest for 50 minutes and walk/stretch for 10 minutes per hour. After 5 days, you may slowly increase activity as tolerated.  . Perform the exercises you were provided twice a day for about 15-20 minutes each session. Begin these 2 days following surgery. . Walk with your walker as instructed. Use the walker until you are comfortable transitioning to a cane. Walk with the cane in the opposite hand of the operative leg. You may discontinue the cane once you are comfortable and walking steadily. . Avoid periods of inactivity such as sitting longer than an hour when not asleep. This helps prevent blood clots.  . Do not drive a car for 6 weeks or until released by your surgeon.  . Do not drive while taking narcotics.  TED HOSE STOCKINGS Wear the elastic stockings on both legs for three weeks following surgery during the day. You may remove them at night while sleeping.  WEIGHT BEARING Weight bearing as tolerated with assist device (walker, cane, etc) as directed, use it as long as suggested by your surgeon or therapist, typically at least 4-6 weeks.  POSTOPERATIVE CONSTIPATION PROTOCOL Constipation -   defined medically as fewer than three stools per week and severe constipation as less than one stool per week.  One of the most common issues patients have following surgery is constipation.  Even if you have a regular bowel pattern at home, your normal regimen is likely to be disrupted due to multiple reasons following surgery.  Combination of anesthesia, postoperative narcotics, change in  appetite and fluid intake all can affect your bowels.  In order to avoid complications following surgery, here are some recommendations in order to help you during your recovery period.  . Colace (docusate) - Pick up an over-the-counter form of Colace or another stool softener and take twice a day as long as you are requiring postoperative pain medications.  Take with a full glass of water daily.  If you experience loose stools or diarrhea, hold the colace until you stool forms back up.  If your symptoms do not get better within 1 week or if they get worse, check with your doctor. . Dulcolax (bisacodyl) - Pick up over-the-counter and take as directed by the product packaging as needed to assist with the movement of your bowels.  Take with a full glass of water.  Use this product as needed if not relieved by Colace only.  . MiraLax (polyethylene glycol) - Pick up over-the-counter to have on hand.  MiraLax is a solution that will increase the amount of water in your bowels to assist with bowel movements.  Take as directed and can mix with a glass of water, juice, soda, coffee, or tea.  Take if you go more than two days without a movement.Do not use MiraLax more than once per day. Call your doctor if you are still constipated or irregular after using this medication for 7 days in a row.  If you continue to have problems with postoperative constipation, please contact the office for further assistance and recommendations.  If you experience "the worst abdominal pain ever" or develop nausea or vomiting, please contact the office immediatly for further recommendations for treatment.  ITCHING  If you experience itching with your medications, try taking only a single pain pill, or even half a pain pill at a time.  You can also use Benadryl over the counter for itching or also to help with sleep.   MEDICATIONS See your medication summary on the "After Visit Summary" that the nursing staff will review with you  prior to discharge.  You may have some home medications which will be placed on hold until you complete the course of blood thinner medication.  It is important for you to complete the blood thinner medication as prescribed by your surgeon.  Continue your approved medications as instructed at time of discharge.  PRECAUTIONS If you experience chest pain or shortness of breath - call 911 immediately for transfer to the hospital emergency department.  If you develop a fever greater that 101 F, purulent drainage from wound, increased redness or drainage from wound, foul odor from the wound/dressing, or calf pain - CONTACT YOUR SURGEON.                                                   FOLLOW-UP APPOINTMENTS Make sure you keep all of your appointments after your operation with your surgeon and caregivers. You should call the office at the above phone number and   make an appointment for approximately two weeks after the date of your surgery or on the date instructed by your surgeon outlined in the "After Visit Summary".  RANGE OF MOTION AND STRENGTHENING EXERCISES  These exercises are designed to help you keep full movement of your hip joint. Follow your caregiver's or physical therapist's instructions. Perform all exercises about fifteen times, three times per day or as directed. Exercise both hips, even if you have had only one joint replacement. These exercises can be done on a training (exercise) mat, on the floor, on a table or on a bed. Use whatever works the best and is most comfortable for you. Use music or television while you are exercising so that the exercises are a pleasant break in your day. This will make your life better with the exercises acting as a break in routine you can look forward to.  . Lying on your back, slowly slide your foot toward your buttocks, raising your knee up off the floor. Then slowly slide your foot back down until your leg is straight again.  . Lying on your back spread  your legs as far apart as you can without causing discomfort.  . Lying on your side, raise your upper leg and foot straight up from the floor as far as is comfortable. Slowly lower the leg and repeat.  . Lying on your back, tighten up the muscle in the front of your thigh (quadriceps muscles). You can do this by keeping your leg straight and trying to raise your heel off the floor. This helps strengthen the largest muscle supporting your knee.  . Lying on your back, tighten up the muscles of your buttocks both with the legs straight and with the knee bent at a comfortable angle while keeping your heel on the floor.   POST-OPERATIVE OPIOID TAPER INSTRUCTIONS: . It is important to wean off of your opioid medication as soon as possible. If you do not need pain medication after your surgery it is ok to stop day one. . Opioids include: o Codeine, Hydrocodone(Norco, Vicodin), Oxycodone(Percocet, oxycontin) and hydromorphone amongst others.  . Long term and even short term use of opiods can cause: o Increased pain response o Dependence o Constipation o Depression o Respiratory depression o And more.  . Withdrawal symptoms can include o Flu like symptoms o Nausea, vomiting o And more . Techniques to manage these symptoms o Hydrate well o Eat regular healthy meals o Stay active o Use relaxation techniques(deep breathing, meditating, yoga) . Do Not substitute Alcohol to help with tapering . If you have been on opioids for less than two weeks and do not have pain than it is ok to stop all together.  . Plan to wean off of opioids o This plan should start within one week post op of your joint replacement. o Maintain the same interval or time between taking each dose and first decrease the dose.  o Cut the total daily intake of opioids by one tablet each day o Next start to increase the time between doses. o The last dose that should be eliminated is the evening dose.     IF YOU ARE TRANSFERRED  TO A SKILLED REHAB FACILITY If the patient is transferred to a skilled rehab facility following release from the hospital, a list of the current medications will be sent to the facility for the patient to continue.  When discharged from the skilled rehab facility, please have the facility set up the patient's Home   Health Physical Therapy prior to being released. Also, the skilled facility will be responsible for providing the patient with their medications at time of release from the facility to include their pain medication, the muscle relaxants, and their blood thinner medication. If the patient is still at the rehab facility at time of the two week follow up appointment, the skilled rehab facility will also need to assist the patient in arranging follow up appointment in our office and any transportation needs.  MAKE SURE YOU:  . Understand these instructions.  . Get help right away if you are not doing well or get worse.    DENTAL ANTIBIOTICS:  In most cases prophylactic antibiotics for Dental procdeures after total joint surgery are not necessary.  Exceptions are as follows:  1. History of prior total joint infection  2. Severely immunocompromised (Organ Transplant, cancer chemotherapy, Rheumatoid biologic meds such as Humera)  3. Poorly controlled diabetes (A1C &gt; 8.0, blood glucose over 200)  If you have one of these conditions, contact your surgeon for an antibiotic prescription, prior to your dental procedure.    Pick up stool softner and laxative for home use following surgery while on pain medications. Do not submerge incision under water. Please use good hand washing techniques while changing dressing each day. May shower starting three days after surgery. Please use a clean towel to pat the incision dry following showers. Continue to use ice for pain and swelling after surgery. Do not use any lotions or creams on the incision until instructed by your surgeon.  

## 2021-03-27 NOTE — Evaluation (Signed)
Physical Therapy Evaluation Patient Details Name: Tracey Morgan MRN: [id number] DOB: 27-Jul-1946 Today's Date: 03/27/2021   History of Present Illness  Patient is 75 y.o. female s/p Rt THA anterior approach on 03/27/21 with PMH significant for neuromuscular disorder, GERD, OA, bakc surgery.    Clinical Impression  Tracey Morgan is a 75 y.o. female POD 0 s/p Rt THA. Patient reports independence with mobility at baseline. Patient is now limited by functional impairments (see PT problem list below) and requires min-mod assist for bed mobility and transfers with RW. Patient was limited by pain and unable to advance gait training today. Min assist required to return to bed and reposition. Patient instructed in exercise to facilitate ROM and circulation to manage edema. Patient will benefit from continued skilled PT interventions to address impairments and progress towards PLOF. Acute PT will follow to progress mobility and stair training in preparation for safe discharge home.     Follow Up Recommendations Follow surgeon's recommendation for DC plan and follow-up therapies    Equipment Recommendations  None recommended by PT    Recommendations for Other Services       Precautions / Restrictions Precautions Precautions: Fall Restrictions Weight Bearing Restrictions: No Other Position/Activity Restrictions: WBAT      Mobility  Bed Mobility Overal bed mobility: Needs Assistance Bed Mobility: Supine to Sit;Sit to Supine     Supine to sit: HOB elevated;Mod assist Sit to supine: Min assist   General bed mobility comments: pt required cues for use of bed rail/sequencing to sit up EOB. Mod assist to bring bil LE's off EOB and raise trunk. Pt require Min assist to raise bil LE's back into bed and cues to use Lt LE and bed rails to scoot superiorly to reposition.    Transfers Overall transfer level: Needs assistance Equipment used: Rolling walker (2 wheeled) Transfers: Sit to/from  Stand Sit to Stand: Min assist;From elevated surface         General transfer comment: Min assist for power up from EOB with cues for hand placement for power up.  Ambulation/Gait         Gait velocity: decr   General Gait Details: pt attempted to take several small steps towards HOB. min assist needed to step 1x to Rt, pt limited by pain and return to sit EOB.  Stairs            Wheelchair Mobility    Modified Rankin (Stroke Patients Only)       Balance Overall balance assessment: Needs assistance Sitting-balance support: Feet supported Sitting balance-Leahy Scale: Fair     Standing balance support: Bilateral upper extremity supported;During functional activity Standing balance-Leahy Scale: Poor                               Pertinent Vitals/Pain Pain Assessment: Faces Faces Pain Scale: Hurts even more Pain Location: Rt hip Pain Descriptors / Indicators: Aching;Discomfort Pain Intervention(s): Limited activity within patient's tolerance;Monitored during session;Premedicated before session;Repositioned    Home Living Family/patient expects to be discharged to:: Private residence Living Arrangements: Alone Available Help at Discharge: Family Type of Home: House Home Access: Stairs to enter Entrance Stairs-Rails: None Entrance Stairs-Number of Steps: 2-3 Home Layout: One level Home Equipment: Environmental consultant - 2 wheels Additional Comments: daughter, son, and sister will help out    Prior Function Level of Independence: Independent  Hand Dominance   Dominant Hand: Right    Extremity/Trunk Assessment   Upper Extremity Assessment Upper Extremity Assessment: Overall WFL for tasks assessed    Lower Extremity Assessment Lower Extremity Assessment: RLE deficits/detail RLE: Unable to fully assess due to pain RLE Sensation: WNL RLE Coordination: WNL    Cervical / Trunk Assessment Cervical / Trunk Assessment: Normal   Communication   Communication: No difficulties  Cognition Arousal/Alertness: Awake/alert Behavior During Therapy: WFL for tasks assessed/performed Overall Cognitive Status: Within Functional Limits for tasks assessed                                        General Comments      Exercises     Assessment/Plan    PT Assessment Patient needs continued PT services  PT Problem List Decreased strength;Decreased range of motion;Decreased activity tolerance;Decreased balance;Decreased mobility;Decreased knowledge of use of DME;Decreased knowledge of precautions;Pain       PT Treatment Interventions DME instruction;Gait training;Stair training;Functional mobility training;Therapeutic activities;Therapeutic exercise;Balance training;Patient/family education    PT Goals (Current goals can be found in the Care Plan section)  Acute Rehab PT Goals Patient Stated Goal: stop hurting PT Goal Formulation: With patient Time For Goal Achievement: 04/03/21 Potential to Achieve Goals: Good    Frequency 7X/week   Barriers to discharge        Co-evaluation               AM-PAC PT "6 Clicks" Mobility  Outcome Measure Help needed turning from your back to your side while in a flat bed without using bedrails?: A Little Help needed moving from lying on your back to sitting on the side of a flat bed without using bedrails?: A Lot Help needed moving to and from a bed to a chair (including a wheelchair)?: A Lot Help needed standing up from a chair using your arms (e.g., wheelchair or bedside chair)?: A Lot Help needed to walk in hospital room?: A Lot Help needed climbing 3-5 steps with a railing? : A Lot 6 Click Score: 13    End of Session Equipment Utilized During Treatment: Gait belt Activity Tolerance: Patient limited by pain Patient left: in bed;with call bell/phone within reach;with nursing/sitter in room Nurse Communication: Mobility status PT Visit Diagnosis: Muscle  weakness (generalized) (M62.81);Difficulty in walking, not elsewhere classified (R26.2)    Time: (1712)-1720; 8882-8003  PT Time Calculation (min) (ACUTE ONLY): 28 min   Charges:   PT Evaluation $PT Eval Low Complexity: 1 Low PT Treatments $Therapeutic Activity: 8-22 mins        Wynn Maudlin, DPT Acute Rehabilitation Services Office [phone] Pager [phone]    Anitra Lauth 03/27/2021, 6:41 PM

## 2021-03-27 NOTE — Op Note (Addendum)
OPERATIVE REPORT- TOTAL HIP ARTHROPLASTY   PREOPERATIVE DIAGNOSIS: Osteoarthritis of the Right hip.   POSTOPERATIVE DIAGNOSIS: Osteoarthritis of the Right  hip.   PROCEDURE: Right total hip arthroplasty, anterior approach.   SURGEON: Ollen Gross, MD   ASSISTANT: Nelia Shi, PA-C  ANESTHESIA:  General  ESTIMATED BLOOD LOSS:-650 mL    DRAINS: Hemovac x1.   COMPLICATIONS: None   CONDITION: PACU - hemodynamically stable.   BRIEF CLINICAL NOTE: Tracey Morgan is a 75 y.o. female who has advanced end-  stage arthritis of their Right  hip with progressively worsening pain and  dysfunction.The patient has failed nonoperative management and presents for  total hip arthroplasty.   PROCEDURE IN DETAIL: After successful administration of spinal  anesthetic, the traction boots for the [facility] bed were placed on both  feet and the patient was placed onto the [facility] bed, boots placed into the leg  holders. The Right hip was then isolated from the perineum with plastic  drapes and prepped and draped in the usual sterile fashion. ASIS and  greater trochanter were marked and a oblique incision was made, starting  at about 1 cm lateral and 2 cm distal to the ASIS and coursing towards  the anterior cortex of the femur. The skin was cut with a 10 blade  through subcutaneous tissue to the level of the fascia overlying the  tensor fascia lata muscle. The fascia was then incised in line with the  incision at the junction of the anterior third and posterior 2/3rd. The  muscle was teased off the fascia and then the interval between the TFL  and the rectus was developed. The Hohmann retractor was then placed at  the top of the femoral neck over the capsule. The vessels overlying the  capsule were cauterized and the fat on top of the capsule was removed.  A Hohmann retractor was then placed anterior underneath the rectus  femoris to give exposure to the entire anterior capsule. A T-shaped   capsulotomy was performed. The edges were tagged and the femoral head  was identified.       Osteophytes are removed off the superior acetabulum.  The femoral neck was then cut in situ with an oscillating saw. Traction  was then applied to the left lower extremity utilizing the [facility]  traction. The femoral head was then removed. Retractors were placed  around the acetabulum and then circumferential removal of the labrum was  performed. Osteophytes were also removed. Reaming starts at 47 mm to  medialize and  Increased in 2 mm increments to 51 mm. We reamed in  approximately 40 degrees of abduction, 20 degrees anteversion. A 52 mm  pinnacle acetabular shell was then impacted in anatomic position under  fluoroscopic guidance with excellent purchase. We placed 2 additional dome screws. A 32 mm neutral + 4 marathon liner was then placed into the acetabular shell.       The femoral lift was then placed along the lateral aspect of the femur  just distal to the vastus ridge. The leg was  externally rotated and capsule  was stripped off the inferior aspect of the femoral neck down to the  level of the lesser trochanter, this was done with electrocautery. The femur was lifted after this was performed. The  leg was then placed in an extended and adducted position essentially delivering the femur. We also removed the capsule superiorly and the piriformis from the piriformis fossa to gain excellent exposure of  the  proximal femur. Rongeur was used to remove some cancellous bone to get  into the lateral portion of the proximal femur for placement of the  initial starter reamer. The starter broaches was placed  the starter broach  and was shown to go down the center of the canal. Broaching  with the Actis system was then performed starting at size 0  coursing  Up to size 7. A size 7 had excellent torsional and rotational  and axial stability. The trial high offset neck was then placed  with a 32 + 9 trial  head. The hip was then reduced. We confirmed that  the stem was in the canal both on AP and lateral x-rays. It also has excellent sizing. The hip was reduced with outstanding stability through full extension and full external rotation.. AP pelvis was taken and the leg lengths were measured and found to be equal. Hip was then dislocated again and the femoral head and neck removed. The  femoral broach was removed. Size 7 Actis stem with a high offset  neck was then impacted into the femur following native anteversion. Has  excellent purchase in the canal. Excellent torsional and rotational and  axial stability. It is confirmed to be in the canal on AP and lateral  fluoroscopic views. The 32 + 9 ceramic head was placed and the hip  reduced with outstanding stability. Again AP pelvis was taken and it  confirmed that the leg lengths were equal. The wound was then copiously  irrigated with saline solution and the capsule reattached and repaired  with Ethibond suture. 30 ml of .25% Bupivicaine was  injected into the capsule and into the edge of the tensor fascia lata as well as subcutaneous tissue. The fascia overlying the tensor fascia lata was then closed with a running #1 V-Loc. Subcu was closed with interrupted 2-0 Vicryl and subcuticular running 4-0 Monocryl. Incision was cleaned  and dried. Steri-Strips and a bulky sterile dressing applied. Hemovac  drain was hooked to suction and then the patient was awakened and transported to  recovery in stable condition.        Please note that a surgical assistant was a medical necessity for this procedure to perform it in a safe and expeditious manner. Assistant was necessary to provide appropriate retraction of vital neurovascular structures and to prevent femoral fracture and allow for anatomic placement of the prosthesis.  Ollen Gross, M.D.

## 2021-03-27 NOTE — H&P (Signed)
TOTAL HIP ADMISSION H&P  Patient is admitted for right total hip arthroplasty.  Subjective:  Chief Complaint: Right hip pain  HPI: Tracey Morgan, 75 y.o. female, has a history of pain and functional disability in the right hip due to arthritis and patient has failed non-surgical conservative treatments for greater than 12 weeks to include NSAID's and/or analgesics and activity modification. Onset of symptoms was gradual, starting several years ago with rapidlly worsening course since that time. The patient noted no past surgery on the right hip. Patient currently rates pain in the right hip at 5 out of 10 with activity. Patient has worsening of pain with activity and weight bearing and pain that interfers with activities of daily living. Patient has evidence of periarticular osteophytes and joint space narrowing by imaging studies. This condition presents safety issues increasing the risk of falls. There is no current active infection.  Patient Active Problem List   Diagnosis Date Noted  . 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    Past Medical History:  Diagnosis Date  . Arthritis    back, hips  . Complication of anesthesia   . Diverticulitis    under control  . Eczema   . Family history of adverse reaction to anesthesia    Son had BP dropped in surgery  . GERD (gastroesophageal reflux disease)   . Heart murmur   . History of kidney stones   . Neuromuscular disorder (HCC)    Rt leg   . PONV (postoperative nausea and vomiting)   . Pre-diabetes     Past Surgical History:  Procedure Laterality Date  . ADENOIDECTOMY    . APPENDECTOMY  1979  . BACK SURGERY  1981  . CESAREAN SECTION  1981  . CHOLECYSTECTOMY  1972  . COLON SURGERY  2021   sigmoidectomy  . GALLBLADDER SURGERY  1979  . TONSILLECTOMY      Prior to Admission medications   Medication Sig Start Date End Date Taking? Authorizing Provider  acetaminophen (TYLENOL) 500 MG tablet Take 500-1,000 mg by  mouth every 8 (eight) hours as needed for moderate pain.   Yes [provider]  alum & mag hydroxide-simeth (MAALOX/MYLANTA) 200-200-20 MG/5ML suspension Take 30 mLs by mouth every 6 (six) hours as needed for indigestion or heartburn.   Yes [provider]  aspirin-acetaminophen-caffeine (EXCEDRIN MIGRAINE) [phone] MG tablet Take 1 tablet by mouth daily.   Yes [provider]  cetirizine (ZYRTEC) 10 MG tablet Take 10 mg by mouth daily.   Yes [provider]  Cholecalciferol (VITAMIN D) 50 MCG (2000 UT) tablet Take 2,000 Units by mouth daily.   Yes [provider]  Cinnamon 500 MG capsule Take 500 mg by mouth in the morning, at noon, and at bedtime.   Yes [provider]  clobetasol cream (TEMOVATE) 0.05 % Apply 1 application topically 2 (two) times daily as needed (eczema).   Yes [provider]  diphenhydrAMINE (BENADRYL) 25 MG tablet Take 25 mg by mouth at bedtime.   Yes [provider]  fluticasone (FLONASE) 50 MCG/ACT nasal spray Place 2 sprays into the nose daily.   Yes [provider]  guaiFENesin (MUCINEX) 600 MG 12 hr tablet Take 600 mg by mouth 2 (two) times daily as needed for to loosen phlegm or cough.   Yes [provider]  hydrochlorothiazide (HYDRODIURIL) 12.5 MG tablet Take 12.5 mg by mouth 3 (three) times a week.   Yes [provider]  ketotifen (ALAWAY) 0.025 %  ophthalmic solution Place 1 drop into both eyes 2 (two) times daily as needed (eye allergies).   Yes [provider]  Magnesium 250 MG TABS Take 250 mg by mouth daily.   Yes [provider]  meclizine (ANTIVERT) 25 MG tablet Take 25 mg by mouth 3 (three) times daily as needed for dizziness.   Yes [provider]  Multiple Vitamins-Minerals (PRESERVISION AREDS 2+MULTI VIT PO) Take 1 capsule by mouth in the morning and at bedtime.   Yes [provider]  polyethylene glycol (MIRALAX / GLYCOLAX)  packet Take 17 g by mouth every other day.   Yes [provider]  traMADol (ULTRAM) 50 MG tablet Take 50 mg by mouth every 8 (eight) hours as needed for severe pain. 04/01/19  Yes [provider]  ZINC-VITAMIN C PO Take 1 tablet by mouth daily.   Yes [provider]    Allergies  Allergen Reactions  . Hydralazine Anaphylaxis and Nausea Only    Patient had nausea and headache with po hydralazine 10mg    . Metformin Other (See Comments)    Severe colitis.  . Metronidazole Other (See Comments)    Not specified  . Percocet [Oxycodone-Acetaminophen]     Pt is unsure   . Buprenorphine Hcl Nausea And Vomiting, Rash and Other (See Comments)    hallucinations   . Codeine Nausea And Vomiting, Rash and Other (See Comments)    Hallucinations  . Hydrocodone Nausea And Vomiting and Rash  . Morphine And Related Nausea And Vomiting, Rash and Other (See Comments)    hallucinations    Social History   Socioeconomic History  . Marital status: Divorced    Spouse name: Not on file  . Number of children: Not on file  . Years of education: Not on file  . Highest education level: Not on file  Occupational History  . Not on file  Tobacco Use  . Smoking status: Never Smoker  . Smokeless tobacco: Never Used  Vaping Use  . Vaping Use: Never used  Substance and Sexual Activity  . Alcohol use: No  . Drug use: No  . Sexual activity: Never  Other Topics Concern  . Not on file  Social History Narrative  . Not on file   Social Determinants of Health   Financial Resource Strain: Not on file  Food Insecurity: Not on file  Transportation Needs: Not on file  Physical Activity: Not on file  Stress: Not on file  Social Connections: Not on file  Intimate Partner Violence: Not on file    Tobacco Use: Low Risk   . Smoking Tobacco Use: Never Smoker  . Smokeless Tobacco Use: Never Used   Social History   Substance and Sexual Activity  Alcohol Use No    Family History   Problem Relation Age of Onset  . Angioedema Father   . Eczema Sister   . Immunodeficiency Sister   . Food Allergy Niece     ROS: Constitutional: no fever, no chills, no night sweats, no significant weight loss Cardiovascular: no chest pain, no palpitations Respiratory: no cough, no shortness of breath, No COPD Gastrointestinal: no vomiting, no nausea Musculoskeletal: no swelling in Joints, Joint Pain Neurologic: no numbness, no tingling, no difficulty with balance    Objective:  Physical Exam: Well nourished and well developed.  General: Alert and oriented x3, cooperative and pleasant, no acute distress.  Head: normocephalic, atraumatic, neck supple.  Eyes: EOMI.  Respiratory: breath sounds clear in all fields, no  wheezing, rales, or rhonchi. Cardiovascular: Regular rate and rhythm, no murmurs, gallops or rubs.  Abdomen: non-tender to palpation and soft, normoactive bowel sounds. Musculoskeletal:  The patient has a significantly antalgic gait pattern on the right, utilizing a cane.    Right Hip Exam:  The range of motion: Flexion to 90 degrees, Internal Rotation to 0 degrees, External Rotation to 0 degrees, and abduction to 10 to 20 degrees without discomfort.  There is no tenderness over the greater trochanteric bursa.    Left Hip Exam:  The range of motion: Flexion to 110 degrees, Internal Rotation to 20 degrees, External Rotation to 30 degrees, and abduction to 30 degrees without discomfort.  There is no tenderness over the greater trochanteric bursa.    The patient's sensation and motor function are intact in their lower extremities. Their distal pulses are 2+. The bilateral calves are soft and non-tender.      Vital signs in last 24 hours:    Imaging Review AP pelvis, AP and lateral of the right hip dated 07/05/2020 demonstrate severe bone-on-bone arthritis with erosion of the superolateral aspect of the femoral head, massive osteophyte formation, and  subchondral cystic changes. The left hip shows some degenerative change on the AP pelvis and lateral left hip, but it is not as bad as the right.   Assessment/Plan:  End stage arthritis, right hip  The patient history, physical examination, clinical judgement of the provider and imaging studies are consistent with end stage degenerative joint disease of the right hip and total hip arthroplasty is deemed medically necessary. The treatment options including medical management, injection therapy, arthroscopy and arthroplasty were discussed at length. The risks and benefits of total hip arthroplasty were presented and reviewed. The risks due to aseptic loosening, infection, stiffness, dislocation/subluxation, thromboembolic complications and other imponderables were discussed. The patient acknowledged the explanation, agreed to proceed with the plan and consent was signed. Patient is being admitted for inpatient treatment for surgery, pain control, PT, OT, prophylactic antibiotics, VTE prophylaxis, progressive ambulation and ADLs and discharge planning.The patient is planning to be discharged home.   Patient's anticipated LOS is less than 2 midnights, meeting these requirements:  - Lives within 1 hour of care - Has a competent adult at home to recover with post-op recover - NO history of  - Chronic pain requiring opioids  - Diabetes  - Coronary Artery Disease  - Heart failure  - Heart attack  - Stroke  - DVT/VTE  - Cardiac arrhythmia  - Respiratory Failure/COPD  - Renal failure  - Anemia  - Advanced Liver disease   Therapy Plans: HEP Disposition: Home with daughter or son Planned DVT Prophylaxis: Aspirin 325mg   DME Needed: None PCP: TXA: IV Allergies: Codeine in any form - takes Tramadol, but it causes itching. Morphine can cause hallucinations.  Anesthesia Concerns: N/V - has had good benefit receiving a preoperative patch behind ear.  BMI: 34.8 Last HgbA1c:  5.6  Ph[facility]acy: [facility] [address]  Other: Has had Dilaudid for pain control that worked for her in the past.       - Patient was instructed on what medications to stop prior to surgery. - Follow-up visit in 2 weeks with Dr. [employer] - Begin physical therapy following surgery - Pre-operative lab work as pre-surgical testing - Prescriptions will be provided in hospital at time of discharge  Lequita Halt, [facility], PA-C Orthopedic Surgery [facility] [place]

## 2021-03-27 NOTE — Transfer of Care (Signed)
Immediate Anesthesia Transfer of Care Note  Patient: Tracey Morgan  Procedure(s) Performed: TOTAL HIP ARTHROPLASTY ANTERIOR APPROACH (Right Hip)  Patient Location: PACU  Anesthesia Type:General  Level of Consciousness: awake, alert  and oriented  Airway & Oxygen Therapy: Patient Spontanous Breathing and Patient connected to face mask oxygen  Post-op Assessment: Report given to RN and Post -op Vital signs reviewed and stable  Post vital signs: Reviewed and stable  Last Vitals:  Vitals Value Taken Time  BP 144/101 03/27/21 1303  Temp    Pulse 82 03/27/21 1305  Resp 17 03/27/21 1305  SpO2 100 % 03/27/21 1305  Vitals shown include unvalidated device data.  Last Pain:  Vitals:   03/27/21 0907  TempSrc: Oral  PainSc:       Patients Stated Pain Goal: 3 (03/27/21 0906)  Complications: No complications documented.

## 2021-03-27 NOTE — Interval H&P Note (Signed)
History and Physical Interval Note:  03/27/2021 9:38 AM  Tracey Morgan  has presented today for surgery, with the diagnosis of Right hip osteoarthritis.  The various methods of treatment have been discussed with the patient and family. After consideration of risks, benefits and other options for treatment, the patient has consented to  Procedure(s) with comments: TOTAL HIP ARTHROPLASTY ANTERIOR APPROACH (Right) - as a surgical intervention.  The patient's history has been reviewed, patient examined, no change in status, stable for surgery.  I have reviewed the patient's chart and labs.  Questions were answered to the patient's satisfaction.     Homero Fellers Jhoselin Crume

## 2021-03-27 NOTE — Anesthesia Procedure Notes (Signed)
Procedure Name: Intubation Date/Time: 03/27/2021 11:09 AM Performed by: Niel Hummer, CRNA Pre-anesthesia Checklist: Patient identified, Emergency Drugs available, Suction available and Patient being monitored Patient Re-evaluated:Patient Re-evaluated prior to induction Oxygen Delivery Method: Circle system utilized Preoxygenation: Pre-oxygenation with 100% oxygen Induction Type: IV induction Ventilation: Mask ventilation without difficulty Laryngoscope Size: Mac and 4 Grade View: Grade I Tube type: Oral Tube size: 7.0 mm Number of attempts: 1 Airway Equipment and Method: Stylet Placement Confirmation: positive ETCO2,  ETT inserted through vocal cords under direct vision and breath sounds checked- equal and bilateral Secured at: 22 cm Tube secured with: Tape Dental Injury: Teeth and Oropharynx as per pre-operative assessment

## 2021-03-27 NOTE — Anesthesia Postprocedure Evaluation (Signed)
Anesthesia Post Note  Patient: CRYTAL PENSINGER  Procedure(s) Performed: TOTAL HIP ARTHROPLASTY ANTERIOR APPROACH (Right Hip)     Patient location during evaluation: PACU Anesthesia Type: General Level of consciousness: awake and alert and oriented Pain management: pain level controlled Vital Signs Assessment: post-procedure vital signs reviewed and stable Respiratory status: spontaneous breathing, nonlabored ventilation and respiratory function stable Cardiovascular status: blood pressure returned to baseline Postop Assessment: no apparent nausea or vomiting Anesthetic complications: no   No complications documented.  Last Vitals:  Vitals:   03/27/21 1446 03/27/21 1522  BP: (!) 154/88 (!) 160/80  Pulse: 80 83  Resp: 18 17  Temp: 36.9 C 36.7 C  SpO2: 100% 100%    Last Pain:  Vitals:   03/27/21 1522  TempSrc: Oral  PainSc:                  Kaylyn Layer

## 2021-03-28 ENCOUNTER — Encounter (HOSPITAL_COMMUNITY): Payer: Self-pay | Admitting: Orthopedic Surgery

## 2021-03-28 LAB — CBC
HCT: 30.4 % — ABNORMAL LOW (ref 36.0–46.0)
Hemoglobin: 10.1 g/dL — ABNORMAL LOW (ref 12.0–15.0)
MCH: 32 pg (ref 26.0–34.0)
MCHC: 33.2 g/dL (ref 30.0–36.0)
MCV: 96.2 fL (ref 80.0–100.0)
Platelets: 270 10*3/uL (ref 150–400)
RBC: 3.16 MIL/uL — ABNORMAL LOW (ref 3.87–5.11)
RDW: 12 % (ref 11.5–15.5)
WBC: 10.7 10*3/uL — ABNORMAL HIGH (ref 4.0–10.5)
nRBC: 0 % (ref 0.0–0.2)

## 2021-03-28 LAB — BASIC METABOLIC PANEL
Anion gap: 6 (ref 5–15)
BUN: 10 mg/dL (ref 8–23)
CO2: 26 mmol/L (ref 22–32)
Calcium: 8.8 mg/dL — ABNORMAL LOW (ref 8.9–10.3)
Chloride: 107 mmol/L (ref 98–111)
Creatinine, Ser: 0.69 mg/dL (ref 0.44–1.00)
GFR, Estimated: >60 mL/min (ref >60)
Glucose, Bld: 157 mg/dL — ABNORMAL HIGH (ref 70–99)
Potassium: 3.9 mmol/L (ref 3.5–5.1)
Sodium: 139 mmol/L (ref 135–145)

## 2021-03-28 MED ORDER — HYDROMORPHONE HCL 2 MG PO TABS: 2.0000 mg | ORAL_TABLET | Freq: Four times a day (QID) | ORAL | 0 refills | Status: DC | PRN

## 2021-03-28 MED ORDER — CYCLOBENZAPRINE HCL 10 MG PO TABS
10.0000 mg | ORAL_TABLET | Freq: Three times a day (TID) | ORAL | Status: DC | PRN
  Administered 2021-03-28 – 2021-04-02 (×9): 10 mg via ORAL
  Filled 2021-03-28 (×9): qty 1

## 2021-03-28 MED ORDER — CYCLOBENZAPRINE HCL 10 MG PO TABS: 10.0000 mg | ORAL_TABLET | Freq: Three times a day (TID) | ORAL | 0 refills | Status: DC | PRN

## 2021-03-28 MED ORDER — ASPIRIN 325 MG PO TBEC
325.0000 mg | DELAYED_RELEASE_TABLET | Freq: Two times a day (BID) | ORAL | 0 refills | Status: DC
Start: 2021-03-28 — End: 2021-04-04

## 2021-03-28 NOTE — Progress Notes (Signed)
Physical Therapy Treatment Patient Details Name: Tracey Morgan MRN: [id number] DOB: [birth year] Today's Date: 03/28/2021    History of Present Illness Patient is 75 y.o. female s/p Rt THA anterior approach on 03/27/21 with PMH significant for neuromuscular disorder, GERD, OA, bakc surgery.    PT Comments    Pt ambulated 5' + 3' with RW, distance limited by R hip pain. Increased time for all mobility. She is not ready to DC home from PT standpoint.   Follow Up Recommendations  Follow surgeon's recommendation for DC plan and follow-up therapies     Equipment Recommendations  None recommended by PT    Recommendations for Other Services       Precautions / Restrictions Precautions Precautions: Fall Restrictions Weight Bearing Restrictions: No Other Position/Activity Restrictions: WBAT    Mobility  Bed Mobility Overal bed mobility: Needs Assistance Bed Mobility: Sit to Supine     Supine to sit: Mod assist     General bed mobility comments: mod A for LEs into bed    Transfers Overall transfer level: Needs assistance Equipment used: Rolling walker (2 wheeled) Transfers: Sit to/from [employer] Sit to Stand: Mod assist;From elevated surface Stand pivot transfers: Min assist       General transfer comment: mod A to power up, SPT bed to bedside commode  Ambulation/Gait Ambulation/Gait assistance: Min assist Gait Distance (Feet): 8 Feet Assistive device: Rolling walker (2 wheeled) Gait Pattern/deviations: Step-to pattern;Decreased step length - right;Decreased step length - left Gait velocity: decr   General Gait Details: 5' forward, 3' backward,  distance limited by fatigue/pain   Stairs             Wheelchair Mobility    Modified Rankin (Stroke Patients Only)       Balance Overall balance assessment: Needs assistance Sitting-balance support: Feet supported Sitting balance-Leahy Scale: Fair     Standing balance support: Bilateral  upper extremity supported;During functional activity Standing balance-Leahy Scale: Poor                              Cognition Arousal/Alertness: Awake/alert Behavior During Therapy: WFL for tasks assessed/performed Overall Cognitive Status: Within Functional Limits for tasks assessed                                        Exercises Total Joint Exercises Ankle Circles/Pumps: AROM;Both;10 reps;Supine Quad Sets: AROM;Right;5 reps;Supine Heel Slides: Right;10 reps;Supine;AAROM Hip ABduction/ADduction: AAROM;Right;10 reps;Supine    General Comments        Pertinent Vitals/Pain Pain Score: 8  Pain Location: Rt hip Pain Descriptors / Indicators: Aching;Discomfort Pain Intervention(s): Limited activity within patient's tolerance;Monitored during session;Patient requesting pain meds-RN notified;Ice applied    Home Living                      Prior Function            PT Goals (current goals can now be found in the care plan section) Acute Rehab PT Goals Patient Stated Goal: stop hurting, 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 PT Goal Formulation: With patient Time For Goal Achievement: 04/03/21 Potential to Achieve Goals: Good Progress towards PT goals: Progressing toward goals    Frequency    7X/week      PT Plan Current plan remains appropriate    Co-evaluation  AM-PAC PT "6 Clicks" Mobility   Outcome Measure  Help needed turning from your back to your side while in a flat bed without using bedrails?: A Little Help needed moving from lying on your back to sitting on the side of a flat bed without using bedrails?: A Lot Help needed moving to and from a bed to a chair (including a wheelchair)?: A Little Help needed standing up from a chair using your arms (e.g., wheelchair or bedside chair)?: A Little Help needed to walk in hospital room?: A Lot Help needed climbing 3-5 steps with a railing? : A Lot 6 Click  Score: 15    End of Session Equipment Utilized During Treatment: Gait belt Activity Tolerance: Patient limited by pain;Patient limited by fatigue Patient left: with call bell/phone within reach;in bed;with bed alarm set Nurse Communication: Mobility status PT Visit Diagnosis: Muscle weakness (generalized) (M62.81);Difficulty in walking, not elsewhere classified (R26.2)     Time: 0076-2263 PT Time Calculation (min) (ACUTE ONLY): 28 min  Charges:  $Gait Training: 8-22 mins  $Therapeutic Activity: 8-22 mins                     Ralene Bathe Kistler PT 03/28/2021  Acute Rehabilitation Services Pager [phone] Office [phone]

## 2021-03-28 NOTE — Progress Notes (Signed)
Physical Therapy Treatment Patient Details Name: Tracey Morgan MRN: [id number] DOB: [birth year] Today's Date: 03/28/2021    History of Present Illness Patient is 75 y.o. female s/p Rt THA anterior approach on 03/27/21 with PMH significant for neuromuscular disorder, GERD, OA, bakc surgery.    PT Comments    Mod assist for supine to sit. Min assist for stand pivot transfer x 2 (bed to bedside commode to recliner) with RW. Pt unable to ambulate 2* pain and fatigue. Initiated THA HEP. Pt is progressing slowly, she is not ready to DC home from PT standpoint.    Follow Up Recommendations  Follow surgeon's recommendation for DC plan and follow-up therapies     Equipment Recommendations  None recommended by PT    Recommendations for Other Services       Precautions / Restrictions Precautions Precautions: Fall Restrictions Weight Bearing Restrictions: No Other Position/Activity Restrictions: WBAT    Mobility  Bed Mobility Overal bed mobility: Needs Assistance Bed Mobility: Supine to Sit     Supine to sit: HOB elevated;Mod assist     General bed mobility comments: pt required cues for use of bed rail/sequencing to sit up EOB. Mod assist to bring bil LE's off EOB and raise trunk.    Transfers Overall transfer level: Needs assistance Equipment used: Rolling walker (2 wheeled) Transfers: Sit to/from [employer] Sit to Stand: Mod assist;From elevated surface Stand pivot transfers: Min assist       General transfer comment: mod A to power up, SPT bed to bedside commode, then to recliner. Ambulation deferred 2* lightheadedness. BP sitting 123/86. Pt fatigues quickly.  Ambulation/Gait Ambulation/Gait assistance: Min assist Gait Distance (Feet): 3 Feet Assistive device: Rolling walker (2 wheeled) Gait Pattern/deviations: Step-to pattern;Decreased step length - right;Decreased step length - left Gait velocity: decr   General Gait Details: several pivotal steps  for SPT x 2, distance limited by fatigue/pain   Stairs             Wheelchair Mobility    Modified Rankin (Stroke Patients Only)       Balance Overall balance assessment: Needs assistance Sitting-balance support: Feet supported Sitting balance-Leahy Scale: Fair     Standing balance support: Bilateral upper extremity supported;During functional activity Standing balance-Leahy Scale: Poor                              Cognition Arousal/Alertness: Awake/alert Behavior During Therapy: WFL for tasks assessed/performed Overall Cognitive Status: Within Functional Limits for tasks assessed                                        Exercises Total Joint Exercises Ankle Circles/Pumps: AROM;Both;10 reps;Supine Quad Sets: AROM;Right;5 reps;Supine Heel Slides: Right;10 reps;Supine;AAROM Hip ABduction/ADduction: AAROM;Right;10 reps;Supine    General Comments        Pertinent Vitals/Pain Pain Assessment: 0-10 Pain Score: 8  Pain Location: Rt hip Pain Descriptors / Indicators: Aching;Discomfort Pain Intervention(s): Limited activity within patient's tolerance;Monitored during session;Premedicated before session;Ice applied    Home Living                      Prior Function            PT Goals (current goals can now be found in the care plan section) Acute Rehab PT Goals Patient Stated Goal: stop hurting PT  Goal Formulation: With patient Time For Goal Achievement: 04/03/21 Potential to Achieve Goals: Good Progress towards PT goals: Progressing toward goals    Frequency    7X/week      PT Plan Current plan remains appropriate    Co-evaluation              AM-PAC PT "6 Clicks" Mobility   Outcome Measure  Help needed turning from your back to your side while in a flat bed without using bedrails?: A Little Help needed moving from lying on your back to sitting on the side of a flat bed without using bedrails?: A  Lot Help needed moving to and from a bed to a chair (including a wheelchair)?: A Little Help needed standing up from a chair using your arms (e.g., wheelchair or bedside chair)?: A Little Help needed to walk in hospital room?: A Lot Help needed climbing 3-5 steps with a railing? : A Lot 6 Click Score: 15    End of Session Equipment Utilized During Treatment: Gait belt Activity Tolerance: Patient limited by pain;Patient limited by fatigue Patient left: with call bell/phone within reach;in chair;with chair alarm set Nurse Communication: Mobility status PT Visit Diagnosis: Muscle weakness (generalized) (M62.81);Difficulty in walking, not elsewhere classified (R26.2)     Time: 3976-7341 PT Time Calculation (min) (ACUTE ONLY): 31 min  Charges:  $Therapeutic Exercise: 8-22 mins $Therapeutic Activity: 8-22 mins                     Ralene Bathe Kistler PT 03/28/2021  Acute Rehabilitation Services Pager [phone] Office [phone]

## 2021-03-28 NOTE — Progress Notes (Signed)
Foley discontinued per MD order. Patient due to void by 12:15pm today 03/28/21.

## 2021-03-28 NOTE — Plan of Care (Signed)
  Problem: Health Behavior/Discharge Planning: Goal: Ability to manage health-related needs will improve Outcome: Progressing   Problem: Clinical Measurements: Goal: Ability to maintain clinical measurements within normal limits will improve Outcome: Progressing Goal: Will remain free from infection Outcome: Progressing Goal: Diagnostic test results will improve Outcome: Progressing Goal: Respiratory complications will improve Outcome: Progressing Goal: Cardiovascular complication will be avoided Outcome: Progressing   Problem: Activity: Goal: Risk for activity intolerance will decrease Outcome: Progressing   Problem: Nutrition: Goal: Adequate nutrition will be maintained Outcome: Progressing   Problem: Coping: Goal: Level of anxiety will decrease Outcome: Progressing   Problem: Elimination: Goal: Will not experience complications related to bowel motility Outcome: Progressing Goal: Will not experience complications related to urinary retention Outcome: Progressing   Problem: Pain Managment: Goal: General experience of comfort will improve Outcome: Progressing   Problem: Safety: Goal: Ability to remain free from injury will improve Outcome: Progressing   Problem: Skin Integrity: Goal: Risk for impaired skin integrity will decrease Outcome: Progressing   Problem: Education: Goal: Knowledge of the prescribed therapeutic regimen will improve Outcome: Progressing Goal: Understanding of discharge needs will improve Outcome: Progressing   Problem: Activity: Goal: Ability to avoid complications of mobility impairment will improve Outcome: Progressing Goal: Ability to tolerate increased activity will improve Outcome: Progressing   Problem: Clinical Measurements: Goal: Postoperative complications will be avoided or minimized Outcome: Progressing   Problem: Pain Management: Goal: Pain level will decrease with appropriate interventions Outcome: Progressing    Problem: Skin Integrity: Goal: Will show signs of wound healing Outcome: Progressing   

## 2021-03-28 NOTE — Care Management Obs Status (Signed)
MEDICARE OBSERVATION STATUS NOTIFICATION   Patient Details  Name: Tracey Morgan MRN: [id number] Date of Birth: [birth year]   Medicare Observation Status Notification Given:  Yes    Ewing Schlein, LCSW 03/28/2021, 10:14 AM

## 2021-03-28 NOTE — Progress Notes (Signed)
   Subjective: 1 Day Post-Op Procedure(s) (LRB): TOTAL HIP ARTHROPLASTY ANTERIOR APPROACH (Right) Patient reports pain as mild.   Patient seen in rounds by Dr. Lequita Halt. Patient is well, but has had some minor complaints of muscle spasms. No acute overnight events. Denies SOB, chest pain, or calf pain. Foley pulled this am.  We will continue therapy today.   Objective: Vital signs in last 24 hours: Temp:  [97.7 F (36.5 C)-98.6 F (37 C)] 98 F (36.7 C) (04/21 0628) Pulse Rate:  [76-92] 76 (04/21 0628) Resp:  [11-18] 18 (04/21 0628) BP: (119-174)/(57-101) 121/67 (04/21 0628) SpO2:  [99 %-100 %] 100 % (04/21 0628) Weight:  [88 kg] 88 kg (04/20 0906)  Intake/Output from previous day:  Intake/Output Summary (Last 24 hours) at 03/28/2021 0745 Last data filed at 03/28/2021 0356 Gross per 24 hour  Intake 3144.76 ml  Output 1300 ml  Net 1844.76 ml     Intake/Output this shift: No intake/output data recorded.  Labs: Recent Labs    03/28/21 0325  HGB 10.1*   Recent Labs    03/28/21 0325  WBC 10.7*  RBC 3.16*  HCT 30.4*  PLT 270   Recent Labs    03/28/21 0325  NA 139  K 3.9  CL 107  CO2 26  BUN 10  CREATININE 0.69  GLUCOSE 157*  CALCIUM 8.8*   No results for input(s): LABPT, INR in the last 72 hours.  Exam: General - Patient is Alert and Oriented Extremity - Neurologically intact Neurovascular intact Sensation intact distally Dorsiflexion/Plantar flexion intact Dressing - dressing C/D/I Motor Function - intact, moving foot and toes well on exam.   Past Medical History:  Diagnosis Date  . Arthritis    back, hips  . Diverticulitis    under control  . Eczema   . GERD (gastroesophageal reflux disease)   . Heart murmur   . History of kidney stones   . Neuromuscular disorder (HCC)    Rt leg   . PONV (postoperative nausea and vomiting)   . Pre-diabetes     Assessment/Plan: 1 Day Post-Op Procedure(s) (LRB): TOTAL HIP ARTHROPLASTY ANTERIOR APPROACH  (Right) Principal Problem:   OA (osteoarthritis) of hip Active Problems:   Primary osteoarthritis of right hip  Estimated body mass index is 35.48 kg/m as calculated from the following:   Height as of this encounter: 5\' 2"  (1.575 m).   Weight as of this encounter: 88 kg. Advance diet Up with therapy  DVT Prophylaxis - Aspirin and TED hose Weight bearing as tolerated. Continue therapy.  Plan is to go Home after hospital stay.  Will switch her muscle relaxer from Robaxin to Flexeril to help with spasms.   Plan for two sessions of PT today, and if meeting goals, will plan for discharge today.  Patient to follow up in two weeks with Dr. .   The PDMP database was reviewed today prior to any opioid medications being prescribed to this patient.   Lequita Halt, MBA, PA-C Orthopedic Surgery 03/28/2021, 7:45 AM

## 2021-03-28 NOTE — TOC Initial Note (Signed)
Transition of Care [facility]) - Initial/Assessment Note   Patient Details  Name: Tracey Morgan MRN: [id number] Date of Birth: Aug 01, 1946  Transition of [facility]) CM/SW Contact:    Sherie Don, LCSW Phone Number: 03/28/2021, 10:20 AM  Clinical Narrative: Patient is a 75 year old female who is under observation for OA (osteoarthritis) of hip. CSW met with patient to discuss discharge needs. Patient expected to discharge home with a home exercise program tomorrow as she continues to experience significant pain. Patient confirmed she has a rolling walker and cane at home. TOC to follow.  Expected Discharge Plan: Home/Self Care Barriers to Discharge: Continued Medical Work up  Patient Goals and CMS Choice Patient states their goals for this hospitalization and ongoing recovery are:: Discharge home with home exercise program Choice offered to / list presented to : NA  Expected Discharge Plan and Services Expected Discharge Plan: Home/Self Care In-house Referral: Clinical Social Work Post Acute Care Choice: NA Living arrangements for the past 2 months: Single Family Home Expected Discharge Date: 03/28/21               DME Arranged: N/A DME Agency: NA  Prior Living Arrangements/Services Living arrangements for the past 2 months: Single Family Home Lives with:: Self Patient language and need for interpreter reviewed:: Yes Do you feel safe going back to the place where you live?: Yes      Need for Family Participation in Patient Care: No (Comment) Care giver support system in place?: Yes (comment) Current home services: DME (Rolling walker, cane) Criminal Activity/Legal Involvement Pertinent to Current Situation/Hospitalization: No - Comment as needed  Activities of Daily Living Home Assistive Devices/Equipment: Cane (specify quad or straight),Walker (specify type),Built-in shower seat,Raised toilet seat with rails ADL Screening (condition at time of admission) Patient's cognitive  ability adequate to safely complete daily activities?: Yes Is the patient deaf or have difficulty hearing?: No Does the patient have difficulty seeing, even when wearing glasses/contacts?: No Does the patient have difficulty concentrating, remembering, or making decisions?: No Patient able to express need for assistance with ADLs?: Yes Does the patient have difficulty dressing or bathing?: No Independently performs ADLs?: Yes (appropriate for developmental age) Does the patient have difficulty walking or climbing stairs?: Yes Weakness of Legs: Right Weakness of Arms/Hands: None  Emotional Assessment Appearance:: Appears stated age Attitude/Demeanor/Rapport: Engaged Affect (typically observed): Accepting Orientation: : Oriented to Self,Oriented to Place,Oriented to  Time,Oriented to Situation Alcohol / Substance Use: Not Applicable Psych Involvement: No (comment)  Admission diagnosis:  Primary osteoarthritis of right hip [M16.11] Patient Active Problem List   Diagnosis Date Noted  . OA (osteoarthritis) of hip 03/27/2021  . Primary osteoarthritis of right hip 03/27/2021  . 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   PCP:  Glendon Axe, MD Pharmacy:   [place], [place] - [address] [place] [address] Phone: [phone] Fax: [phone]  Readmission Risk Interventions No flowsheet data found.

## 2021-03-29 LAB — CBC
HCT: 28.9 % — ABNORMAL LOW (ref 36.0–46.0)
Hemoglobin: 9.6 g/dL — ABNORMAL LOW (ref 12.0–15.0)
MCH: 32 pg (ref 26.0–34.0)
MCHC: 33.2 g/dL (ref 30.0–36.0)
MCV: 96.3 fL (ref 80.0–100.0)
Platelets: 224 10*3/uL (ref 150–400)
RBC: 3 MIL/uL — ABNORMAL LOW (ref 3.87–5.11)
RDW: 12 % (ref 11.5–15.5)
WBC: 10.7 10*3/uL — ABNORMAL HIGH (ref 4.0–10.5)
nRBC: 0 % (ref 0.0–0.2)

## 2021-03-29 LAB — BASIC METABOLIC PANEL
Anion gap: 6 (ref 5–15)
BUN: 14 mg/dL (ref 8–23)
CO2: 27 mmol/L (ref 22–32)
Calcium: 8.9 mg/dL (ref 8.9–10.3)
Chloride: 105 mmol/L (ref 98–111)
Creatinine, Ser: 0.57 mg/dL (ref 0.44–1.00)
GFR, Estimated: >60 mL/min (ref >60)
Glucose, Bld: 162 mg/dL — ABNORMAL HIGH (ref 70–99)
Potassium: 4 mmol/L (ref 3.5–5.1)
Sodium: 138 mmol/L (ref 135–145)

## 2021-03-29 MED ORDER — GABAPENTIN 100 MG PO CAPS: 100.0000 mg | ORAL_CAPSULE | Freq: Two times a day (BID) | ORAL | 1 refills | Status: DC

## 2021-03-29 MED ORDER — GABAPENTIN 100 MG PO CAPS
100.0000 mg | ORAL_CAPSULE | Freq: Two times a day (BID) | ORAL | Status: DC
  Administered 2021-03-29 – 2021-04-04 (×13): 100 mg via ORAL
  Filled 2021-03-29 (×13): qty 1

## 2021-03-29 NOTE — Plan of Care (Signed)
Plan of care reviewed and discussed with the patient. 

## 2021-03-29 NOTE — Progress Notes (Signed)
Physical Therapy Treatment Patient Details Name: Tracey Morgan MRN: [id number] DOB: [birth year] Today's Date: 03/29/2021    History of Present Illness Patient is 75 y.o. female s/p Rt THA anterior approach on 03/27/21 with PMH significant for neuromuscular disorder, GERD, OA, bakc surgery.    PT Comments    POD #2 pm session Assisted OOB to [facility] then amb an increased distance around room.  Pt required increased time.  Assisted back to bed and performed a few TE's  Pt has not met goals to D/C today.     Follow Up Recommendations  Follow surgeon's recommendation for DC plan and follow-up therapies     Equipment Recommendations  None recommended by PT    Recommendations for Other Services       Precautions / Restrictions Precautions Precautions: Fall Restrictions Weight Bearing Restrictions: No Other Position/Activity Restrictions: WBAT    Mobility  Bed Mobility Overal bed mobility: Needs Assistance Bed Mobility: Supine to Sit;Sit to Supine     Supine to sit: Min assist;Mod assist Sit to supine: Mod assist;Max assist   General bed mobility comments: demonstarted and instructed how to use belt loop to self assist R LE.  Required increased effort and increased time to complete.    Transfers Overall transfer level: Needs assistance Equipment used: Rolling walker (2 wheeled) Transfers: Sit to/from [facility] Sit to Stand: Mod assist;From elevated surface;Min assist Stand pivot transfers: Min assist       General transfer comment: 25% VC's on proper hand placement and increased effort to complete due to pain.  Ambulation/Gait Ambulation/Gait assistance: Supervision;Min guard Gait Distance (Feet): 15 Feet Assistive device: Rolling walker (2 wheeled) Gait Pattern/deviations: Step-to pattern;Decreased step length - right;Decreased step length - left Gait velocity: decr   General Gait Details: tolerated an increased distance but only amb around room  due to pain and effort esp with R LE advancement.   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   Total Hip Replacement TE's following HEP Handout 10 reps ankle pumps 05 reps knee presses 05 reps heel slides  Instructed how to use a belt loop to assist  Followed by ICE    General Comments        Pertinent Vitals/Pain Pain Assessment: 0-10 Pain Score: 9  Pain Location: Rt hip Pain Descriptors / Indicators: Aching;Discomfort Pain Intervention(s): Monitored during session;Patient requesting pain meds-RN notified;Ice applied;Repositioned    Home Living                      Prior Function            PT Goals (current goals can now be found in the care plan section) Progress towards PT goals: Progressing toward goals  Frequency    7X/week      PT Plan Current plan remains appropriate    Co-evaluation              AM-PAC PT "6 Clicks" Mobility   Outcome Measure  Help needed turning from your back to your side while in a flat bed without using bedrails?: A Little Help needed moving from lying on your back to sitting on the side of a flat bed without using bedrails?: A Little Help needed moving to and from a bed to a chair (including a wheelchair)?: A Little Help needed standing up from a chair using your arms (e.g., wheelchair or bedside chair)?: A Little Help needed to walk in hospital room?: A Little Help needed climbing 3-5 steps with a railing? : A Lot 6 Click Score: 17    End of Session Equipment Utilized During Treatment: Gait  belt Activity Tolerance: Patient limited by pain;Patient limited by fatigue Patient left: with call bell/phone within reach;with bed alarm set;in chair Nurse Communication: Mobility status PT Visit Diagnosis: Muscle weakness (generalized) (M62.81);Difficulty in walking, not elsewhere classified (R26.2)     Time: 1533-1600 PT Time Calculation (min) (ACUTE ONLY): 27 min  Charges:  $Gait Training: 8-22 mins $Therapeutic Activity: 8-22 mins                     Rica Koyanagi  PTA Acute  Rehabilitation Services Pager      [phone] Office      [phone]

## 2021-03-29 NOTE — Progress Notes (Signed)
Physical Therapy Treatment Patient Details Name: Tracey Morgan MRN: [id number] DOB: Aug 30, 1946 Today's Date: 03/29/2021    History of Present Illness Patient is 75 y.o. female s/p Rt THA anterior approach on 03/27/21 with PMH significant for neuromuscular disorder, GERD, OA, bakc surgery.    PT Comments    POD # 2 am session Assisted OOB required increased time and much effort.  Assisted to [facility] as pt was unable to amb to bathroom.  Assisted off BSC and attempted gait however pt was only able to progress 3 feet due to 10/10 hip pain.  Positioned in recliner and applied ICE.   Follow Up Recommendations  Follow surgeon's recommendation for DC plan and follow-up therapies     Equipment Recommendations  None recommended by PT    Recommendations for Other Services       Precautions / Restrictions Precautions Precautions: Fall Restrictions Weight Bearing Restrictions: No Other Position/Activity Restrictions: WBAT    Mobility  Bed Mobility Overal bed mobility: Needs Assistance Bed Mobility: Supine to Sit     Supine to sit: Min assist;Mod assist     General bed mobility comments: demonstarted and instructed how to use belt loop to self assist R LE.  Required increased effort and increased time to complete.    Transfers Overall transfer level: Needs assistance Equipment used: Rolling walker (2 wheeled) Transfers: Sit to/from [employer] Sit to Stand: Mod assist;From elevated surface;Min assist Stand pivot transfers: Min assist       General transfer comment: 25% VC's on proper hand placement and increased effort to complete due to pain.  Ambulation/Gait Ambulation/Gait assistance: Supervision;Min guard Gait Distance (Feet): 3 Feet Assistive device: Rolling walker (2 wheeled) Gait Pattern/deviations: Step-to pattern;Decreased step length - right;Decreased step length - left Gait velocity: decr   General Gait Details: very limited distance due to 10/10  pain   Stairs             Wheelchair Mobility    Modified Rankin (Stroke Patients Only)       Balance                                            Cognition Arousal/Alertness: Awake/alert Behavior During Therapy: WFL for tasks assessed/performed Overall Cognitive Status: Within Functional Limits for tasks assessed                                 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        Pertinent Vitals/Pain Pain Assessment: 0-10 Pain Score: 9  Pain Location: Rt hip Pain Descriptors / Indicators: Aching;Discomfort Pain Intervention(s): Monitored during session;Patient requesting pain meds-RN notified;Ice applied;Repositioned    Home Living                      Prior Function            PT Goals (current goals can now be found in the care plan section) Progress towards PT goals: Progressing toward goals    Frequency    7X/week      PT Plan Current plan remains appropriate    Co-evaluation              AM-PAC PT "6  Clicks" Mobility   Outcome Measure  Help needed turning from your back to your side while in a flat bed without using bedrails?: A Little Help needed moving from lying on your back to sitting on the side of a flat bed without using bedrails?: A Little Help needed moving to and from a bed to a chair (including a wheelchair)?: A Little Help needed standing up from a chair using your arms (e.g., wheelchair or bedside chair)?: A Little Help needed to walk in hospital room?: A Little Help needed climbing 3-5 steps with a railing? : A Little 6 Click Score: 18    End of Session Equipment Utilized During Treatment: Gait belt Activity Tolerance: Patient limited by pain;Patient limited by fatigue Patient left: with call bell/phone within reach;with bed alarm set;in chair Nurse Communication: Mobility status PT Visit Diagnosis: Muscle  weakness (generalized) (M62.81);Difficulty in walking, not elsewhere classified (R26.2)     Time: 0177-9390 PT Time Calculation (min) (ACUTE ONLY): 32 min  Charges:  $Gait Training: 8-22 mins $Therapeutic Activity: 8-22 mins                     Felecia Shelling  PTA Acute  Rehabilitation Services Pager      [phone] Office      [phone]

## 2021-03-29 NOTE — Progress Notes (Signed)
   Subjective: 2 Days Post-Op Procedure(s) (LRB): TOTAL HIP ARTHROPLASTY ANTERIOR APPROACH (Right) Patient reports pain as mild.   Patient seen in rounds by Dr. Lequita Halt. Patient is having issues with back and nerve-type pain. Denies chest pain or SOB. No issues overnight. Will start on 100 mg gabapentin BID.  Plan is to go Home after hospital stay.  Objective: Vital signs in last 24 hours: Temp:  [97.6 F (36.4 C)-98.2 F (36.8 C)] 97.6 F (36.4 C) (04/22 0536) Pulse Rate:  [72-79] 72 (04/22 0536) Resp:  [16-17] 16 (04/22 0536) BP: (103-130)/(57-71) 130/71 (04/22 0536) SpO2:  [100 %] 100 % (04/22 0536)  Intake/Output from previous day:  Intake/Output Summary (Last 24 hours) at 03/29/2021 0702 Last data filed at 03/29/2021 0330 Gross per 24 hour  Intake 700 ml  Output 775 ml  Net -75 ml    Intake/Output this shift: No intake/output data recorded.  Labs: Recent Labs    03/28/21 0325 03/29/21 0318  HGB 10.1* 9.6*   Recent Labs    03/28/21 0325 03/29/21 0318  WBC 10.7* 10.7*  RBC 3.16* 3.00*  HCT 30.4* 28.9*  PLT 270 224   Recent Labs    03/28/21 0325 03/29/21 0318  NA 139 138  K 3.9 4.0  CL 107 105  CO2 26 27  BUN 10 14  CREATININE 0.69 0.57  GLUCOSE 157* 162*  CALCIUM 8.8* 8.9   No results for input(s): LABPT, INR in the last 72 hours.  Exam: General - Patient is Alert and Oriented Extremity - Neurologically intact Neurovascular intact Sensation intact distally Dorsiflexion/Plantar flexion intact Dressing/Incision - clean, dry, no drainage Motor Function - intact, moving foot and toes well on exam.   Past Medical History:  Diagnosis Date  . Arthritis    back, hips  . Diverticulitis    under control  . Eczema   . GERD (gastroesophageal reflux disease)   . Heart murmur   . History of kidney stones   . Neuromuscular disorder (HCC)    Rt leg   . PONV (postoperative nausea and vomiting)   . Pre-diabetes     Assessment/Plan: 2 Days  Post-Op Procedure(s) (LRB): TOTAL HIP ARTHROPLASTY ANTERIOR APPROACH (Right) Principal Problem:   OA (osteoarthritis) of hip Active Problems:   Primary osteoarthritis of right hip  Estimated body mass index is 35.48 kg/m as calculated from the following:   Height as of this encounter: 5\' 2"  (1.575 m).   Weight as of this encounter: 88 kg. Up with therapy D/C IV fluids  DVT Prophylaxis - Aspirin Weight-bearing as tolerated  Plan for discharge to home with HEP later today once cleared with PT, had slow progression yesterday. Follow-up in the office in 2 weeks.  , PA-C Orthopedic Surgery [phone] 03/29/2021, 7:02 AM

## 2021-03-30 DIAGNOSIS — K219 Gastro-esophageal reflux disease without esophagitis: Secondary | ICD-10-CM | POA: Diagnosis present

## 2021-03-30 DIAGNOSIS — Z91048 Other nonmedicinal substance allergy status: Secondary | ICD-10-CM | POA: Diagnosis not present

## 2021-03-30 DIAGNOSIS — Z9181 History of falling: Secondary | ICD-10-CM | POA: Diagnosis not present

## 2021-03-30 DIAGNOSIS — Z885 Allergy status to narcotic agent status: Secondary | ICD-10-CM | POA: Diagnosis not present

## 2021-03-30 DIAGNOSIS — M25751 Osteophyte, right hip: Secondary | ICD-10-CM | POA: Diagnosis present

## 2021-03-30 DIAGNOSIS — Z87442 Personal history of urinary calculi: Secondary | ICD-10-CM | POA: Diagnosis not present

## 2021-03-30 DIAGNOSIS — Z79899 Other long term (current) drug therapy: Secondary | ICD-10-CM | POA: Diagnosis not present

## 2021-03-30 DIAGNOSIS — M1611 Unilateral primary osteoarthritis, right hip: Secondary | ICD-10-CM | POA: Diagnosis present

## 2021-03-30 DIAGNOSIS — Z20822 Contact with and (suspected) exposure to covid-19: Secondary | ICD-10-CM | POA: Diagnosis present

## 2021-03-30 DIAGNOSIS — Z888 Allergy status to other drugs, medicaments and biological substances status: Secondary | ICD-10-CM | POA: Diagnosis not present

## 2021-03-30 DIAGNOSIS — R7303 Prediabetes: Secondary | ICD-10-CM | POA: Diagnosis present

## 2021-03-30 LAB — CBC
HCT: 29.1 % — ABNORMAL LOW (ref 36.0–46.0)
Hemoglobin: 9.3 g/dL — ABNORMAL LOW (ref 12.0–15.0)
MCH: 31.5 pg (ref 26.0–34.0)
MCHC: 32 g/dL (ref 30.0–36.0)
MCV: 98.6 fL (ref 80.0–100.0)
Platelets: 233 10*3/uL (ref 150–400)
RBC: 2.95 MIL/uL — ABNORMAL LOW (ref 3.87–5.11)
RDW: 12.3 % (ref 11.5–15.5)
WBC: 9.1 10*3/uL (ref 4.0–10.5)
nRBC: 0 % (ref 0.0–0.2)

## 2021-03-30 NOTE — Progress Notes (Signed)
Physical Therapy Treatment Patient Details Name: Tracey Morgan MRN: [id number] DOB: 02-18-1946 Today's Date: 03/30/2021    History of Present Illness Patient is 75 y.o. female s/p Rt THA anterior approach on 03/27/21 with PMH significant for neuromuscular disorder, GERD, OA, bakc surgery.    PT Comments    Pt tolerated increased ambulation distance of 24' with RW, no loss of balance. Distance limited by pain and fatigue.   Follow Up Recommendations  Follow surgeon's recommendation for DC plan and follow-up therapies     Equipment Recommendations  None recommended by PT    Recommendations for Other Services       Precautions / Restrictions Precautions Precautions: Fall Restrictions Weight Bearing Restrictions: No Other Position/Activity Restrictions: WBAT    Mobility  Bed Mobility Overal bed mobility: Needs Assistance Bed Mobility: Sit to Supine     Supine to sit: Mod assist     General bed mobility comments: mod A for LEs into bed    Transfers Overall transfer level: Needs assistance Equipment used: Rolling walker (2 wheeled) Transfers: Sit to/from Stand Sit to Stand: From elevated surface;Min assist         General transfer comment: VCs hand placement, min A to power up  Ambulation/Gait Ambulation/Gait assistance: Min guard Gait Distance (Feet): 26 Feet Assistive device: Rolling walker (2 wheeled) Gait Pattern/deviations: Step-to pattern;Decreased step length - right;Decreased step length - left;Trunk flexed Gait velocity: decr   General Gait Details: VCs for sequencing, difficulty advancing RLE (pt flexes R toes to help advance RLE), distance limited bypain/fatigue   Stairs             Wheelchair Mobility    Modified Rankin (Stroke Patients Only)       Balance Overall balance assessment: Needs assistance Sitting-balance support: Feet supported Sitting balance-Leahy Scale: Fair     Standing balance support: Bilateral upper extremity  supported;During functional activity Standing balance-Leahy Scale: Poor                              Cognition Arousal/Alertness: Awake/alert Behavior During Therapy: WFL for tasks assessed/performed Overall Cognitive Status: Within Functional Limits for tasks assessed                                 General 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        Pertinent Vitals/Pain Pain Score: 8  Pain Location: Rt hip with walking Pain Descriptors / Indicators: Aching;Discomfort Pain Intervention(s): Limited activity within patient's tolerance;Monitored during session;Premedicated before session;Ice applied    Home Living                      Prior Function            PT Goals (current goals can now be found in the care plan section) Acute Rehab PT Goals Patient Stated Goal: stop hurting, 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 PT Goal Formulation: With patient Time For Goal Achievement: 04/03/21 Potential to Achieve Goals: Good Progress towards PT goals: Progressing toward goals    Frequency    7X/week      PT Plan Current plan remains appropriate    Co-evaluation              AM-PAC PT "6 Clicks" Mobility  Outcome Measure  Help needed turning from your back to your side while in a flat bed without using bedrails?: A Little Help needed moving from lying on your back to sitting on the side of a flat bed without using bedrails?: A Lot Help needed moving to and from a bed to a chair (including a wheelchair)?: A Little Help needed standing up from a chair using your arms (e.g., wheelchair or bedside chair)?: A Little Help needed to walk in hospital room?: A Little Help needed climbing 3-5 steps with a railing? : A Lot 6 Click Score: 16    End of Session Equipment Utilized During Treatment: Gait belt Activity Tolerance: Patient limited by pain;Patient limited by  fatigue Patient left: with call bell/phone within reach;in bed;with bed alarm set Nurse Communication: Mobility status PT Visit Diagnosis: Muscle weakness (generalized) (M62.81);Difficulty in walking, not elsewhere classified (R26.2)     Time: 4158-3094 PT Time Calculation (min) (ACUTE ONLY): 26 min  Charges:  $Gait Training: 8-22 mins $Therapeutic Activity: 8-22 mins                     Ralene Bathe Kistler PT 03/30/2021  Acute Rehabilitation Services Pager [phone] Office [phone]

## 2021-03-30 NOTE — Progress Notes (Signed)
Physical Therapy Treatment Patient Details Name: Tracey Morgan MRN: [id number] DOB: [birth year] Today's Date: 03/30/2021    History of Present Illness Patient is 75 y.o. female s/p Rt THA anterior approach on 03/27/21 with PMH significant for neuromuscular disorder, GERD, OA, bakc surgery.    PT Comments    Pt is progressing slowly with mobility. She requires mod assist for supine to sit, min assist for sit to stand. She ambulated 10' with RW, distance limited by lightheadedness, pain, and fatigue. BP in sitting immediately after walking was 134/70. Performed R THA exercises with assistance. Pt reports she does not have 24* assistance available at home. Her daughter can stay with her overnight, but she doesn't have assistance available during the day. Pt is not ready to DC home from PT standpoint. ST-SNF may be appropriate 2* slow progress and lack of 24* assistance at home.    Follow Up Recommendations  Follow surgeon's recommendation for DC plan and follow-up therapies     Equipment Recommendations  None recommended by PT    Recommendations for Other Services       Precautions / Restrictions Precautions Precautions: Fall Restrictions Weight Bearing Restrictions: No Other Position/Activity Restrictions: WBAT    Mobility  Bed Mobility Overal bed mobility: Needs Assistance Bed Mobility: Supine to Sit     Supine to sit: Mod assist;HOB elevated     General bed mobility comments: demonstarted and instructed how to use belt loop to self assist R LE.  Required increased effort and increased time to complete. Mod A to pivot hips with pad to EOB, and to advance/support RLE. Used rails to pull up.    Transfers Overall transfer level: Needs assistance Equipment used: Rolling walker (2 wheeled) Transfers: Sit to/from Stand Sit to Stand: From elevated surface;Min assist         General transfer comment: VCs hand placement, min A to power up  Ambulation/Gait Ambulation/Gait  assistance: Min guard Gait Distance (Feet): 10 Feet Assistive device: Rolling walker (2 wheeled) Gait Pattern/deviations: Step-to pattern;Decreased step length - right;Decreased step length - left Gait velocity: decr   General Gait Details: VCs for sequencing, difficulty advancing RLE (pt flexes R toes to help advance RLE), distance limited by lightheadedness/pain/fatigue, BP 134/70 in sitting   Stairs             Wheelchair Mobility    Modified Rankin (Stroke Patients Only)       Balance Overall balance assessment: Needs assistance Sitting-balance support: Feet supported Sitting balance-Leahy Scale: Fair     Standing balance support: Bilateral upper extremity supported;During functional activity Standing balance-Leahy Scale: Poor                              Cognition Arousal/Alertness: Awake/alert Behavior During Therapy: WFL for tasks assessed/performed Overall Cognitive Status: Within Functional Limits for tasks assessed                                 General Comments: AxO x 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 Exercises Ankle Circles/Pumps: AROM;Both;10 reps;Supine Quad Sets: AROM;5 reps;Supine;Both Short Arc Quad: AROM;Right;10 reps;Supine Heel Slides: Right;10 reps;Supine;AAROM Hip ABduction/ADduction: AAROM;Right;10 reps;Supine    General Comments        Pertinent Vitals/Pain      Home Living  Prior Function            PT Goals (current goals can now be found in the care plan section) Acute Rehab PT Goals Patient Stated Goal: stop hurting, 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 PT Goal Formulation: With patient Time For Goal Achievement: 04/03/21 Potential to Achieve Goals: Good Progress towards PT goals: Progressing toward goals    Frequency    7X/week      PT Plan Current plan remains appropriate    Co-evaluation              AM-PAC PT "6  Clicks" Mobility   Outcome Measure  Help needed turning from your back to your side while in a flat bed without using bedrails?: A Little Help needed moving from lying on your back to sitting on the side of a flat bed without using bedrails?: A Lot Help needed moving to and from a bed to a chair (including a wheelchair)?: A Little Help needed standing up from a chair using your arms (e.g., wheelchair or bedside chair)?: A Little Help needed to walk in hospital room?: A Little Help needed climbing 3-5 steps with a railing? : A Lot 6 Click Score: 16    End of Session Equipment Utilized During Treatment: Gait belt Activity Tolerance: Patient limited by pain;Patient limited by fatigue Patient left: with call bell/phone within reach;in chair;with chair alarm set Nurse Communication: Mobility status PT Visit Diagnosis: Muscle weakness (generalized) (M62.81);Difficulty in walking, not elsewhere classified (R26.2)     Time: 5631-4970 PT Time Calculation (min) (ACUTE ONLY): 31 min  Charges:  $Gait Training: 8-22 mins $Therapeutic Exercise: 8-22 mins                    Ralene Bathe Kistler PT 03/30/2021  Acute Rehabilitation Services Pager [phone] Office [phone]

## 2021-03-31 MED ORDER — KETOROLAC TROMETHAMINE 15 MG/ML IJ SOLN
7.5000 mg | Freq: Four times a day (QID) | INTRAMUSCULAR | Status: AC
  Administered 2021-03-31 (×2): 7.5 mg via INTRAVENOUS
  Filled 2021-03-31 (×2): qty 1

## 2021-03-31 NOTE — Plan of Care (Signed)
  Problem: Activity: Goal: Risk for activity intolerance will decrease Outcome: Progressing   Problem: Coping: Goal: Level of anxiety will decrease Outcome: Progressing   Problem: Pain Managment: Goal: General experience of comfort will improve Outcome: Progressing   

## 2021-03-31 NOTE — Progress Notes (Signed)
Physical Therapy Treatment Patient Details Name: Tracey Morgan MRN: [id number] DOB: 08-21-1946 Today's Date: 03/31/2021    History of Present Illness Patient is 75 y.o. female s/p R THA-DA approach on 03/27/21 with PMH significant for neuromuscular disorder, GERD, OA, remote back surgery.    PT Comments    Pt continues to progress slowly-limited by pain and fatigue. Pt c/o some dizziness-BP 142/75 after ambulation. She requires some encouragement to work through the pain and to progress activity. Will continue to follow and work towards meeting PT goals. May need HHPT f/u and a home health aide.    Follow Up Recommendations  Follow surgeon's recommendation for DC plan and follow-up therapies;Supervision - Intermittent (PT recommends HHPT and possibly a home health aide)     Equipment Recommendations  None recommended by PT    Recommendations for Other Services       Precautions / Restrictions Precautions Precautions: Fall Restrictions Weight Bearing Restrictions: No Other Position/Activity Restrictions: WBAT    Mobility  Bed Mobility Overal bed mobility: Needs Assistance Bed Mobility: Supine to Sit     Supine to sit: Min assist Sit to supine: HOB elevated   General bed mobility comments: Assist to place gait belt (as leg lifter) on foot. Pt then able to get to EOB with increased time.    Transfers Overall transfer level: Needs assistance Equipment used: Rolling walker (2 wheeled) Transfers: Sit to/from Stand Sit to Stand: From elevated surface;Min assist         General transfer comment: A to power up. Increased time. Cues for safety, technique.  Ambulation/Gait Ambulation/Gait assistance: Min guard Gait Distance (Feet): 30 Feet (15'x2, 30'x1) Assistive device: Rolling walker (2 wheeled) Gait Pattern/deviations: Step-to pattern;Step-through pattern;Decreased stride length;Decreased step length - right;Decreased weight shift to left     General Gait Details:  Cues for safety, posture, step length on R. Intermittent scooting of foot on R (2* pain) but pt IS ABLE to advance R LE. Slow gait speed. 1 seated break 2* c/o dizziness-BP 142/75. Followed with recliner and used it to transport pt back to room.   Stairs             Wheelchair Mobility    Modified Rankin (Stroke Patients Only)       Balance Overall balance assessment: Needs assistance         Standing balance support: Bilateral upper extremity supported Standing balance-Leahy Scale: Poor                              Cognition Arousal/Alertness: Awake/alert Behavior During Therapy: WFL for tasks assessed/performed Overall Cognitive Status: Within Functional Limits for tasks assessed                                        Exercises Total Joint Exercises Ankle Circles/Pumps: AROM;Both;10 reps Quad Sets: AROM;Both;10 reps Heel Slides: AAROM;Right;10 reps Hip ABduction/ADduction: AAROM;Right;10 reps    General Comments        Pertinent Vitals/Pain Pain Assessment: 0-10 Pain Score: 7  Pain Location: R groin, thigh Pain Descriptors / Indicators: Burning;Discomfort;Tightness Pain Intervention(s): Limited activity within patient's tolerance;Monitored during session;Repositioned;Ice applied    Home Living                      Prior Function  PT Goals (current goals can now be found in the care plan section) Progress towards PT goals: Progressing toward goals    Frequency    7X/week      PT Plan Current plan remains appropriate    Co-evaluation              AM-PAC PT "6 Clicks" Mobility   Outcome Measure  Help needed turning from your back to your side while in a flat bed without using bedrails?: A Little Help needed moving from lying on your back to sitting on the side of a flat bed without using bedrails?: A Little Help needed moving to and from a bed to a chair (including a wheelchair)?: A  Little Help needed standing up from a chair using your arms (e.g., wheelchair or bedside chair)?: A Little Help needed to walk in hospital room?: A Little Help needed climbing 3-5 steps with a railing? : A Lot 6 Click Score: 17    End of Session Equipment Utilized During Treatment: Gait belt Activity Tolerance: Patient limited by fatigue;Patient limited by pain Patient left: in chair;with call bell/phone within reach   PT Visit Diagnosis: Other abnormalities of gait and mobility (R26.89);Pain Pain - Right/Left: Right Pain - part of body: Hip     Time: 8657-8469 PT Time Calculation (min) (ACUTE ONLY): 33 min  Charges:  $Gait Training: 8-22 mins $Therapeutic Exercise: 8-22 mins                        Faye Ramsay, PT Acute Rehabilitation  Office: [phone] Pager: [phone]

## 2021-03-31 NOTE — Progress Notes (Signed)
Physical Therapy Treatment Patient Details Name: ARYEL EDELEN MRN: [id number] DOB: 25-Nov-1946 Today's Date: 03/31/2021    History of Present Illness Patient is 75 y.o. female s/p R THA-DA approach on 03/27/21 with PMH significant for neuromuscular disorder, GERD, OA, remote back surgery.    PT Comments    Pt continues to progress slowly. Moderate pain with activity-burning and tightness reported in front of hip/thigh. 2 seated rest breaks needed taken 2* pt report of dizziness-BP 148/76. Pt reports she has a hx of vertigo-thus she is at risk for falls when mobilizing. Pt stated she does not have 24 hour supervision available at home. May need to consider ST SNF.Will continue to progress activity as able. If pt returns home, recommend HHPT and home health aide.     Follow Up Recommendations  Follow surgeon's recommendation for DC plan and follow-up therapies;Supervision/Assistance - 24 hour (may need to consider ST rehab for safety 2* poor progress and dizziness)     Equipment Recommendations  3n1    Recommendations for Other Services       Precautions / Restrictions Precautions Precautions: Fall Restrictions Weight Bearing Restrictions: No Other Position/Activity Restrictions: WBAT    Mobility  Bed Mobility Overal bed mobility: Needs Assistance Bed Mobility: Supine to Sit;Sit to Supine     Supine to sit: Min guard Sit to supine: Min assist   General bed mobility comments: Issued leg lifter for pt to use for bed mobility. Increased time and task remains effortful.    Transfers Overall transfer level: Needs assistance Equipment used: Rolling walker (2 wheeled) Transfers: Sit to/from Stand Sit to Stand: Min guard         General transfer comment: Min guard for safety. Cues for safety, technique.  Ambulation/Gait Ambulation/Gait assistance: Min guard Gait Distance (Feet): 35 Feet (35'x1; 15'x1, 20'x1) Assistive device: Rolling walker (2 wheeled) Gait  Pattern/deviations: Step-to pattern;Step-through pattern;Decreased stride length;Decreased step length - right;Decreased weight shift to left     General Gait Details: Cues for safety, posture, step length on R. Intermittent scooting of foot on R (2* pain) but pt IS ABLE to advance R LE. Slow gait speed. 2 seated breaks 2* c/o dizziness-BP 148/76. Followed with recliner for safety.   Stairs             Wheelchair Mobility    Modified Rankin (Stroke Patients Only)       Balance Overall balance assessment: Needs assistance         Standing balance support: Bilateral upper extremity supported Standing balance-Leahy Scale: Poor                              Cognition Arousal/Alertness: Awake/alert Behavior During Therapy: WFL for tasks assessed/performed Overall Cognitive Status: Within Functional Limits for tasks assessed                                        Exercises Total Joint Exercises Ankle Circles/Pumps: AROM;Both;10 reps Quad Sets: AROM;Both;10 reps Heel Slides: AAROM;Right;10 reps Hip ABduction/ADduction: AAROM;Right;10 reps    General Comments        Pertinent Vitals/Pain Pain Assessment: 0-10 Pain Score: 7  Pain Location: R groin, thigh Pain Descriptors / Indicators: Burning;Discomfort;Tightness Pain Intervention(s): Limited activity within patient's tolerance;Monitored during session;Ice applied;Repositioned    Home Living  Prior Function            PT Goals (current goals can now be found in the care plan section) Progress towards PT goals: Progressing toward goals    Frequency    7X/week      PT Plan Current plan remains appropriate    Co-evaluation              AM-PAC PT "6 Clicks" Mobility   Outcome Measure  Help needed turning from your back to your side while in a flat bed without using bedrails?: A Little Help needed moving from lying on your back to sitting  on the side of a flat bed without using bedrails?: A Little Help needed moving to and from a bed to a chair (including a wheelchair)?: A Little Help needed standing up from a chair using your arms (e.g., wheelchair or bedside chair)?: A Little Help needed to walk in hospital room?: A Little Help needed climbing 3-5 steps with a railing? : A Lot 6 Click Score: 17    End of Session Equipment Utilized During Treatment: Gait belt Activity Tolerance: Patient limited by fatigue;Patient limited by pain Patient left: in bed;with call bell/phone within reach;with bed alarm set   PT Visit Diagnosis: Other abnormalities of gait and mobility (R26.89);Pain Pain - Right/Left: Right Pain - part of body: Hip     Time: 1430-1500 PT Time Calculation (min) (ACUTE ONLY): 30 min  Charges:  $Gait Training: 23-37 mins $Therapeutic Exercise: 8-22 mins                         Faye Ramsay, PT Acute Rehabilitation  Office: [phone] Pager: [phone]

## 2021-03-31 NOTE — Progress Notes (Signed)
   Subjective: 4 Days Post-Op Procedure(s) (LRB): TOTAL HIP ARTHROPLASTY ANTERIOR APPROACH (Right) Patient reports pain as moderate.   Patient seen in rounds for Dr. Lequita Halt. Patient is well, and has had no acute complaints or problems other than continued pain in the hip and back. She reports the gabapentin has been helpful with her pain. She cannot take Prednisone as it causes severe hyperglycemia and hypertension. Ambulated 26 feet with PT today. She does not feel ready to go home today.  We will continue therapy today.   Objective: Vital signs in last 24 hours: Temp:  [97.9 F (36.6 C)-98.7 F (37.1 C)] 97.9 F (36.6 C) (04/24 0533) Pulse Rate:  [93-97] 97 (04/24 0533) Resp:  [16] 16 (04/24 0533) BP: (125-135)/(55-72) 135/72 (04/24 0533) SpO2:  [97 %-100 %] 97 % (04/24 0533)  Intake/Output from previous day:  Intake/Output Summary (Last 24 hours) at 03/31/2021 1004 Last data filed at 03/31/2021 0900 Gross per 24 hour  Intake 360 ml  Output 1800 ml  Net -1440 ml     Intake/Output this shift: Total I/O In: 120 [P.O.:120] Out: -   Labs: Recent Labs    03/29/21 0318 03/30/21 0246  HGB 9.6* 9.3*   Recent Labs    03/29/21 0318 03/30/21 0246  WBC 10.7* 9.1  RBC 3.00* 2.95*  HCT 28.9* 29.1*  PLT 224 233   Recent Labs    03/29/21 0318  NA 138  K 4.0  CL 105  CO2 27  BUN 14  CREATININE 0.57  GLUCOSE 162*  CALCIUM 8.9   No results for input(s): LABPT, INR in the last 72 hours.  Exam: General - Patient is Alert and Oriented Extremity - Neurologically intact Sensation intact distally Intact pulses distally Dorsiflexion/Plantar flexion intact Dressing - dressing C/D/I Motor Function - intact, moving foot and toes well on exam.   Past Medical History:  Diagnosis Date  . Arthritis    back, hips  . Diverticulitis    under control  . Eczema   . GERD (gastroesophageal reflux disease)   . Heart murmur   . History of kidney stones   . Neuromuscular  disorder (HCC)    Rt leg   . PONV (postoperative nausea and vomiting)   . Pre-diabetes     Assessment/Plan: 4 Days Post-Op Procedure(s) (LRB): TOTAL HIP ARTHROPLASTY ANTERIOR APPROACH (Right) Principal Problem:   OA (osteoarthritis) of hip Active Problems:   Primary osteoarthritis of right hip  Estimated body mass index is 35.48 kg/m as calculated from the following:   Height as of this encounter: 5\' 2"  (1.575 m).   Weight as of this encounter: 88 kg. Advance diet Up with therapy  DVT Prophylaxis - Aspirin Weight bearing as tolerated.  Plan is to go Home after hospital stay. Plan to continue working with PT today towards safe mobility. If she makes significant progress today we may consider discharge, otherwise likely discharge home tomorrow.   , PA-C Orthopedic Surgery [phone] 03/31/2021, 10:04 AM

## 2021-04-01 MED ORDER — POLYETHYLENE GLYCOL 3350 17 G PO PACK
17.0000 g | PACK | Freq: Two times a day (BID) | ORAL | Status: DC
  Administered 2021-04-01 – 2021-04-03 (×4): 17 g via ORAL
  Filled 2021-04-01 (×4): qty 1

## 2021-04-01 NOTE — TOC Progression Note (Signed)
Transition of Care [facility]) - Progression Note   Patient Details  Name: Tracey Morgan MRN: [id number] Date of Birth: 1946-01-11  Transition of Care [facility]) CM/SW [place], LCSW Phone Number: 04/01/2021, 1:03 PM  Clinical Narrative: PT evaluation recommended SNF and 3N1. CSW met with patient to discuss PT recommendations. Patient declined SNF and would prefer HHPT. Patient agreeable to referrals for HHPT and 3N1. CSW made HHPT referrals to the following agencies:  [facility]: declined [facility]: patient out of coverage area [facility]: not in-network with [facility]: declined [facility]): accepted  CSW made referral for [facility] to [facility]. CSW updated patient regarding referrals. TOC awaiting orders for HHPT and 3N1.  Expected Discharge Plan: [place] Barriers to Discharge: Continued Medical Work up  Expected Discharge Plan and Services Expected Discharge Plan: [place] In-house Referral: Clinical Social Work Post Acute Care Choice: [place] arrangements for the past 2 months: [place] Expected Discharge Date: 03/30/21               DME Arranged: 3-N-1 DME Agency: [facility] Date DME Agency Contacted: 04/01/21 Time DME Agency Contacted: 4 Representative spoke with at DME Agency: Danielle/Sheila HH Arranged: PT [employer]: [facility] [employer]) Date [place]: 04/01/21 Time [place]: 1148 Representative spoke with at [place]: Ronalee Belts  Readmission Risk Interventions No flowsheet data found.

## 2021-04-01 NOTE — Progress Notes (Signed)
Physical Therapy Treatment Patient Details Name: Tracey Morgan MRN: [id number] DOB: [birth year] Today's Date: 04/01/2021    History of Present Illness Patient is 75 y.o. female s/p R THA-DA approach on 03/27/21 with PMH significant for neuromuscular disorder, GERD, OA, remote back surgery.    PT Comments    Pt reported increased pain this afternoon since she moved awkwardly when getting into bed after using restroom earlier. She was only able to walk ~25 feet this afternoon. She was unable to attempt stair negotiation. She requested PT transport her back to the room in the recliner-she did not feel she could tolerate walking back to her room. Continue to recommend  pt consider ST SNF placement. She has not been able to meet her PT goals nor demonstrate consistent ability to safely manage at home alone (pt states she will not have 24 hour supervision). Will continue to follow and progress activity as tolerated. If pt chooses to return home, she will likely need HHPT and possibly a home health aide. She has concerns about her pain and mobility-encouraged her to discuss her concerns with the surgeon/PA.     Follow Up Recommendations  Follow surgeon's recommendation for DC plan and follow-up therapies;Supervision/Assistance - 24 hour     Equipment Recommendations       Recommendations for Other Services       Precautions / Restrictions Precautions Precautions: Fall Precaution Comments: dizziness Restrictions Weight Bearing Restrictions: No RLE Weight Bearing: Weight bearing as tolerated    Mobility  Bed Mobility Overal bed mobility: Needs Assistance Bed Mobility: Supine to Sit;Sit to Supine     Supine to sit: Min guard;HOB elevated Sit to supine: HOB elevated;Mod assist   General bed mobility comments: Issued leg lifter for pt to use for bed mobility. Increased time and task remains effortful. Pt requested assist to get both LEs onto bed this afternoon    Transfers Overall  transfer level: Needs assistance Equipment used: Rolling walker (2 wheeled) Transfers: Sit to/from Stand Sit to Stand: Min assist         General transfer comment: A to power up, steady. Increased time. Cues for safety.  Ambulation/Gait Ambulation/Gait assistance: Min guard Gait Distance (Feet): 25 Feet (10'x1) Assistive device: Rolling walker (2 wheeled) Gait Pattern/deviations: Step-to pattern;Antalgic     General Gait Details: Pt only able to walk ~25 feet this afternoon 2* pain. Followed with recliner for safety. Pt requested therapist transport her back to room in recliner.   Stairs             Wheelchair Mobility    Modified Rankin (Stroke Patients Only)       Balance Overall balance assessment: Needs assistance         Standing balance support: Bilateral upper extremity supported Standing balance-Leahy Scale: Poor                              Cognition Arousal/Alertness: Awake/alert Behavior During Therapy: WFL for tasks assessed/performed Overall Cognitive Status: Within Functional Limits for tasks assessed                                        Exercises      General Comments        Pertinent Vitals/Pain Pain Assessment: Faces Faces Pain Scale: Hurts whole lot Pain Location: R groin, thigh, leg Pain Descriptors /  Indicators: Burning;Discomfort;Tightness Pain Intervention(s): Limited activity within patient's tolerance;Monitored during session;Repositioned    Home Living                      Prior Function            PT Goals (current goals can now be found in the care plan section) Progress towards PT goals: Progressing toward goals    Frequency    7X/week      PT Plan Current plan remains appropriate    Co-evaluation              AM-PAC PT "6 Clicks" Mobility   Outcome Measure  Help needed turning from your back to your side while in a flat bed without using bedrails?: A  Little Help needed moving from lying on your back to sitting on the side of a flat bed without using bedrails?: A Little Help needed moving to and from a bed to a chair (including a wheelchair)?: A Little Help needed standing up from a chair using your arms (e.g., wheelchair or bedside chair)?: A Little Help needed to walk in hospital room?: A Little Help needed climbing 3-5 steps with a railing? : A Lot 6 Click Score: 17    End of Session Equipment Utilized During Treatment: Gait belt Activity Tolerance: Patient limited by fatigue;Patient limited by pain Patient left: in bed;with call bell/phone within reach;with family/visitor present   PT Visit Diagnosis: Other abnormalities of gait and mobility (R26.89);Pain Pain - Right/Left: Right Pain - part of body: Hip     Time: 8657-8469 PT Time Calculation (min) (ACUTE ONLY): 38 min  Charges:  $Gait Training: 38-52 mins                         Faye Ramsay, PT Acute Rehabilitation  Office: [phone] Pager: [phone]

## 2021-04-01 NOTE — Progress Notes (Signed)
   Subjective: 5 Days Post-Op Procedure(s) (LRB): TOTAL HIP ARTHROPLASTY ANTERIOR APPROACH (Right) Patient reports pain as moderate.  She has improved since Friday Plan is to go Home after hospital stay. She does not want SNF placement and will progress enough to go home with home health  Objective: Vital signs in last 24 hours: Temp:  [97.7 F (36.5 C)-97.9 F (36.6 C)] 97.9 F (36.6 C) (04/25 0507) Pulse Rate:  [83-86] 83 (04/25 0507) Resp:  [16-18] 16 (04/25 0507) BP: (126-131)/(56-70) 126/56 (04/25 0507) SpO2:  [95 %-99 %] 95 % (04/25 0507)  Intake/Output from previous day:  Intake/Output Summary (Last 24 hours) at 04/01/2021 0708 Last data filed at 03/31/2021 1800 Gross per 24 hour  Intake 360 ml  Output --  Net 360 ml    Intake/Output this shift: No intake/output data recorded.  Labs: Recent Labs    03/30/21 0246  HGB 9.3*   Recent Labs    03/30/21 0246  WBC 9.1  RBC 2.95*  HCT 29.1*  PLT 233   No results for input(s): NA, K, CL, CO2, BUN, CREATININE, GLUCOSE, CALCIUM in the last 72 hours. No results for input(s): LABPT, INR in the last 72 hours.  EXAM General - Patient is Alert, Appropriate and Oriented Extremity - Neurologically intact Neurovascular intact Compartment soft Dressing/Incision - clean, dry, no drainage Motor Function - intact, moving foot and toes well on exam.   Past Medical History:  Diagnosis Date  . Arthritis    back, hips  . Diverticulitis    under control  . Eczema   . GERD (gastroesophageal reflux disease)   . Heart murmur   . History of kidney stones   . Neuromuscular disorder (HCC)    Rt leg   . PONV (postoperative nausea and vomiting)   . Pre-diabetes     Assessment/Plan: 5 Days Post-Op Procedure(s) (LRB): TOTAL HIP ARTHROPLASTY ANTERIOR APPROACH (Right) Principal Problem:   OA (osteoarthritis) of hip Active Problems:   Primary osteoarthritis of right hip   Up with therapy   Weight Bearing As Tolerated  right Leg  Ollen Gross 04/01/2021, 7:08 AM

## 2021-04-01 NOTE — Progress Notes (Signed)
Physical Therapy Treatment Patient Details Name: Tracey Morgan MRN: [id number] DOB: 01-23-1946 Today's Date: 04/01/2021    History of Present Illness Patient is 75 y.o. female s/p R THA-DA approach on 03/27/21 with PMH significant for neuromuscular disorder, GERD, OA, remote back surgery.    PT Comments    Pt is progressing slowly. She continues to require recliner follow for safety 2* dizziness, fatigue-continue to recommend 24 hour supervision for this reason. Per chart, SNF not an option (pt prefers home). Will continue to progress activity as tolerated.    Follow Up Recommendations  Follow surgeon's recommendation for DC plan and follow-up therapies;Supervision/Assistance - 24 hour (SNF not an option per MD and pt. Continue to recommend 24/7 supervision 2* dizziness)     Equipment Recommendations  3in1 (PT)    Recommendations for Other Services       Precautions / Restrictions Precautions Precautions: Fall Precaution Comments: dizziness Restrictions Weight Bearing Restrictions: No RLE Weight Bearing: Weight bearing as tolerated    Mobility  Bed Mobility Overal bed mobility: Needs Assistance Bed Mobility: Supine to Sit     Supine to sit: Min guard;HOB elevated     General bed mobility comments: Issued leg lifter for pt to use for bed mobility. Increased time and task remains effortful.    Transfers Overall transfer level: Needs assistance Equipment used: Rolling walker (2 wheeled) Transfers: Sit to/from Stand Sit to Stand: Min guard         General transfer comment: Min guard for safety.  Ambulation/Gait Ambulation/Gait assistance: Min guard Gait Distance (Feet): 50 Feet (50'x1, 15'x1) Assistive device: Rolling walker (2 wheeled) Gait Pattern/deviations: Step-to pattern;Step-through pattern;Decreased stride length;Decreased step length - right;Decreased weight shift to left     General Gait Details: Cues for safety, posture, step length on R. Slow gait  speed. 1 seated break 2* c/o dizzinesss-BP has been fine each time it has been checked. Requires follow with recliner for safety 2* dizziness and fatigue.   Stairs             Wheelchair Mobility    Modified Rankin (Stroke Patients Only)       Balance Overall balance assessment: Needs assistance         Standing balance support: Bilateral upper extremity supported Standing balance-Leahy Scale: Poor                              Cognition Arousal/Alertness: Awake/alert Behavior During Therapy: WFL for tasks assessed/performed Overall Cognitive Status: Within Functional Limits for tasks assessed                                        Exercises Total Joint Exercises Ankle Circles/Pumps: AROM;Both;15 reps Quad Sets: AROM;Both;15 reps Heel Slides: AAROM;Right;15 reps (pt used leg lifter to assist) Hip ABduction/ADduction: AAROM;15 reps (pt used leg lifter to assist)    General Comments        Pertinent Vitals/Pain Pain Assessment: 0-10 Pain Score: 7  Pain Location: R groin, thigh Pain Descriptors / Indicators: Burning;Discomfort;Tightness Pain Intervention(s): Limited activity within patient's tolerance;Monitored during session;Repositioned;Ice applied    Home Living                      Prior Function            PT Goals (current goals can now be found  in the care plan section) Progress towards PT goals: Progressing toward goals    Frequency    7X/week      PT Plan Current plan remains appropriate    Co-evaluation              AM-PAC PT "6 Clicks" Mobility   Outcome Measure  Help needed turning from your back to your side while in a flat bed without using bedrails?: A Little Help needed moving from lying on your back to sitting on the side of a flat bed without using bedrails?: A Little Help needed moving to and from a bed to a chair (including a wheelchair)?: A Little Help needed standing up from a  chair using your arms (e.g., wheelchair or bedside chair)?: A Little Help needed to walk in hospital room?: A Little Help needed climbing 3-5 steps with a railing? : A Little 6 Click Score: 18    End of Session Equipment Utilized During Treatment: Gait belt Activity Tolerance: Patient limited by fatigue;Patient limited by pain Patient left: in chair;with call bell/phone within reach;with chair alarm set   PT Visit Diagnosis: Other abnormalities of gait and mobility (R26.89);Pain Pain - Right/Left: Right Pain - part of body: Hip     Time: 1019-1050 PT Time Calculation (min) (ACUTE ONLY): 31 min  Charges:  $Gait Training: 8-22 mins $Therapeutic Exercise: 8-22 mins                         Faye Ramsay, PT Acute Rehabilitation  Office: [phone] Pager: [phone]

## 2021-04-02 ENCOUNTER — Encounter (HOSPITAL_COMMUNITY): Payer: Self-pay | Admitting: Orthopedic Surgery

## 2021-04-02 MED ORDER — DIAZEPAM 5 MG PO TABS
5.0000 mg | ORAL_TABLET | Freq: Four times a day (QID) | ORAL | Status: DC | PRN
  Administered 2021-04-02 – 2021-04-04 (×5): 5 mg via ORAL
  Filled 2021-04-02 (×5): qty 1

## 2021-04-02 MED ORDER — DIAZEPAM 5 MG PO TABS: 5.0000 mg | ORAL_TABLET | Freq: Four times a day (QID) | ORAL | 0 refills | Status: DC | PRN

## 2021-04-02 NOTE — Progress Notes (Signed)
Physical Therapy Treatment Patient Details Name: Tracey Morgan MRN: [id number] DOB: [birth year] Today's Date: 04/02/2021    History of Present Illness Patient is 75 y.o. female s/p R THA-DA approach on 03/27/21 with PMH significant for neuromuscular disorder, GERD, OA, remote back surgery.    PT Comments    POD # 6 Pt c/o she has not had a BM in several days.  Assisted pt off BSC to amb in hallway.  General transfer comment: requires increased time and MAX positive reinforcement.  Assisted off BSC. Able to self perform peri care standing with heavy lean on walker.General Gait Details: Max encouragement to amb in hallway despite pain level as pt describes "shooting pain from my back"  "It's not the hip, it's the back".  Follow Up Recommendations  Follow surgeon's recommendation for DC plan and follow-up therapies;Supervision/Assistance - 24 hour     Equipment Recommendations  3in1 (PT)    Recommendations for Other Services       Precautions / Restrictions Precautions Precautions: Fall Restrictions Weight Bearing Restrictions: No RLE Weight Bearing: Weight bearing as tolerated    Mobility  Bed Mobility Overal bed mobility: Needs Assistance Bed Mobility: Supine to Sit     Supine to sit: Min guard;HOB elevated     General bed mobility comments: OOB in recliner    Transfers Overall transfer level: Needs assistance Equipment used: Rolling walker (2 wheeled) Transfers: Sit to/from [employer] Sit to Stand: Supervision Stand pivot transfers: Supervision       General transfer comment: requires increased time and MAX positive reinforcement.  Assisted off BSC. Able to self perform peri care standing with heavy lean on walker.  Ambulation/Gait Ambulation/Gait assistance: Supervision Gait Distance (Feet): 48 Feet Assistive device: Rolling walker (2 wheeled) Gait Pattern/deviations: Step-to pattern;Antalgic Gait velocity: decr   General Gait Details: Max  encouragement to amb in hallway despite pain level as pt describes "shooting pain from my back"  "It's not the hip, it's the back".   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        Pertinent Vitals/Pain Pain Assessment: 0-10 Pain Score: 9  Pain Location: starts at low back and "shoots" down thru R buttock all way to R knee Pain Descriptors / Indicators: Burning;Discomfort;Sharp Pain Intervention(s): Monitored during session;Repositioned;Ice applied;Heat applied    Home Living                      Prior Function            PT Goals (current goals can now be found in the care plan section) Progress towards PT goals: Progressing toward goals    Frequency    7X/week  PT Plan Current plan remains appropriate    Co-evaluation              AM-PAC PT "6 Clicks" Mobility   Outcome Measure  Help needed turning from your back to your side while in a flat bed without using bedrails?: A Little Help needed moving from lying on your back to sitting on the side of a flat bed without using bedrails?: A Little Help needed moving to and from a bed to a chair (including a wheelchair)?: A Little Help needed standing up from a chair using your arms (e.g., wheelchair or bedside chair)?: A Little Help needed to walk in hospital room?: A Little Help needed climbing 3-5 steps with a railing? : A Lot 6 Click Score: 17    End of Session Equipment Utilized During Treatment: Gait belt Activity Tolerance: No increased pain Patient  left: in chair;with call bell/phone within reach Nurse Communication: Mobility status PT Visit Diagnosis: Other abnormalities of gait and mobility (R26.89);Pain Pain - Right/Left: Right Pain - part of body: Hip     Time: 7902-4097 PT Time Calculation (min) (ACUTE ONLY): 18 min  Charges:  $Gait Training: 8-22 mins $Therapeutic Activity: 8-22 mins                     Felecia Shelling  PTA Acute  Rehabilitation Services Pager      [phone] Office      [phone]

## 2021-04-02 NOTE — Progress Notes (Addendum)
PHYSICAL THERAPY note to Dr Lequita Halt  Pt c/o 10/10 sharp pain that starts at L3,L4,L5 area and "shoots" down R buttock "all the way to my knee" whenever "I move".   Pt is becoming slightly irritated that "no one believes me" and now she wants to "talk to a Child psychotherapist" about ST Rehab.  "I can't go home like this"   Pt is progressing slowly and can move but is quick to say "I can't" and tends to perform with minimal effort.  Will continue to follow and increase  her mobility but has yet to practice stairs.  "Oh no!! There's no way I can do stairs".  Felecia Shelling  PTA Acute  Rehabilitation Services Pager      [phone] Office      [phone]

## 2021-04-02 NOTE — TOC Progression Note (Signed)
Transition of Care [facility]) - Progression Note   Patient Details  Name: Tracey Morgan MRN: [id number] Date of Birth: 07/12/1946  [facility] CM/SW Contact  Ewing Schlein, LCSW Phone Number: 04/02/2021, 2:50 PM  Clinical Narrative: CSW notified patient now wanted SNF/rehab. FL2 completed and patient faxed out. Patient has only received her first COVID vaccine. CSW spoke with patient regarding discharging to SNF rather than home with HHPT. Per patient, she is not agreeing to to go to SNF or home with HHPT and is requesting inpatient rehab. CSW explained this was not recommended by PT or the orthopedist. Patient requesting to speak with orthopedist regarding IR. CSW updated ortho PA. TOC to follow.  Expected Discharge Plan: Home w Home Health Services Barriers to Discharge: Continued Medical Work up  Expected Discharge Plan and Services Expected Discharge Plan: Home w Home Health Services In-house Referral: Clinical Social Work Post Acute Care Choice: Durable Medical Equipment,Home Health Living arrangements for the past 2 months: Single Family Home Expected Discharge Date: 03/30/21               DME Arranged: 3-N-1 DME Agency: [employer] Date DME Agency Contacted: 04/01/21 Time DME Agency Contacted: 46 Representative spoke with at DME Agency: Danielle/Sheila HH Arranged: PT HH Agency: [facility] Date HH Agency Contacted: 04/01/21 Time HH Agency Contacted: [phone] Representative spoke with at [facility] Agency: Kathlene November  Readmission Risk Interventions No flowsheet data found.

## 2021-04-02 NOTE — Care Management Important Message (Signed)
Important Message  Patient Details IM Letter given to the Patient. Name: Tracey Morgan MRN: [id number] Date of Birth: 09-Jun-1946   Medicare Important Message Given:  Yes     [name] 04/02/2021, 9:32 AM

## 2021-04-02 NOTE — Progress Notes (Signed)
Physical Therapy Treatment Patient Details Name: Tracey Morgan MRN: [id number] DOB: [birth year] Today's Date:   History of Present Illness Patient is 75 y.o. female s/p R THA-DA approach on 03/27/21 with PMH significant for neuromuscular disorder, GERD, OA, remote back surgery.    PT Comments    POD # 6 pm session Pt consistently c/o "back" pain which she believes to be the cause for her lack of progress.  General transfer comment: self able to rise but with increased effort and c/o "back" pain the "shoots" down R hip General Gait Details: assisted with amb in hallway again this afternoon with much pain which pt believes to be coming from her back.General stair comments: attempted however pt was unable to tolerate full WBing R LE to step up with her "good' L LE.   Follow Up Recommendations  Follow surgeon's recommendation for DC plan and follow-up therapies;Supervision/Assistance - 24 hour     Equipment Recommendations  3in1 (PT)    Recommendations for Other Services       Precautions / Restrictions Precautions Precautions: Fall Restrictions Weight Bearing Restrictions: No RLE Weight Bearing: Weight bearing as tolerated    Mobility  Bed Mobility Overal bed mobility: Needs Assistance Bed Mobility: Supine to Sit     Supine to sit: Min guard;HOB elevated     General bed mobility comments: OOB in recliner    Transfers Overall transfer level: Needs assistance Equipment used: Rolling walker (2 wheeled) Transfers: Sit to/from [employer] Sit to Stand: Supervision Stand pivot transfers: Supervision       General transfer comment: self able to rise but with increased effort and c/o "back" pain the "shoots" down R hip  Ambulation/Gait Ambulation/Gait assistance: Supervision Gait Distance (Feet): 32 Feet Assistive device: Rolling walker (2 wheeled) Gait Pattern/deviations: Step-to pattern;Antalgic Gait velocity: decr   General Gait Details: assisted  with amb in hallway again this afternoon with much pain which pt believes to be coming from her back.   Stairs Stairs: Yes       General stair comments: attempted however pt was unable to tolerate full WBing R LE to step up with her "good' L LE.   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        Pertinent Vitals/Pain Pain Assessment: 0-10 Pain Score: 9  Pain Location: starts at low back and "shoots" down thru R buttock all way to R knee Pain Descriptors / Indicators: Burning;Discomfort;Sharp Pain Intervention(s): Monitored during session;Repositioned;Ice applied;Heat applied    Home Living                      Prior Function            PT Goals (current goals can now be found in the  care plan section) Progress towards PT goals: Progressing toward goals    Frequency    7X/week      PT Plan Current plan remains appropriate    Co-evaluation              AM-PAC PT "6 Clicks" Mobility   Outcome Measure  Help needed turning from your back to your side while in a flat bed without using bedrails?: A Little Help needed moving from lying on your back to sitting on the side of a flat bed without using bedrails?: A Little Help needed moving to and from a bed to a chair (including a wheelchair)?: A Little Help needed standing up from a chair using your arms (e.g., wheelchair or bedside chair)?: A Little Help needed to walk in hospital room?: A Little Help needed climbing 3-5 steps with a railing? : A Lot 6 Click Score: 17    End of Session  Equipment Utilized During Treatment: Gait belt Activity Tolerance: No increased pain Patient left: in chair;with call bell/phone within reach Nurse Communication: Mobility status PT Visit Diagnosis: Other abnormalities of gait and mobility (R26.89);Pain Pain - Right/Left: Right Pain - part of body: Hip     Time: 1420-1450 PT Time Calculation (min) (ACUTE ONLY): 30 min  Charges:  $Gait Training: 8-22 mins $Therapeutic Activity: 8-22 mins                     Felecia Shelling  PTA Acute  Rehabilitation Services Pager      [phone] Office      [phone]

## 2021-04-02 NOTE — Progress Notes (Signed)
Physical Therapy Treatment Patient Details Name: OLLA DELANCEY MRN: [id number] DOB: Jan 16, 1946 Today's Date: 04/02/2021    History of Present Illness Patient is 75 y.o. female s/p R THA-DA approach on 03/27/21 with PMH significant for neuromuscular disorder, GERD, OA, remote back surgery.    PT Comments    POD #6 am session Assisted OOB to [facility] this session required increased time and effort esp getting OOB.    Follow Up Recommendations  Follow surgeon's recommendation for DC plan and follow-up therapies;Supervision/Assistance - 24 hour     Equipment Recommendations  3in1 (PT)    Recommendations for Other Services       Precautions / Restrictions Precautions Precautions: Fall Restrictions Weight Bearing Restrictions: No RLE Weight Bearing: Weight bearing as tolerated    Mobility  Bed Mobility Overal bed mobility: Needs Assistance Bed Mobility: Supine to Sit     Supine to sit: Min guard;HOB elevated     General bed mobility comments: increased time using belt to self asssit getting OOB on her LEFT side.  Nearly 6 min to complete.    Transfers Overall transfer level: Needs assistance Equipment used: Rolling walker (2 wheeled)   Sit to Stand: Supervision Stand pivot transfers: Supervision       General transfer comment: requires increased time and MAX positive reinforcement.  Assisted on [facility].  Ambulation/Gait             General Gait Details: transfer to [facility] only this session.   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        Pertinent Vitals/Pain Pain Assessment: 0-10 Pain  Score: 8  Pain Location: starts at low back and "shoots" down thru R buttock all way to R knee Pain Descriptors / Indicators: Burning;Discomfort;Sharp Pain Intervention(s): Monitored during session;Repositioned;Ice applied;Heat applied    Home Living                      Prior Function            PT Goals (current goals can now be found in the care plan section) Progress towards PT goals: Progressing toward goals    Frequency    7X/week      PT Plan Current plan remains appropriate    Co-evaluation              AM-PAC PT "6 Clicks" Mobility   Outcome Measure  Help needed turning from your back to your side while in a flat bed without using bedrails?: A Little Help needed moving from lying on your back to sitting on the side of a flat bed without using bedrails?: A Little Help needed moving to and from a bed to a chair (including a wheelchair)?: A  Little Help needed standing up from a chair using your arms (e.g., wheelchair or bedside chair)?: A Little Help needed to walk in hospital room?: A Little Help needed climbing 3-5 steps with a railing? : A Lot 6 Click Score: 17    End of Session Equipment Utilized During Treatment: Gait belt Activity Tolerance: Patient limited by fatigue;Patient limited by pain Patient left: Other (comment) [facility]) Nurse Communication: Mobility status PT Visit Diagnosis: Other abnormalities of gait and mobility (R26.89);Pain Pain - Right/Left: Right Pain - part of body: Hip     Time: 4970-2637 PT Time Calculation (min) (ACUTE ONLY): 10 min  Charges:  $Therapeutic Activity: 8-22 mins                     Felecia Shelling  PTA Acute  Rehabilitation Services Pager      [phone] Office      [phone]

## 2021-04-02 NOTE — Progress Notes (Signed)
   Subjective: 6 Days Post-Op Procedure(s) (LRB): TOTAL HIP ARTHROPLASTY ANTERIOR APPROACH (Right) Patient reports pain as mild.   Patient seen in rounds by Dr. Lequita Halt. Patient is well, and has had no acute complaints or problems Plan is to go Home after hospital stay.  Objective: Vital signs in last 24 hours: Temp:  [98.2 F (36.8 C)] 98.2 F (36.8 C) (04/26 0533) Pulse Rate:  [85-91] 85 (04/26 0533) Resp:  [16-17] 16 (04/26 0533) BP: (131-133)/(68-77) 133/77 (04/26 0533) SpO2:  [99 %-100 %] 99 % (04/26 0533)  Intake/Output from previous day:  Intake/Output Summary (Last 24 hours) at 04/02/2021 0748 Last data filed at 04/02/2021 0600 Gross per 24 hour  Intake 900 ml  Output 550 ml  Net 350 ml    Intake/Output this shift: No intake/output data recorded.  Labs: No results for input(s): HGB in the last 72 hours. No results for input(s): WBC, RBC, HCT, PLT in the last 72 hours. No results for input(s): NA, K, CL, CO2, BUN, CREATININE, GLUCOSE, CALCIUM in the last 72 hours. No results for input(s): LABPT, INR in the last 72 hours.  Exam: General - Patient is Alert and Oriented Extremity - Neurologically intact Neurovascular intact Sensation intact distally Dorsiflexion/Plantar flexion intact Dressing/Incision - Aquacel in place Motor Function - intact, moving foot and toes well on exam.   Past Medical History:  Diagnosis Date  . Arthritis    back, hips  . Diverticulitis    under control  . Eczema   . GERD (gastroesophageal reflux disease)   . Heart murmur   . History of kidney stones   . Neuromuscular disorder (HCC)    Rt leg   . PONV (postoperative nausea and vomiting)   . Pre-diabetes     Assessment/Plan: 6 Days Post-Op Procedure(s) (LRB): TOTAL HIP ARTHROPLASTY ANTERIOR APPROACH (Right) Principal Problem:   OA (osteoarthritis) of hip Active Problems:   Primary osteoarthritis of right hip  Estimated body mass index is 35.48 kg/m as calculated from  the following:   Height as of this encounter: 5\' 2"  (1.575 m).   Weight as of this encounter: 88 kg. Up with therapy  Weight-bearing as tolerated Discharge later today if cleared by PT with HHPT  , PA-C Orthopedic Surgery [phone] 04/02/2021, 7:48 AM

## 2021-04-02 NOTE — [place] FL2 (Signed)
[place] MEDICAID FL2 LEVEL OF CARE SCREENING TOOL     IDENTIFICATION  Patient Name: Tracey Morgan Birthdate: 01-27-1946 Sex: female Admission Date (Current Location): 03/27/2021  [facility] and [place] Number:  Producer, television/film/video and Address:  [facility],  [address], [place] [address]      Provider Number: [id number]  Attending Physician Name and Address:  Ollen Gross, MD  Relative Name and Phone Number:  Ola Spurr (daughter) Ph: [phone]    Current Level of Care: Hospital Recommended Level of Care: Skilled Nursing Facility Prior Approval Number:    Date Approved/Denied:   PASRR Number: [id number]  Discharge Plan: SNF    Current Diagnoses: Patient Active Problem List   Diagnosis Date Noted  . OA (osteoarthritis) of hip 03/27/2021  . Primary osteoarthritis of right hip 03/27/2021  . 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    Orientation RESPIRATION BLADDER Height & Weight     Self,Time,Situation,Place  Normal Continent Weight: 194 lb (88 kg) Height:  5\' 2"  (157.5 cm)  BEHAVIORAL SYMPTOMS/MOOD NEUROLOGICAL BOWEL NUTRITION STATUS      Continent Diet (Regular diet)  AMBULATORY STATUS COMMUNICATION OF NEEDS Skin   Extensive Assist Verbally Surgical wounds                       Personal Care Assistance Level of Assistance  Bathing,Feeding,Dressing Bathing Assistance: Limited assistance Feeding assistance: Independent Dressing Assistance: Limited assistance     Functional Limitations Info  Sight,Hearing,Speech Sight Info: Adequate Hearing Info: Adequate Speech Info: Adequate    SPECIAL CARE FACTORS FREQUENCY  PT (By licensed PT),OT (By licensed OT)     PT Frequency: 5x's/week OT Frequency: 5x's/week            Contractures Contractures Info: Not present    Additional Factors Info  Allergies,Code Status,Psychotropic Code Status Info: Full Allergies Info: Hydralazine, Metformin, Metronidazole,  Percocet (Oxycodone-acetaminophen), Buprenorphine Hcl, Codeine, Hydrocodone, Morphine And Related Psychotropic Info: Neurontin (gabapentin), Valium (diazepam)         Current Medications (04/02/2021):  This is the current hospital active medication list Current Facility-Administered Medications  Medication Dose Route Frequency Provider Last Rate Last Admin  . 0.9 %  sodium chloride infusion   Intravenous Continuous Edmisten, Kristie L, PA 75 mL/hr at 03/28/21 0356 Infusion Verify at 03/28/21 0356  . alum & mag hydroxide-simeth (MAALOX/MYLANTA) 200-200-20 MG/5ML suspension 30 mL  30 mL Oral Q6H PRN Edmisten, Kristie L, PA   30 mL at 04/01/21 1408  . aspirin EC tablet 325 mg  325 mg Oral BID Edmisten, Kristie L, PA   325 mg at 04/02/21 0834  . bisacodyl (DULCOLAX) suppository 10 mg  10 mg Rectal Daily PRN Edmisten, Kristie L, PA      . diazepam (VALIUM) tablet 5 mg  5 mg Oral Q6H PRN Aluisio, 04/04/21, MD      . diphenhydrAMINE (BENADRYL) tablet 25 mg  25 mg Oral QHS PRN Edmisten, Kristie L, PA   25 mg at 03/30/21 0810  . docusate sodium (COLACE) capsule 100 mg  100 mg Oral BID Edmisten, Kristie L, PA   100 mg at 04/02/21 0834  . gabapentin (NEURONTIN) capsule 100 mg  100 mg Oral BID 04/04/21, MD   100 mg at 04/02/21 0834  . HYDROmorphone (DILAUDID) injection 0.5-1 mg  0.5-1 mg Intravenous Q2H PRN Edmisten, Kristie L, PA   1 mg at 03/28/21 0650  . HYDROmorphone (DILAUDID) tablet 2-4 mg  2-4 mg  Oral Q4H PRN Edmisten, Kristie L, PA   4 mg at 04/02/21 0834  . magnesium citrate solution 1 Bottle  1 Bottle Oral Once PRN Edmisten, Kristie L, PA      . menthol-cetylpyridinium (CEPACOL) lozenge 3 mg  1 lozenge Oral PRN Edmisten, Kristie L, PA   3 mg at 03/28/21 0212   Or  . phenol (CHLORASEPTIC) mouth spray 1 spray  1 spray Mouth/Throat PRN Edmisten, Kristie L, PA      . metoCLOPramide (REGLAN) tablet 5-10 mg  5-10 mg Oral Q8H PRN Edmisten, Kristie L, PA       Or  . metoCLOPramide (REGLAN) injection  5-10 mg  5-10 mg Intravenous Q8H PRN Edmisten, Kristie L, PA   10 mg at 03/28/21 0738  . ondansetron (ZOFRAN) tablet 4 mg  4 mg Oral Q6H PRN Edmisten, Kristie L, PA   4 mg at 03/28/21 2355   Or  . ondansetron (ZOFRAN) injection 4 mg  4 mg Intravenous Q6H PRN Edmisten, Kristie L, PA   4 mg at 03/28/21 1155  . polyethylene glycol (MIRALAX / GLYCOLAX) packet 17 g  17 g Oral BID Ollen Gross, MD   17 g at 04/02/21 4784     Discharge Medications: Please see discharge summary for a list of discharge medications.  Relevant Imaging Results:  Relevant Lab Results:   Additional Information SSN: [id number]  Ewing Schlein, LCSW

## 2021-04-03 ENCOUNTER — Inpatient Hospital Stay (HOSPITAL_COMMUNITY): Payer: PPO

## 2021-04-03 NOTE — Progress Notes (Incomplete)
Physical Therapy Treatment Patient Details Name: Tracey Morgan MRN: [id number] DOB: [birth year] Today's Date: 04/03/2021    History of Present Illness Patient is 75 y.o. female s/p R THA-DA approach on 03/27/21 with PMH significant for neuromuscular disorder, GERD, OA, remote back surgery.    PT Comments    POD # 7  Assisted OOB to  Follow Up Recommendations  Follow surgeon's recommendation for DC plan and follow-up therapies;Supervision/Assistance - 24 hour;SNF (pt requesting SNF)     Equipment Recommendations       Recommendations for Other Services       Precautions / Restrictions Precautions Precautions: Fall Restrictions Weight Bearing Restrictions: No Other Position/Activity Restrictions: WBAT    Mobility  Bed Mobility Overal bed mobility: Needs Assistance Bed Mobility: Supine to Sit;Sit to Supine     Supine to sit: Min guard;HOB elevated Sit to supine: HOB elevated;Mod assist   General bed mobility comments: assisted OOB and back to bed after using BSC    Transfers Overall transfer level: Needs assistance Equipment used: Rolling walker (2 wheeled) Transfers: Sit to/from [employer] Sit to Stand: Supervision Stand pivot transfers: Supervision       General transfer comment: self able to rise but with increased effort and c/o "back" pain the "shoots" down R hip  Ambulation/Gait             General Gait Details: transfers only this session due to pain and c/o not being able to have a BM   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        Pertinent  Vitals/Pain Pain Assessment: 0-10 Pain Score: 8  Pain Location: starts at low back and "shoots" down thru R buttock all way to R knee Pain Descriptors / Indicators: Burning;Discomfort;Sharp Pain Intervention(s): Monitored during session    Home Living                      Prior Function            PT Goals (current goals can now be found in the care plan section) Progress towards PT goals: Progressing toward goals    Frequency    7X/week      PT Plan Current plan remains appropriate    Co-evaluation              AM-PAC PT "6 Clicks" Mobility   Outcome Measure  Help needed turning from your back to your side while in a flat bed without using bedrails?: A Little Help needed moving from lying on your back to sitting on the side of a flat bed without using bedrails?: A Little Help needed moving to and from a bed to a chair (including a wheelchair)?:  A Little Help needed standing up from a chair using your arms (e.g., wheelchair or bedside chair)?: A Little Help needed to walk in hospital room?: A Little Help needed climbing 3-5 steps with a railing? : A Lot 6 Click Score: 17    End of Session Equipment Utilized During Treatment: Gait belt Activity Tolerance: Other (comment) Patient left: in bed;with call bell/phone within reach Nurse Communication: Mobility status PT Visit Diagnosis: Other abnormalities of gait and mobility (R26.89);Pain Pain - Right/Left: Right Pain - part of body: Hip     Time: 1035-1100 PT Time Calculation (min) (ACUTE ONLY): 25 min  Charges:  $Gait Training: 8-22 mins $Therapeutic Activity: 8-22 mins                     {enter signature dotphrase here}   Armando Reichert 04/03/2021, 1:59 PM

## 2021-04-03 NOTE — Progress Notes (Signed)
Subjective: 7 Days Post-Op Procedure(s) (LRB): TOTAL HIP ARTHROPLASTY ANTERIOR APPROACH (Right) Patient reports pain as moderate.   She feels as though the pain is coming from her low back and requests an MRI as the back is limiting her progress with the hip  Objective: Vital signs in last 24 hours: Temp:  [97.7 F (36.5 C)-98.2 F (36.8 C)] 97.7 F (36.5 C) (04/27 0530) Pulse Rate:  [88-95] 95 (04/27 0530) Resp:  [17-18] 17 (04/27 0530) BP: (137-153)/(62-83) 153/72 (04/27 0530) SpO2:  [99 %-100 %] 99 % (04/27 0530)  Intake/Output from previous day: 04/26 0701 - 04/27 0700 In: 840 [P.O.:840] Out: 200 [Urine:200] Intake/Output this shift: Total I/O In: 480 [P.O.:480] Out: -   No results for input(s): HGB in the last 72 hours. No results for input(s): WBC, RBC, HCT, PLT in the last 72 hours. No results for input(s): NA, K, CL, CO2, BUN, CREATININE, GLUCOSE, CALCIUM in the last 72 hours. No results for input(s): LABPT, INR in the last 72 hours.  Dorsiflexion/Plantar flexion intact Incision: dressing C/D/I No cellulitis present Compartment soft   Assessment/Plan: 7 Days Post-Op Procedure(s) (LRB): TOTAL HIP ARTHROPLASTY ANTERIOR APPROACH (Right) Up with therapy   Lumbar MRI given new onset of radicular symptoms in setting of previous back surgery.The pain radiating from her back has been limiting her progress with the hip. She feels it is coming from the back and not the hip.   She has contacted [facility] about possibly going there. I told her that currently she would not be a good candidate for the intensity of an inpatient rehab program so we will continue with the plan for a SNF after the lumbar work up.   Homero Fellers Camil Hausmann 04/03/2021, 6:51 AM

## 2021-04-04 DIAGNOSIS — M545 Low back pain, unspecified: Secondary | ICD-10-CM | POA: Diagnosis not present

## 2021-04-04 DIAGNOSIS — M25551 Pain in right hip: Secondary | ICD-10-CM | POA: Diagnosis not present

## 2021-04-04 DIAGNOSIS — I10 Essential (primary) hypertension: Secondary | ICD-10-CM | POA: Diagnosis not present

## 2021-04-04 DIAGNOSIS — J3089 Other allergic rhinitis: Secondary | ICD-10-CM | POA: Diagnosis not present

## 2021-04-04 DIAGNOSIS — G8918 Other acute postprocedural pain: Secondary | ICD-10-CM | POA: Diagnosis not present

## 2021-04-04 DIAGNOSIS — M2550 Pain in unspecified joint: Secondary | ICD-10-CM | POA: Diagnosis not present

## 2021-04-04 DIAGNOSIS — Z7401 Bed confinement status: Secondary | ICD-10-CM | POA: Diagnosis not present

## 2021-04-04 DIAGNOSIS — M62838 Other muscle spasm: Secondary | ICD-10-CM | POA: Diagnosis not present

## 2021-04-04 DIAGNOSIS — Z743 Need for continuous supervision: Secondary | ICD-10-CM | POA: Diagnosis not present

## 2021-04-04 DIAGNOSIS — Z96641 Presence of right artificial hip joint: Secondary | ICD-10-CM | POA: Diagnosis not present

## 2021-04-04 DIAGNOSIS — I469 Cardiac arrest, cause unspecified: Secondary | ICD-10-CM | POA: Diagnosis not present

## 2021-04-04 DIAGNOSIS — M549 Dorsalgia, unspecified: Secondary | ICD-10-CM | POA: Diagnosis not present

## 2021-04-04 DIAGNOSIS — Z471 Aftercare following joint replacement surgery: Secondary | ICD-10-CM | POA: Diagnosis not present

## 2021-04-04 DIAGNOSIS — M1611 Unilateral primary osteoarthritis, right hip: Secondary | ICD-10-CM | POA: Diagnosis not present

## 2021-04-04 DIAGNOSIS — Z7409 Other reduced mobility: Secondary | ICD-10-CM | POA: Diagnosis not present

## 2021-04-04 DIAGNOSIS — Z789 Other specified health status: Secondary | ICD-10-CM | POA: Diagnosis not present

## 2021-04-04 DIAGNOSIS — G8929 Other chronic pain: Secondary | ICD-10-CM | POA: Diagnosis not present

## 2021-04-04 LAB — RESP PANEL BY RT-PCR (FLU A&B, COVID) ARPGX2
Influenza A by PCR: NEGATIVE
Influenza B by PCR: NEGATIVE
SARS Coronavirus 2 by RT PCR: NEGATIVE

## 2021-04-04 MED ORDER — ASPIRIN 325 MG PO TBEC: 325.0000 mg | DELAYED_RELEASE_TABLET | Freq: Two times a day (BID) | ORAL | 0 refills | Status: DC

## 2021-04-04 MED ORDER — GABAPENTIN 100 MG PO CAPS: 100.0000 mg | ORAL_CAPSULE | Freq: Two times a day (BID) | ORAL | 1 refills | Status: DC

## 2021-04-04 MED ORDER — HYDROMORPHONE HCL 2 MG PO TABS: 2.0000 mg | ORAL_TABLET | Freq: Four times a day (QID) | ORAL | 0 refills | Status: DC | PRN

## 2021-04-04 MED ORDER — TRAMADOL HCL 50 MG PO TABS: 50.0000 mg | ORAL_TABLET | Freq: Four times a day (QID) | ORAL | 0 refills | Status: DC | PRN

## 2021-04-04 NOTE — TOC Transition Note (Signed)
Transition of Care [facility]) - CM/SW Discharge Note  Patient Details  Name: Tracey Morgan MRN: [id number] Date of Birth: 10/08/1946  Transition of Care [facility]) CM/SW Contact:  Ewing Schlein, LCSW Phone Number: 04/04/2021, 4:01 PM  Clinical Narrative: Patient medically stable for discharge to SNF. Patient requesting [name]. CSW spoke with Whitney at [facility] and a bed was offered. CSW started insurance authorization with Junious Dresser at [employer]. Discharge summary, discharge orders, SNF transfer report, and PT notes faxed to facility in hub.  Patient will go to room 7010A and the number for report is [phone]. COVID test was negative. HTA provided authorization for SNF [phone]) and ambulance transport ([id number]). Medical necessity form done; PTAR scheduled. Discharge packet completed. RN and patient updated. TOC signing off.  Final next level of care: Skilled Nursing Facility Barriers to Discharge: Barriers Resolved  Patient Goals and CMS Choice Patient states their goals for this hospitalization and ongoing recovery are:: Discharge to [facility] for rehab CMS Medicare.gov Compare Post Acute Care list provided to:: Patient Choice offered to / list presented to : Patient  Discharge Placement PASRR number recieved: 04/03/21         Patient chooses bed at: [name] Patient to be transferred to facility by: PTAR Patient and family notified of of transfer: 04/04/21  Discharge Plan and Services In-house Referral: Clinical Social Work Post Acute Care Choice: Durable Medical Equipment,Home Health          DME Arranged: 3-N-1 DME Agency: [employer] Date DME Agency Contacted: 04/01/21 Time DME Agency Contacted: [phone] Representative spoke with at DME Agency: Danielle/Sheila HH Arranged: PT HH Agency: [facility] Date HH Agency Contacted: 04/01/21 Time HH Agency Contacted: [phone] Representative spoke with at [facility] Agency: Kathlene November  Readmission Risk  Interventions No flowsheet data found.

## 2021-04-04 NOTE — Progress Notes (Signed)
Patient discharged to [facility] via [facility]. All belongings w/ patient. BSC left to be credited back as PTAR unable to transport. Report called.

## 2021-04-04 NOTE — Progress Notes (Signed)
   Subjective: 8 Days Post-Op Procedure(s) (LRB): TOTAL HIP ARTHROPLASTY ANTERIOR APPROACH (Right) Patient reports pain as mild.   Patient seen in rounds by Dr. Lequita Halt. Patient is well, and has had no acute complaints or problems. Denies SOB, chest pain, or calf pain. No acute overnight events.  We will continue therapy today. Ambulated 48 feet yesterday with PT.   Objective: Vital signs in last 24 hours: Temp:  [97.5 F (36.4 C)-98.1 F (36.7 C)] 98.1 F (36.7 C) (04/28 0548) Pulse Rate:  [87-90] 87 (04/28 0548) Resp:  [16-18] 16 (04/28 0548) BP: (138-140)/(77-89) 140/77 (04/28 0548) SpO2:  [100 %] 100 % (04/28 0548)  Intake/Output from previous day: No intake or output data in the 24 hours ending 04/04/21 0816   Intake/Output this shift: No intake/output data recorded.  Labs: No results for input(s): HGB in the last 72 hours. No results for input(s): WBC, RBC, HCT, PLT in the last 72 hours. No results for input(s): NA, K, CL, CO2, BUN, CREATININE, GLUCOSE, CALCIUM in the last 72 hours. No results for input(s): LABPT, INR in the last 72 hours.  Exam: General - Patient is Alert and Oriented Extremity - Neurologically intact Neurovascular intact Intact pulses distally Dorsiflexion/Plantar flexion intact Dressing - dressing C/D/I Motor Function - intact, moving foot and toes well on exam.   Past Medical History:  Diagnosis Date  . Arthritis    back, hips  . Diverticulitis    under control  . Eczema   . GERD (gastroesophageal reflux disease)   . Heart murmur   . History of kidney stones   . Neuromuscular disorder (HCC)    Rt leg   . PONV (postoperative nausea and vomiting)   . Pre-diabetes     Assessment/Plan: 8 Days Post-Op Procedure(s) (LRB): TOTAL HIP ARTHROPLASTY ANTERIOR APPROACH (Right) Principal Problem:   OA (osteoarthritis) of hip Active Problems:   Primary osteoarthritis of right hip  Estimated body mass index is 35.48 kg/m as calculated from  the following:   Height as of this encounter: 5\' 2"  (1.575 m).   Weight as of this encounter: 88 kg. Up with therapy  DVT Prophylaxis - Aspirin and TED hose Weight bearing as tolerated. Continue therapy.  Plan is to go Skilled nursing facility after hospital stay.  Will be ready for discharge to SNF today.   Patient to follow up in clinic with Dr. next week.   The PDMP database was reviewed today prior to any opioid medications being prescribed to this patient.  Lequita Halt, MBA, PA-C Orthopedic Surgery 04/04/2021, 8:16 AM

## 2021-04-04 NOTE — Discharge Summary (Addendum)
Physician Discharge Summary   Patient ID: Tracey Morgan MRN: [id number] DOB/AGE: 73/06/1946 73 y.o.  Admit date: 03/27/2021 Discharge date: 04/04/2021  Primary Diagnosis: Osteoarthritis of Right Hip   Admission Diagnoses:  Past Medical History:  Diagnosis Date  . Arthritis    back, hips  . Diverticulitis    under control  . Eczema   . GERD (gastroesophageal reflux disease)   . Heart murmur   . History of kidney stones   . Neuromuscular disorder (HCC)    Rt leg   . PONV (postoperative nausea and vomiting)   . Pre-diabetes    Discharge Diagnoses:   Principal Problem:   OA (osteoarthritis) of hip Active Problems:   Primary osteoarthritis of right hip  Estimated body mass index is 35.48 kg/m as calculated from the following:   Height as of this encounter: 5\' 2"  (1.575 m).   Weight as of this encounter: 88 kg.  Procedure:  Procedure(s) (LRB): TOTAL HIP ARTHROPLASTY ANTERIOR APPROACH (Right)   Consults: None  HPI: Tracey Morgan is a 75 y.o. female who has advanced end-  stage arthritis of their Right  hip with progressively worsening pain and  dysfunction.The patient has failed nonoperative management and presents for  total hip arthroplasty.   Laboratory Data: Admission on 03/27/2021  Component Date Value Ref Range Status  . ABO/RH(D) 03/27/2021    Final                   Value:A POS Performed at [facility], [address] [place], [place] [address]   . WBC 03/28/2021 10.7* 4.0 - 10.5 K/uL Final  . RBC 03/28/2021 3.16* 3.87 - 5.11 MIL/uL Final  . Hemoglobin 03/28/2021 10.1* 12.0 - 15.0 g/dL Final  . HCT 65/78/469604/21/2022 30.4* 36.0 - 46.0 % Final  . MCV 03/28/2021 96.2  80.0 - 100.0 fL Final  . MCH 03/28/2021 32.0  26.0 - 34.0 pg Final  . MCHC 03/28/2021 33.2  30.0 - 36.0 g/dL Final  . RDW 29/52/841304/21/2022 12.0  11.5 - 15.5 % Final  . Platelets 03/28/2021 270  150 - 400 K/uL Final  . nRBC 03/28/2021 0.0  0.0 - 0.2 % Final   Performed at [facility], [address] [place], [place] [address]  . Sodium 03/28/2021 139  135 - 145 mmol/L Final  . Potassium 03/28/2021 3.9  3.5 - 5.1 mmol/L Final  . Chloride 03/28/2021 107  98 - 111 mmol/L Final  . CO2 03/28/2021 26  22 - 32 mmol/L Final  . Glucose, Bld 03/28/2021 157* 70 - 99 mg/dL Final   Glucose reference range applies only to samples taken after fasting for at least 8 hours.  . BUN 03/28/2021 10  8 - 23 mg/dL Final  . Creatinine, Ser 03/28/2021 0.69  0.44 - 1.00 mg/dL Final  . Calcium 02/72/536604/21/2022 8.8* 8.9 - 10.3 mg/dL Final  . GFR, Estimated 03/28/2021 >60  >60 mL/min Final   Comment: (NOTE) Calculated using the CKD-EPI Creatinine Equation (2021)   . Anion gap 03/28/2021 6  5 - 15 Final   Performed at [facility], [address] [place], [place] [address]  . WBC 03/29/2021 10.7* 4.0 - 10.5 K/uL Final  . RBC 03/29/2021 3.00* 3.87 - 5.11 MIL/uL Final  . Hemoglobin 03/29/2021 9.6* 12.0 - 15.0 g/dL Final  . HCT 74/25/956304/22/2022 28.9* 36.0 - 46.0 % Final  . MCV 03/29/2021 96.3  80.0 - 100.0 fL Final  . MCH  03/29/2021 32.0  26.0 - 34.0 pg Final  . MCHC 03/29/2021 33.2  30.0 - 36.0 g/dL Final  . RDW 17/61/6073 12.0  11.5 - 15.5 % Final  . Platelets 03/29/2021 224  150 - 400 K/uL Final  . nRBC 03/29/2021 0.0  0.0 - 0.2 % Final   Performed at [facility], [address], [place], [place] [address]  . Sodium 03/29/2021 138  135 - 145 mmol/L Final  . Potassium 03/29/2021 4.0  3.5 - 5.1 mmol/L Final  . Chloride 03/29/2021 105  98 - 111 mmol/L Final  . CO2 03/29/2021 27  22 - 32 mmol/L Final  . Glucose, Bld 03/29/2021 162* 70 - 99 mg/dL Final   Glucose reference range applies only to samples taken after fasting for at least 8 hours.  . BUN 03/29/2021 14  8 - 23 mg/dL Final  . Creatinine, Ser 03/29/2021 0.57  0.44 - 1.00 mg/dL Final  . Calcium 69/48/5462 8.9  8.9 - 10.3 mg/dL Final  . GFR, Estimated 03/29/2021 >60  >60 mL/min Final    Comment: (NOTE) Calculated using the CKD-EPI Creatinine Equation (2021)   . Anion gap 03/29/2021 6  5 - 15 Final   Performed at [facility], [address], [place], [place] [address]  . WBC 03/30/2021 9.1  4.0 - 10.5 K/uL Final  . RBC 03/30/2021 2.95* 3.87 - 5.11 MIL/uL Final  . Hemoglobin 03/30/2021 9.3* 12.0 - 15.0 g/dL Final  . HCT 09/38/1829 29.1* 36.0 - 46.0 % Final  . MCV 03/30/2021 98.6  80.0 - 100.0 fL Final  . MCH 03/30/2021 31.5  26.0 - 34.0 pg Final  . MCHC 03/30/2021 32.0  30.0 - 36.0 g/dL Final  . RDW 93/71/6967 12.3  11.5 - 15.5 % Final  . Platelets 03/30/2021 233  150 - 400 K/uL Final  . nRBC 03/30/2021 0.0  0.0 - 0.2 % Final   Performed at [facility], [address] [place], [place] [address]  Hospital Outpatient Visit on 03/25/2021  Component Date Value Ref Range Status  . SARS Coronavirus 2 03/25/2021 NEGATIVE  NEGATIVE Final   Comment: (NOTE) SARS-CoV-2 target nucleic acids are NOT DETECTED.  The SARS-CoV-2 RNA is generally detectable in upper and lower respiratory specimens during the acute phase of infection. Negative results do not preclude SARS-CoV-2 infection, do not rule out co-infections with other pathogens, and should not be used as the sole basis for treatment or other patient management decisions. Negative results must be combined with clinical observations, patient history, and epidemiological information. The expected result is Negative.  Fact Sheet for Patients: HairSlick.no  Fact Sheet for Healthcare Providers: quierodirigir.com  This test is not yet approved or cleared by the [place] FDA and  has been authorized for detection and/or diagnosis of SARS-CoV-2 by FDA under an Emergency Use Authorization (EUA). This EUA will remain  in effect (meaning this test can be used) for the duration of the COVID-19 declaration under Se                           ction 564(b)(1) of the Act, 21 U.S.C. section 360bbb-3(b)(1), unless the authorization is terminated or revoked sooner.  Performed at [facility], [address], [place], [place] [address]   Hospital Outpatient Visit on 03/21/2021  Component Date Value Ref Range Status  . WBC 03/21/2021 4.7  4.0 - 10.5 K/uL Final  . RBC 03/21/2021  4.41  3.87 - 5.11 MIL/uL Final  . Hemoglobin 03/21/2021 13.9  12.0 - 15.0 g/dL Final  . HCT 16/09/9603 42.1  36.0 - 46.0 % Final  . MCV 03/21/2021 95.5  80.0 - 100.0 fL Final  . MCH 03/21/2021 31.5  26.0 - 34.0 pg Final  . MCHC 03/21/2021 33.0  30.0 - 36.0 g/dL Final  . RDW 54/08/8118 11.9  11.5 - 15.5 % Final  . Platelets 03/21/2021 322  150 - 400 K/uL Final  . nRBC 03/21/2021 0.0  0.0 - 0.2 % Final   Performed at [facility], [address], [place], [place] [address]  . Sodium 03/21/2021 139  135 - 145 mmol/L Final  . Potassium 03/21/2021 4.3  3.5 - 5.1 mmol/L Final  . Chloride 03/21/2021 105  98 - 111 mmol/L Final  . CO2 03/21/2021 27  22 - 32 mmol/L Final  . Glucose, Bld 03/21/2021 136* 70 - 99 mg/dL Final   Glucose reference range applies only to samples taken after fasting for at least 8 hours.  . BUN 03/21/2021 17  8 - 23 mg/dL Final  . Creatinine, Ser 03/21/2021 0.71  0.44 - 1.00 mg/dL Final  . Calcium 95/62/1308 9.5  8.9 - 10.3 mg/dL Final  . Total Protein 03/21/2021 7.0  6.5 - 8.1 g/dL Final  . Albumin 65/78/4696 4.0  3.5 - 5.0 g/dL Final  . AST 29/52/8413 22  15 - 41 U/L Final  . ALT 03/21/2021 23  0 - 44 U/L Final  . Alkaline Phosphatase 03/21/2021 112  38 - 126 U/L Final  . Total Bilirubin 03/21/2021 1.2  0.3 - 1.2 mg/dL Final  . GFR, Estimated 03/21/2021 >60  >60 mL/min Final   Comment: (NOTE) Calculated using the CKD-EPI Creatinine Equation (2021)   . Anion gap 03/21/2021 7  5 - 15 Final   Performed at [facility], [address] [place], [place] [address]  . Prothrombin Time  03/21/2021 12.6  11.4 - 15.2 seconds Final  . INR 03/21/2021 0.9  0.8 - 1.2 Final   Comment: (NOTE) INR goal varies based on device and disease states. Performed at [facility], [address], [place], [place] [address]   . aPTT 03/21/2021 29  24 - 36 seconds Final   Performed at [facility], [address] [place], [place] [address]  . ABO/RH(D) 03/21/2021 A POS   Final  . Antibody Screen 03/21/2021 NEG   Final  . Sample Expiration 03/21/2021 03/30/2021,2359   Final  . Extend sample reason 03/21/2021    Final                   Value:NO TRANSFUSIONS OR PREGNANCY IN THE PAST 3 MONTHS Performed at [facility], [address], [place], [place] [address]   . MRSA, PCR 03/21/2021 NEGATIVE  NEGATIVE Final  . Staphylococcus aureus 03/21/2021 NEGATIVE  NEGATIVE Final   Comment: (NOTE) The Xpert SA Assay (FDA approved for NASAL specimens in patients 19 years of age and older), is one component of a comprehensive surveillance program. It is not intended to diagnose infection nor to guide or monitor treatment. Performed at [facility], [address], [place], [place] [address]   . Hgb A1c MFr Bld 03/21/2021 5.6  4.8 - 5.6 % Final   Comment: (NOTE) Pre diabetes:          5.7%-6.4%  Diabetes:              >  6.4%  Glycemic control for   <7.0% adults with diabetes   . Mean Plasma Glucose 03/21/2021 114.02  mg/dL Final   Performed at [facility], [address], [place], [place] [address]     X-Rays:MR LUMBAR SPINE WO CONTRAST  Result Date: 04/03/2021 CLINICAL DATA:  Low back pain with right leg pain. History of back surgery. EXAM: MRI LUMBAR SPINE WITHOUT CONTRAST TECHNIQUE: Multiplanar, multisequence MR imaging of the lumbar spine was performed. No intravenous contrast was administered. COMPARISON:  CT abdomen pelvis 05/06/2019.  Lumbar MRI 05/04/2015 FINDINGS: Segmentation:  Normal Alignment:  Mild  anterolisthesis L5-S1. Vertebrae: Moderate compression fracture of L2 vertebral body, chronic and unchanged from prior studies. No acute fracture. 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 Conus medullaris and cauda equina: Conus extends to the T12-L1 level. Conus and cauda equina appear normal. 6 mm rounded mass in the spinal canal posteriorly on the right at T12-L1 is unchanged from the prior MRI and CT. This is calcified on CT and may be dural based in location. No compression of the tip of the conus medullaris which is at this level. Paraspinal and other soft tissues: Negative for paraspinous mass or adenopathy Disc levels: T12-L1: Negative L1-2: Disc degeneration with disc bulging and endplate spurring. Mild left subarticular stenosis. Mild facet degeneration bilaterally. No change from the prior MRI L2-3: Mild disc and mild facet degeneration.  Negative for stenosis L3-4: Mild disc and mild facet degeneration.  Negative for stenosis L4-5: Left laminectomy. Advanced disc degeneration with disc space narrowing and diffuse endplate spurring. Bilateral facet hypertrophy. Moderate subarticular stenosis bilaterally. Interval improvement in left-sided disc protrusion compared with the prior MRI 2016 L5-S1: Mild anterolisthesis. Bilateral facet degeneration. No significant stenosis. IMPRESSION: 1. 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 Left laminectomy L4-5. Interval improvement in left-sided disc protrusion since 2016. There remains moderate subarticular stenosis bilaterally due to spurring 3. 6 mm calcified mass in the posterior spinal canal on the right at T12-L1. This is unchanged from 2016. Based on the CT, this may be a calcified dural-based suggesting meningioma. Electronically Signed   By: Marlan Palau M.D.   On: 04/03/2021 08:26   DG Pelvis Portable  Result Date: 03/27/2021 CLINICAL DATA:  Status post right total hip arthroplasty. EXAM: PORTABLE PELVIS  1-2 VIEWS COMPARISON:  Earlier today FINDINGS: Hardware components of a right total hip arthroplasty device identified. The prosthesis is in anatomic alignment. No signs of periprosthetic fracture. IMPRESSION: Status post right total hip arthroplasty. Electronically Signed   By: Signa Kell M.D.   On: 03/27/2021 13:57   DG C-Arm 1-60 Min-No Report  Result Date: 03/27/2021 Fluoroscopy was utilized by the requesting physician.  No radiographic interpretation.   DG HIP OPERATIVE UNILAT W OR W/O PELVIS RIGHT  Result Date: 03/27/2021 CLINICAL DATA:  Hip replacement EXAM: OPERATIVE RIGHT HIP (WITH PELVIS IF PERFORMED) 1 VIEWS TECHNIQUE: Fluoroscopic spot image(s) were submitted for interpretation post-operatively. COMPARISON:  None. FINDINGS: Frontal views of the right hip demonstrate severe for preoperative osteoarthritis. There is subsequent right total hip arthroplasty with 2 acetabular screws. IMPRESSION: Right total hip arthroplasty. Electronically Signed   By: Guadlupe Spanish M.D.   On: 03/27/2021 14:14    EKG: Orders placed or performed during the hospital encounter of 03/21/21  . EKG 12 lead per protocol  . EKG 12 lead per protocol     Hospital Course: IVYANA LOCEY is a 75 y.o. who was admitted to  [facility]. They were brought to the operating room on 03/27/2021 and underwent Procedure(s): TOTAL HIP ARTHROPLASTY ANTERIOR APPROACH.  Patient tolerated the procedure well and was later transferred to the recovery room and then to the orthopaedic floor for postoperative care. They were given PO and IV analgesics for pain control following their surgery. They were given 24 hours of postoperative antibiotics of  Anti-infectives (From admission, onward)   Start     Dose/Rate Route Frequency Ordered Stop   03/27/21 1700  ceFAZolin (ANCEF) IVPB 2g/100 mL premix        2 g 200 mL/hr over 30 Minutes Intravenous Every 6 hours 03/27/21 1527 03/27/21 2347   03/27/21 0845  ceFAZolin (ANCEF)  IVPB 2g/100 mL premix        2 g 200 mL/hr over 30 Minutes Intravenous On call to O.R. 03/27/21 0981 03/27/21 1125     and started on DVT prophylaxis in the form of Aspirin and TED hose.   PT and OT were ordered for total joint protocol. Discharge planning consulted to help with postop disposition and equipment needs. Patient had an uneventful night on the evening of surgery. They started to get up OOB with therapy on 03/27/21. Continued to work with therapy over the course of 03/28/21 through today, and has slowly continued to progress. During her stay, she also began to experience severe back pain, warranting a MRI which revealed multilevel disc and facet degeneration through lumbar spine and a 6mm calcified mass in the posterior canal on the right at T12-L1, that remains unchanged since first seen in 2016. Ultimately we determined that she would benefit greatly from SNF placement. and   Pt was seen during rounds on 04/04/21 and was ready to go to SNF.   Diet: Regular diet Activity: WBAT Follow-up: in two weeks Disposition: Skilled nursing facility Discharged Condition: good   Discharge Instructions    Call MD / Call 911   Complete by: As directed    If you experience chest pain or shortness of breath, CALL 911 and be transported to the hospital emergency room.  If you develope a fever above 101 F, pus (white drainage) or increased drainage or redness at the wound, or calf pain, call your surgeon's office.   Call MD / Call 911   Complete by: As directed    If you experience chest pain or shortness of breath, CALL 911 and be transported to the hospital emergency room.  If you develope a fever above 101 F, pus (white drainage) or increased drainage or redness at the wound, or calf pain, call your surgeon's office.   Change dressing   Complete by: As directed    You have an adhesive waterproof bandage over the incision. Leave this in place until your first follow-up appointment. Once you remove  this you will not need to place another bandage.   Change dressing   Complete by: As directed    You have an adhesive waterproof bandage over the incision. Leave this in place until your first follow-up appointment. Once you remove this you will not need to place another bandage.   Constipation Prevention   Complete by: As directed    Drink plenty of fluids.  Prune juice may be helpful.  You may use a stool softener, such as Colace (over the counter) 100 mg twice a day.  Use MiraLax (over the counter) for constipation as needed.   Constipation Prevention   Complete by: As directed    Drink  plenty of fluids.  Prune juice may be helpful.  You may use a stool softener, such as Colace (over the counter) 100 mg twice a day.  Use MiraLax (over the counter) for constipation as needed.   Diet - low sodium heart healthy   Complete by: As directed    Diet - low sodium heart healthy   Complete by: As directed    Do not sit on low chairs, stoools or toilet seats, as it may be difficult to get up from low surfaces   Complete by: As directed    Do not sit on low chairs, stoools or toilet seats, as it may be difficult to get up from low surfaces   Complete by: As directed    Driving restrictions   Complete by: As directed    No driving for two weeks   Driving restrictions   Complete by: As directed    No driving for two weeks   Post-operative opioid taper instructions:   Complete by: As directed    POST-OPERATIVE OPIOID TAPER INSTRUCTIONS: It is important to wean off of your opioid medication as soon as possible. If you do not need pain medication after your surgery it is ok to stop day one. Opioids include: Codeine, Hydrocodone(Norco, Vicodin), Oxycodone(Percocet, oxycontin) and hydromorphone amongst others.  Long term and even short term use of opiods can cause: Increased pain response Dependence Constipation Depression Respiratory depression And more.  Withdrawal symptoms can include Flu  like symptoms Nausea, vomiting And more Techniques to manage these symptoms Hydrate well Eat regular healthy meals Stay active Use relaxation techniques(deep breathing, meditating, yoga) Do Not substitute Alcohol to help with tapering If you have been on opioids for less than two weeks and do not have pain than it is ok to stop all together.  Plan to wean off of opioids This plan should start within one week post op of your joint replacement. Maintain the same interval or time between taking each dose and first decrease the dose.  Cut the total daily intake of opioids by one tablet each day Next start to increase the time between doses. The last dose that should be eliminated is the evening dose.      Post-operative opioid taper instructions:   Complete by: As directed    POST-OPERATIVE OPIOID TAPER INSTRUCTIONS: It is important to wean off of your opioid medication as soon as possible. If you do not need pain medication after your surgery it is ok to stop day one. Opioids include: Codeine, Hydrocodone(Norco, Vicodin), Oxycodone(Percocet, oxycontin) and hydromorphone amongst others.  Long term and even short term use of opiods can cause: Increased pain response Dependence Constipation Depression Respiratory depression And more.  Withdrawal symptoms can include Flu like symptoms Nausea, vomiting And more Techniques to manage these symptoms Hydrate well Eat regular healthy meals Stay active Use relaxation techniques(deep breathing, meditating, yoga) Do Not substitute Alcohol to help with tapering If you have been on opioids for less than two weeks and do not have pain than it is ok to stop all together.  Plan to wean off of opioids This plan should start within one week post op of your joint replacement. Maintain the same interval or time between taking each dose and first decrease the dose.  Cut the total daily intake of opioids by one tablet each day Next start to  increase the time between doses. The last dose that should be eliminated is the evening dose.      TED  hose   Complete by: As directed    Use stockings (TED hose) for three weeks on both leg(s).  You may remove them at night for sleeping.   TED hose   Complete by: As directed    Use stockings (TED hose) for three weeks on both leg(s).  You may remove them at night for sleeping.   Weight bearing as tolerated   Complete by: As directed    Weight bearing as tolerated   Complete by: As directed      Allergies as of 04/04/2021      Reactions   Hydralazine Anaphylaxis, Nausea Only   Patient had nausea and headache with po hydralazine     Metformin Other (See Comments)   Severe colitis.   Metronidazole Other (See Comments)   Not specified   Percocet [oxycodone-acetaminophen]    Pt is unsure    Buprenorphine Hcl Nausea And Vomiting, Rash, Other (See Comments)   hallucinations   Codeine Nausea And Vomiting, Rash, Other (See Comments)   Hallucinations   Hydrocodone Nausea And Vomiting, Rash   Morphine And Related Nausea And Vomiting, Rash, Other (See Comments)   hallucinations      Medication List    STOP taking these medications   acetaminophen 500 MG tablet Commonly known as: TYLENOL   aspirin-acetaminophen-caffeine 250-250-65 MG tablet Commonly known as: EXCEDRIN MIGRAINE     TAKE these medications   alum & mag hydroxide-simeth 200-200-20 MG/5ML suspension Commonly known as: MAALOX/MYLANTA Take 30 mLs by mouth every 6 (six) hours as needed for indigestion or heartburn.   aspirin 325 MG EC tablet Take 1 tablet (325 mg total) by mouth 2 (two) times daily. Then take one 81 mg aspirin once a day for three weeks. Then discontinue aspirin.   cetirizine 10 MG tablet Commonly known as: ZYRTEC Take 10 mg by mouth daily.   Cinnamon 500 MG capsule Take 500 mg by mouth in the morning, at noon, and at bedtime.   clobetasol cream 0.05 % Commonly known as: TEMOVATE Apply 1  application topically 2 (two) times daily as needed (eczema).   diazepam 5 MG tablet Commonly known as: VALIUM Take 1 tablet (5 mg total) by mouth every 6 (six) hours as needed for muscle spasms.   diphenhydrAMINE 25 MG tablet Commonly known as: BENADRYL Take 25 mg by mouth at bedtime.   fluticasone 50 MCG/ACT nasal spray Commonly known as: FLONASE Place 2 sprays into the nose daily.   gabapentin 100 MG capsule Commonly known as: NEURONTIN Take 1 capsule (100 mg total) by mouth 2 (two) times daily.   guaiFENesin 600 MG 12 hr tablet Commonly known as: MUCINEX Take 600 mg by mouth 2 (two) times daily as needed for to loosen phlegm or cough.   hydrochlorothiazide 12.5 MG tablet Commonly known as: HYDRODIURIL Take 12.5 mg by mouth 3 (three) times a week.   HYDROmorphone 2 MG tablet Commonly known as: DILAUDID Take 1-2 tablets (2-4 mg total) by mouth every 6 (six) hours as needed for severe pain.   ketotifen 0.025 % ophthalmic solution Commonly known as: ZADITOR Place 1 drop into both eyes 2 (two) times daily as needed (eye allergies).   Magnesium 250 MG Tabs Take 250 mg by mouth daily.   meclizine 25 MG tablet Commonly known as: ANTIVERT Take 25 mg by mouth 3 (three) times daily as needed for dizziness.   polyethylene glycol 17 g packet Commonly known as: MIRALAX / GLYCOLAX Take 17 g by mouth every other  day.   PRESERVISION AREDS 2+MULTI VIT PO Take 1 capsule by mouth in the morning and at bedtime.   traMADol 50 MG tablet Commonly known as: ULTRAM Take 50 mg by mouth every 8 (eight) hours as needed for severe pain.   Vitamin D 50 MCG (2000 UT) tablet Take 2,000 Units by mouth daily.   ZINC-VITAMIN C PO Take 1 tablet by mouth daily.            Durable Medical Equipment  (From admission, onward)         Start     Ordered   04/01/21 1313  For home use only DME 3 n 1  Once        04/01/21 1312           Discharge Care Instructions  (From admission,  onward)         Start     Ordered   04/04/21 0000  Weight bearing as tolerated        04/04/21 0823   04/04/21 0000  Change dressing       Comments: You have an adhesive waterproof bandage over the incision. Leave this in place until your first follow-up appointment. Once you remove this you will not need to place another bandage.   04/04/21 0823   03/28/21 0000  Weight bearing as tolerated        03/28/21 0752   03/28/21 0000  Change dressing       Comments: You have an adhesive waterproof bandage over the incision. Leave this in place until your first follow-up appointment. Once you remove this you will not need to place another bandage.   03/28/21 9833          Follow-up Information    Ollen Gross, MD. Schedule an appointment as soon as possible for a visit on 04/09/2021.   Specialty: Orthopedic Surgery Contact information: [address] [place] [address] [phone]        [facility] Follow up.   Specialty: Home Health Services Why: PT Contact information: [address] [place] [address] [phone]               Signed: Nelia Shi, MBA, PA-C Orthopedic Surgery 04/04/2021, 10:33 AM

## 2021-04-04 NOTE — Progress Notes (Signed)
Physical Therapy Treatment Patient Details Name: Tracey Morgan MRN: [id number] DOB: 1946/11/10 Today's Date: 04/04/2021    History of Present Illness Patient is 75 y.o. female s/p R THA-DA approach on 03/27/21 with PMH significant for neuromuscular disorder, GERD, OA, remote back surgery.    PT Comments    Pt stood with RW for several minutes for pericare following a BM. She then ambulated 79' with RW, distance limited by R thigh/groin pain. Noted plan to DC to SNF today.   Follow Up Recommendations  Follow surgeon's recommendation for DC plan and follow-up therapies;Supervision/Assistance - 24 hour;SNF (pt requesting SNF)     Equipment Recommendations       Recommendations for Other Services       Precautions / Restrictions Precautions Precautions: Fall Restrictions RLE Weight Bearing: Weight bearing as tolerated Other Position/Activity Restrictions: WBAT    Mobility  Bed Mobility               General bed mobility comments: up in recliner    Transfers Overall transfer level: Needs assistance Equipment used: Rolling walker (2 wheeled) Transfers: Sit to/from Stand Sit to Stand: Supervision         General transfer comment: self able to rise but with increased effort and c/o "back" pain the "shoots" down R anterior thigh  Ambulation/Gait Ambulation/Gait assistance: Supervision Gait Distance (Feet): 15 Feet Assistive device: Rolling walker (2 wheeled) Gait Pattern/deviations: Step-to pattern;Antalgic Gait velocity: decr   General Gait Details: pt stood for several minutes for pericare following BM, then assisted her to walk from bathroom to recliner, distance limited by pain   Stairs             Wheelchair Mobility    Modified Rankin (Stroke Patients Only)       Balance Overall balance assessment: Needs assistance Sitting-balance support: Feet supported;No upper extremity supported Sitting balance-Leahy Scale: Good     Standing balance  support: Bilateral upper extremity supported Standing balance-Leahy Scale: Poor                              Cognition Arousal/Alertness: Awake/alert Behavior During Therapy: WFL for tasks assessed/performed Overall Cognitive Status: Within Functional Limits for tasks assessed                                        Exercises      General Comments        Pertinent Vitals/Pain Pain Score: 8  Pain Location: R groin and thigh Pain Descriptors / Indicators: Shooting Pain Intervention(s): Limited activity within patient's tolerance;Monitored during session;Patient requesting pain meds-RN notified    Home Living                      Prior Function            PT Goals (current goals can now be found in the care plan section) Acute Rehab PT Goals Patient Stated Goal: stop hurting, 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 PT Goal Formulation: With patient Time For Goal Achievement: 04/12/21 Potential to Achieve Goals: Fair Progress towards PT goals: Progressing toward goals    Frequency    7X/week      PT Plan Current plan remains appropriate    Co-evaluation              AM-PAC  PT "6 Clicks" Mobility   Outcome Measure  Help needed turning from your back to your side while in a flat bed without using bedrails?: A Little Help needed moving from lying on your back to sitting on the side of a flat bed without using bedrails?: A Little Help needed moving to and from a bed to a chair (including a wheelchair)?: A Little Help needed standing up from a chair using your arms (e.g., wheelchair or bedside chair)?: A Little Help needed to walk in hospital room?: A Little Help needed climbing 3-5 steps with a railing? : A Lot 6 Click Score: 17    End of Session Equipment Utilized During Treatment: Gait belt Activity Tolerance: Patient limited by pain Patient left: with call bell/phone within reach;in chair;with chair alarm set Nurse  Communication: Mobility status;Patient requests pain meds PT Visit Diagnosis: Other abnormalities of gait and mobility (R26.89);Pain Pain - Right/Left: Right Pain - part of body: Hip     Time: 2423-5361 PT Time Calculation (min) (ACUTE ONLY): 16 min  Charges:  $Gait Training: 8-22 mins                    Ralene Bathe Kistler PT 04/04/2021  Acute Rehabilitation Services Pager [phone] Office [phone]

## 2021-04-04 NOTE — Progress Notes (Signed)
PT Cancellation Note  Patient Details Name: Tracey Morgan MRN: [id number] DOB: [birth year]   Cancelled Treatment:    Reason Eval/Treat Not Completed: Other (comment) ("I don't have time to work with you right now. I need to find  a rehab place to go to today". Will attempt later today.)  Tamala Ser PT 04/04/2021  Acute Rehabilitation Services Pager [phone] Office [phone]

## 2021-04-05 DIAGNOSIS — I10 Essential (primary) hypertension: Secondary | ICD-10-CM | POA: Diagnosis not present

## 2021-04-05 DIAGNOSIS — Z471 Aftercare following joint replacement surgery: Secondary | ICD-10-CM | POA: Diagnosis not present

## 2021-04-05 DIAGNOSIS — M1611 Unilateral primary osteoarthritis, right hip: Secondary | ICD-10-CM | POA: Diagnosis not present

## 2021-04-05 DIAGNOSIS — M545 Low back pain, unspecified: Secondary | ICD-10-CM | POA: Diagnosis not present

## 2021-04-05 DIAGNOSIS — Z96641 Presence of right artificial hip joint: Secondary | ICD-10-CM | POA: Diagnosis not present

## 2021-04-05 DIAGNOSIS — G8929 Other chronic pain: Secondary | ICD-10-CM | POA: Diagnosis not present

## 2021-04-05 DIAGNOSIS — J3089 Other allergic rhinitis: Secondary | ICD-10-CM | POA: Diagnosis not present

## 2021-04-11 DIAGNOSIS — Z7409 Other reduced mobility: Secondary | ICD-10-CM | POA: Diagnosis not present

## 2021-04-11 DIAGNOSIS — M62838 Other muscle spasm: Secondary | ICD-10-CM | POA: Diagnosis not present

## 2021-04-11 DIAGNOSIS — Z789 Other specified health status: Secondary | ICD-10-CM | POA: Diagnosis not present

## 2021-04-11 DIAGNOSIS — G8918 Other acute postprocedural pain: Secondary | ICD-10-CM | POA: Diagnosis not present

## 2021-04-11 DIAGNOSIS — M25551 Pain in right hip: Secondary | ICD-10-CM | POA: Diagnosis not present

## 2021-04-22 DIAGNOSIS — G8929 Other chronic pain: Secondary | ICD-10-CM | POA: Diagnosis not present

## 2021-04-22 DIAGNOSIS — R7303 Prediabetes: Secondary | ICD-10-CM | POA: Diagnosis not present

## 2021-04-22 DIAGNOSIS — Z7982 Long term (current) use of aspirin: Secondary | ICD-10-CM | POA: Diagnosis not present

## 2021-04-22 DIAGNOSIS — Z96641 Presence of right artificial hip joint: Secondary | ICD-10-CM | POA: Diagnosis not present

## 2021-04-22 DIAGNOSIS — K219 Gastro-esophageal reflux disease without esophagitis: Secondary | ICD-10-CM | POA: Diagnosis not present

## 2021-04-22 DIAGNOSIS — I10 Essential (primary) hypertension: Secondary | ICD-10-CM | POA: Diagnosis not present

## 2021-04-22 DIAGNOSIS — Z471 Aftercare following joint replacement surgery: Secondary | ICD-10-CM | POA: Diagnosis not present

## 2021-04-22 DIAGNOSIS — Z79899 Other long term (current) drug therapy: Secondary | ICD-10-CM | POA: Diagnosis not present

## 2021-04-22 DIAGNOSIS — M1711 Unilateral primary osteoarthritis, right knee: Secondary | ICD-10-CM | POA: Diagnosis not present

## 2021-04-22 DIAGNOSIS — M5136 Other intervertebral disc degeneration, lumbar region: Secondary | ICD-10-CM | POA: Diagnosis not present

## 2021-04-22 DIAGNOSIS — Z9181 History of falling: Secondary | ICD-10-CM | POA: Diagnosis not present

## 2021-04-30 DIAGNOSIS — Z471 Aftercare following joint replacement surgery: Secondary | ICD-10-CM | POA: Diagnosis not present

## 2021-04-30 DIAGNOSIS — Z96641 Presence of right artificial hip joint: Secondary | ICD-10-CM | POA: Diagnosis not present

## 2021-04-30 DIAGNOSIS — M25561 Pain in right knee: Secondary | ICD-10-CM | POA: Diagnosis not present

## 2021-05-09 DIAGNOSIS — Z7982 Long term (current) use of aspirin: Secondary | ICD-10-CM | POA: Diagnosis not present

## 2021-05-09 DIAGNOSIS — Z79899 Other long term (current) drug therapy: Secondary | ICD-10-CM | POA: Diagnosis not present

## 2021-05-09 DIAGNOSIS — Z471 Aftercare following joint replacement surgery: Secondary | ICD-10-CM | POA: Diagnosis not present

## 2021-05-09 DIAGNOSIS — M5136 Other intervertebral disc degeneration, lumbar region: Secondary | ICD-10-CM | POA: Diagnosis not present

## 2021-05-09 DIAGNOSIS — Z9181 History of falling: Secondary | ICD-10-CM | POA: Diagnosis not present

## 2021-05-09 DIAGNOSIS — K219 Gastro-esophageal reflux disease without esophagitis: Secondary | ICD-10-CM | POA: Diagnosis not present

## 2021-05-09 DIAGNOSIS — G8929 Other chronic pain: Secondary | ICD-10-CM | POA: Diagnosis not present

## 2021-05-09 DIAGNOSIS — R7303 Prediabetes: Secondary | ICD-10-CM | POA: Diagnosis not present

## 2021-05-09 DIAGNOSIS — Z96641 Presence of right artificial hip joint: Secondary | ICD-10-CM | POA: Diagnosis not present

## 2021-05-09 DIAGNOSIS — M1711 Unilateral primary osteoarthritis, right knee: Secondary | ICD-10-CM | POA: Diagnosis not present

## 2021-05-09 DIAGNOSIS — I10 Essential (primary) hypertension: Secondary | ICD-10-CM | POA: Diagnosis not present

## 2021-05-23 DIAGNOSIS — Z8679 Personal history of other diseases of the circulatory system: Secondary | ICD-10-CM | POA: Diagnosis not present

## 2021-05-23 DIAGNOSIS — D539 Nutritional anemia, unspecified: Secondary | ICD-10-CM | POA: Diagnosis not present

## 2021-05-23 DIAGNOSIS — I6522 Occlusion and stenosis of left carotid artery: Secondary | ICD-10-CM | POA: Diagnosis not present

## 2021-05-23 DIAGNOSIS — R7302 Impaired glucose tolerance (oral): Secondary | ICD-10-CM | POA: Diagnosis not present

## 2021-05-23 DIAGNOSIS — Z1159 Encounter for screening for other viral diseases: Secondary | ICD-10-CM | POA: Diagnosis not present

## 2021-05-23 DIAGNOSIS — I4510 Unspecified right bundle-branch block: Secondary | ICD-10-CM | POA: Diagnosis not present

## 2021-05-23 DIAGNOSIS — Z6834 Body mass index (BMI) 34.0-34.9, adult: Secondary | ICD-10-CM | POA: Diagnosis not present

## 2021-05-23 DIAGNOSIS — E041 Nontoxic single thyroid nodule: Secondary | ICD-10-CM | POA: Diagnosis not present

## 2021-05-23 DIAGNOSIS — M160 Bilateral primary osteoarthritis of hip: Secondary | ICD-10-CM | POA: Diagnosis not present

## 2021-05-23 DIAGNOSIS — Z79899 Other long term (current) drug therapy: Secondary | ICD-10-CM | POA: Diagnosis not present

## 2021-05-23 DIAGNOSIS — E782 Mixed hyperlipidemia: Secondary | ICD-10-CM | POA: Diagnosis not present

## 2021-05-23 DIAGNOSIS — E559 Vitamin D deficiency, unspecified: Secondary | ICD-10-CM | POA: Diagnosis not present

## 2021-06-25 DIAGNOSIS — M545 Low back pain, unspecified: Secondary | ICD-10-CM | POA: Diagnosis not present

## 2021-07-09 DIAGNOSIS — R7302 Impaired glucose tolerance (oral): Secondary | ICD-10-CM | POA: Diagnosis not present

## 2021-07-09 DIAGNOSIS — K5792 Diverticulitis of intestine, part unspecified, without perforation or abscess without bleeding: Secondary | ICD-10-CM | POA: Diagnosis not present

## 2021-07-09 DIAGNOSIS — Z6835 Body mass index (BMI) 35.0-35.9, adult: Secondary | ICD-10-CM | POA: Diagnosis not present

## 2021-07-09 DIAGNOSIS — E042 Nontoxic multinodular goiter: Secondary | ICD-10-CM | POA: Diagnosis not present

## 2021-07-09 DIAGNOSIS — K5190 Ulcerative colitis, unspecified, without complications: Secondary | ICD-10-CM | POA: Diagnosis not present

## 2021-08-22 DIAGNOSIS — Z96641 Presence of right artificial hip joint: Secondary | ICD-10-CM | POA: Diagnosis not present

## 2021-08-26 DIAGNOSIS — Z800 Family history of malignant neoplasm of digestive organs: Secondary | ICD-10-CM | POA: Diagnosis not present

## 2021-08-26 DIAGNOSIS — K219 Gastro-esophageal reflux disease without esophagitis: Secondary | ICD-10-CM | POA: Diagnosis not present

## 2021-08-26 DIAGNOSIS — Z86010 Personal history of colonic polyps: Secondary | ICD-10-CM | POA: Diagnosis not present

## 2021-09-05 ENCOUNTER — Encounter: Payer: Self-pay | Admitting: Family

## 2021-09-05 ENCOUNTER — Ambulatory Visit (INDEPENDENT_AMBULATORY_CARE_PROVIDER_SITE_OTHER): Payer: PPO | Admitting: Family

## 2021-09-05 ENCOUNTER — Other Ambulatory Visit: Payer: Self-pay

## 2021-09-05 VITALS — BP 118/78 | HR 76 | Temp 98.1°F | Resp 16 | Ht 61.81 in | Wt 194.4 lb

## 2021-09-05 DIAGNOSIS — R0602 Shortness of breath: Secondary | ICD-10-CM

## 2021-09-05 DIAGNOSIS — J309 Allergic rhinitis, unspecified: Secondary | ICD-10-CM | POA: Diagnosis not present

## 2021-09-05 MED ORDER — ALBUTEROL SULFATE HFA 108 (90 BASE) MCG/ACT IN AERS: 2.0000 | INHALATION_SPRAY | Freq: Four times a day (QID) | RESPIRATORY_TRACT | 1 refills | Status: DC | PRN

## 2021-09-05 MED ORDER — AZELASTINE HCL 0.1 % NA SOLN: 1.0000 | Freq: Two times a day (BID) | NASAL | 5 refills | Status: DC

## 2021-09-05 MED ORDER — FLUTICASONE PROPIONATE 50 MCG/ACT NA SUSP: 2.0000 | Freq: Every day | NASAL | 5 refills | Status: DC

## 2021-09-05 NOTE — Progress Notes (Signed)
[address] [place] [address] Dept: [phone]  FOLLOW UP NOTE  Patient ID: Tracey Morgan, female    DOB: 10/18/1946  Age: 75 y.o. MRN: [id number] Date of Office Visit: 09/05/2021  Assessment  Chief Complaint: Allergic Rhinitis  (Sinus H/As, sneezing ,post nasal drainage, and sore throat in the morning)  HPI Tracey Morgan is a 75 year old female who presents today for acute visit of allergies.  She was last seen on November 23, 2020 by Thermon Leyland, FNP for allergic contact dermatitis.  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  Allergic rhinitis is reported as not well controlled with Flonase nasal spray, Zyrtec 10 mg once a day, Mucinex, as needed, and Allergan eyedrops by [employer].  She reports that she has had allergies her whole life and in the 1980s she was skin tested in [place], [place] and was allergic to ragweed, different weeds, and mostly molds.  She did allergy injections for a period of time but is not sure how long.  The allergy injections helped for several years.  She can no longer take Sudafed due to her blood pressure.  She reports that her allergies have been getting worse since the beginning of the summer.  She reports itchy eyes, sinus headache every day, sneezing, clear rhinorrhea, nasal congestion, postnasal drip, and a sore throat until she drinks something.  She reports cough, congestion in her chest and shortness of breath.  The shortness of breath she has just felt today.  She has never been diagnosed with asthma and does not have any inhalers.  Drug Allergies:  Allergies  Allergen Reactions   Hydralazine Anaphylaxis and Nausea Only    Patient had nausea and headache with po hydralazine 10mg     Metformin Other (See Comments)    Severe colitis.   Metronidazole Other (See Comments)    Not specified   Percocet [Oxycodone-Acetaminophen]     Pt is unsure    Buprenorphine Hcl Nausea And  Vomiting, Rash and Other (See Comments)    hallucinations    Codeine Nausea And Vomiting, Rash and Other (See Comments)    Hallucinations   Hydrocodone Nausea And Vomiting and Rash   Morphine And Related Nausea And Vomiting, Rash and Other (See Comments)    hallucinations    Review of Systems: Review of Systems  Constitutional:  Negative for chills and fever.  HENT:         Reports clear rhinorrhea, nasal congestion, postnasal drip, and sore throat until she get something to drink, sinus headache, and sneezing  Eyes:        Reports itchy watery eyes  Respiratory:  Positive for cough and shortness of breath. Negative for wheezing.        Reports cough due to drainage and a little bit of shortness of breath.  Denies tightness and wheezing.  She has never been diagnosed with asthma.  Cardiovascular:  Positive for palpitations. Negative for chest pain.       Reports that she has an occasional flutter of the heart that she spoke with her heart specialist and they could not find anything  Gastrointestinal:        Reports reflux for which she will take either Mylanta or Pepcid she does not take Pepcid when she is on tramadol and she is not currently on tramadol now.  She is scheduled for an endoscopy at the end of November.  Genitourinary:  Negative for frequency.  Skin:  Negative for itching and rash.       She reports that her eczema is under control  Neurological:  Positive for headaches.  Endo/Heme/Allergies:  Positive for environmental allergies.    Physical Exam: BP 118/78   Pulse 76   Temp 98.1 F (36.7 C) (Tympanic)   Resp 16   Ht 5' 1.81" (1.57 m)   Wt 194 lb 6.4 oz (88.2 kg)   SpO2 99%   BMI 35.77 kg/m    Physical Exam Constitutional:      Appearance: Normal appearance.  HENT:     Head: Normocephalic and atraumatic.     Comments: Pharynx: Mild cobblestoning noted, eyes normal, ears normal, nose: Slight irritation noted in left nostril, bilateral lower turbinates  moderately edematous and moderately erythematous with clear drainage noted.  Right turbinate greater than left turbinate.    Right Ear: Tympanic membrane, ear canal and external ear normal.     Left Ear: Tympanic membrane, ear canal and external ear normal.     Mouth/Throat:     Mouth: Mucous membranes are moist.     Pharynx: Oropharynx is clear.  Eyes:     Conjunctiva/sclera: Conjunctivae normal.  Cardiovascular:     Rate and Rhythm: Normal rate and regular rhythm.     Heart sounds: Normal heart sounds.  Pulmonary:     Effort: Pulmonary effort is normal.     Breath sounds: Normal breath sounds.     Comments: Lungs clear to auscultation Musculoskeletal:     Cervical back: Neck supple.  Skin:    General: Skin is warm.  Neurological:     Mental Status: She is alert and oriented to person, place, and time.  Psychiatric:        Mood and Affect: Mood normal.        Behavior: Behavior normal.        Thought Content: Thought content normal.        Judgment: Judgment normal.    Diagnostics: FVC 2.15 L, FEV1 1.64 L.  Predicted FVC 1.59 L, predicted FEV1 1.17 L.  Spirometry indicates normal ventilatory function.  Assessment and Plan: 1. Allergic rhinitis, unspecified seasonality, unspecified trigger   2. Shortness of breath     Meds ordered this encounter  Medications   azelastine (ASTELIN) 0.1 % nasal spray    Sig: Place 1 spray into both nostrils 2 (two) times daily. Use in each nostril as directed    Dispense:  30 mL    Refill:  5   albuterol (VENTOLIN HFA) 108 (90 Base) MCG/ACT inhaler    Sig: Inhale 2 puffs into the lungs every 6 (six) hours as needed for wheezing or shortness of breath.    Dispense:  18 g    Refill:  1   fluticasone (FLONASE) 50 MCG/ACT nasal spray    Sig: Place 2 sprays into both nostrils daily.    Dispense:  16 g    Refill:  5     Patient Instructions  Allergic rhinitis Continue Flonase (fluticasone) 1 spray each nostril twice a day as needed for  stuffy nose. In the right nostril, point the applicator out toward the right ear. In the left nostril, point the applicator out toward the left ear Continue Zyrtec (cetirizine) 10 mg once a day as needed for runny nose Start azelastine nasal spray using 1 spray each nostril twice a day as needed for runny nose/drainage down the throat Start saline nasal rinse as needed  for nasal symptoms.  Use this prior to any medicated nasal sprays. Continue your over-the-counter eyedrops as needed for itchy watery eyes. Recommend scheduling an appointment for skin testing to environmental allergens.  You will need to be off all antihistamines 3 days prior to this appointment.  Shortness of breath Your breathing test look normal today Use albuterol 2 puffs every 4-6 hours as needed for cough, wheeze, tightness in chest, or shortness of breath.  Let us know if this treatment plan is not working well for you. Schedule a follow-up appointment at your convenience for skin testing to environmental allergens. You will need to be off all antihistamines 3 days prior to this appointment  Return in about 4 weeks (around 10/03/2021), or if symptoms worsen or fail to improve, for skin testing.    Thank you for the opportunity to care for this patient.  Please do not hesitate to contact me with questions.  Nehemiah Settle, FNP [facility]

## 2021-09-05 NOTE — Patient Instructions (Addendum)
Allergic rhinitis Continue Flonase (fluticasone) 1 spray each nostril twice a day as needed for stuffy nose. In the right nostril, point the applicator out toward the right ear. In the left nostril, point the applicator out toward the left ear Continue Zyrtec (cetirizine) 10 mg once a day as needed for runny nose Start azelastine nasal spray using 1 spray each nostril twice a day as needed for runny nose/drainage down the throat Start saline nasal rinse as needed for nasal symptoms.  Use this prior to any medicated nasal sprays. Continue your over-the-counter eyedrops as needed for itchy watery eyes. Recommend scheduling an appointment for skin testing to environmental allergens.  You will need to be off all antihistamines 3 days prior to this appointment.  Shortness of breath Your breathing test look normal today Use albuterol 2 puffs every 4-6 hours as needed for cough, wheeze, tightness in chest, or shortness of breath.  Let us know if this treatment plan is not working well for you. Schedule a follow-up appointment at your convenience for skin testing to environmental allergens. You will need to be off all antihistamines 3 days prior to this appointment

## 2021-09-09 DIAGNOSIS — E119 Type 2 diabetes mellitus without complications: Secondary | ICD-10-CM | POA: Diagnosis not present

## 2021-09-09 DIAGNOSIS — Z7982 Long term (current) use of aspirin: Secondary | ICD-10-CM | POA: Diagnosis not present

## 2021-09-09 DIAGNOSIS — I6529 Occlusion and stenosis of unspecified carotid artery: Secondary | ICD-10-CM | POA: Diagnosis not present

## 2021-09-09 DIAGNOSIS — I10 Essential (primary) hypertension: Secondary | ICD-10-CM | POA: Diagnosis not present

## 2021-09-09 DIAGNOSIS — Z8679 Personal history of other diseases of the circulatory system: Secondary | ICD-10-CM | POA: Diagnosis not present

## 2021-09-27 DIAGNOSIS — U071 COVID-19: Secondary | ICD-10-CM | POA: Diagnosis not present

## 2021-10-12 DIAGNOSIS — J209 Acute bronchitis, unspecified: Secondary | ICD-10-CM | POA: Diagnosis not present

## 2021-10-12 DIAGNOSIS — J019 Acute sinusitis, unspecified: Secondary | ICD-10-CM | POA: Diagnosis not present

## 2021-10-12 DIAGNOSIS — B9689 Other specified bacterial agents as the cause of diseases classified elsewhere: Secondary | ICD-10-CM | POA: Diagnosis not present

## 2021-10-21 NOTE — Patient Instructions (Addendum)
Allergic rhinitis (skin testing today positive to dust mite) Start avoidance measures as below Stop Benadryl Stop Zyrtec (cetirizine).  Start Xyzal 5 mg once a day as needed for runny nose.  You can try alternating Zyrtec and Xyzal every few months to help prevent tachyphylaxis. Stop Flonase (fluticasone)  You can increase azelastine nasal spray using 2 sprays each nostril twice a day as needed for runny nose/drainage down the throat Start saline nasal rinses with Pulmicort (budesonide) 0.5 mg-1 dose twice a day. Lets see if this helps better with your head feeling full and headaches. Continue your over-the-counter eyedrops as needed for itchy watery eyes.  Consider allergy injections as a means of long-term control. - Allergy injections "re-train" and "reset" the immune system to ignore environmental allergens and decrease the resulting immune response to those allergens (sneezing, itchy watery eyes, runny nose, nasal congestion, etc).    - Allergy injections improve symptoms in 75-85% of patients.   - We can discuss this more at the next appointment if the medications are not working for you. -Consider Odactra. I am not sure if this will be covered by your insurance.  Shortness of breath You may use albuterol 2 puffs every 4-6 hours as needed for cough, wheeze, tightness in chest, or shortness of breath.  Let us know if this treatment plan is not working well for you. Schedule a follow-up appointment in 6 weeks or sooner if needed  Control of Dust Mite Allergen Dust mites play a major role in allergic asthma and rhinitis. They occur in environments with high humidity wherever human skin is found. Dust mites absorb humidity from the atmosphere (ie, they do not drink) and feed on organic matter (including shed human and animal skin). Dust mites are a microscopic type of insect that you cannot see with the naked eye. High levels of dust mites have been detected from mattresses, pillows, carpets,  upholstered furniture, bed covers, clothes, soft toys and any woven material. The principal allergen of the dust mite is found in its feces. A gram of dust may contain 1,000 mites and 250,000 fecal particles. Mite antigen is easily measured in the air during house cleaning activities. Dust mites do not bite and do not cause harm to humans, other than by triggering allergies/asthma.  Ways to decrease your exposure to dust mites in your home:  1. Encase mattresses, box springs and pillows with a mite-impermeable barrier or cover  2. Wash sheets, blankets and drapes weekly in hot water (130 F) with detergent and dry them in a dryer on the hot setting.  3. Have the room cleaned frequently with a vacuum cleaner and a damp dust-mop. For carpeting or rugs, vacuuming with a vacuum cleaner equipped with a high-efficiency particulate air (HEPA) filter. The dust mite allergic individual should not be in a room which is being cleaned and should wait 1 hour after cleaning before going into the room.  4. Do not sleep on upholstered furniture (eg, couches).  5. If possible removing carpeting, upholstered furniture and drapery from the home is ideal. Horizontal blinds should be eliminated in the rooms where the person spends the most time (bedroom, study, television room). Washable vinyl, roller-type shades are optimal.  6. Remove all non-washable stuffed toys from the bedroom. Wash stuffed toys weekly like sheets and blankets above.  7. Reduce indoor humidity to less than 50%. Inexpensive humidity monitors can be purchased at most hardware stores. Do not use a humidifier as can make the problem worse  and are not recommended.

## 2021-10-22 ENCOUNTER — Other Ambulatory Visit: Payer: Self-pay

## 2021-10-22 ENCOUNTER — Encounter: Payer: Self-pay | Admitting: Family

## 2021-10-22 ENCOUNTER — Ambulatory Visit: Payer: PPO | Admitting: Family

## 2021-10-22 VITALS — BP 136/74 | HR 84 | Temp 97.5°F | Resp 20

## 2021-10-22 DIAGNOSIS — J3089 Other allergic rhinitis: Secondary | ICD-10-CM | POA: Diagnosis not present

## 2021-10-22 DIAGNOSIS — R0602 Shortness of breath: Secondary | ICD-10-CM

## 2021-10-22 MED ORDER — BUDESONIDE 0.5 MG/2ML IN SUSP: RESPIRATORY_TRACT | 3 refills | Status: DC

## 2021-10-22 MED ORDER — LEVOCETIRIZINE DIHYDROCHLORIDE 5 MG PO TABS: 5.0000 mg | ORAL_TABLET | Freq: Every evening | ORAL | 5 refills | Status: DC

## 2021-10-22 NOTE — Progress Notes (Signed)
[address] [name] [address] Dept: [phone]  FOLLOW UP NOTE  Patient ID: Tracey Morgan, female    DOB: [birth year]  Age: 75 y.o. MRN: [id number] Date of Office Visit: 10/22/2021  Assessment  Chief Complaint: Allergic Rhinitis   HPI Tracey Morgan is a 75 year old female who presents today for skin testing to environmental allergens.  She was last seen on September 05, 2021 by Nehemiah Settle, FNP for allergic rhinitis and shortness of breath.  Since her last office visit she reports that she was diagnosed with COVID-19 a month ago and just finished her last dose of Augmentin today for bronchitis and sinusitis.  She is scheduled for a colonoscopy on November 30.  Allergic rhinitis is reported as not well controlled with fluticasone nasal spray 1 spray each nostril twice a day on most days, Zyrtec 10 mg once a day,  azelastine nasal spray 2 sprays each nostril at night, saline rinses in the morning, and Mucinex 1200 mg twice a day.  She reports that Zyrtec does not seem to be working well for her anymore.  In the past she has tried Insurance underwriter and does not find them to be effective.  She has never tried Xyzal.  When her allergy symptoms are bad she will take Benadryl at night.  She reports white rhinorrhea, nasal congestion, postnasal drip, headache, and the sensation of her head feeling full.  She has had 1 sinus infection since her last office visit.  Shortness of breath is reported as moderately controlled with albuterol as needed.  She reports shortness of breath from coughing.  She also reports coughing that she feels is due to postnasal drip.  She also mentions wheezing occasionally.  She notices wheezing when she does not take her hydrochlorothiazide.  She also reports tightness in her chest at times.  She uses her albuterol inhaler maybe once a week.  Since her last office visit she has not made any trips to the emergency room or urgent care due to breathing problems.   She has also not required any systemic steroids due to breathing problems.  She does not like to take steroids due to it causing her blood sugar to increase up to 600 for several days.   Drug Allergies:  Allergies  Allergen Reactions   Hydralazine Anaphylaxis and Nausea Only    Patient had nausea and headache with po hydralazine 10mg     Metformin Other (See Comments)    Severe colitis.   Metronidazole Other (See Comments)    Not specified   Percocet [Oxycodone-Acetaminophen]     Pt is unsure    Buprenorphine Hcl Nausea And Vomiting, Rash and Other (See Comments)    hallucinations    Codeine Nausea And Vomiting, Rash and Other (See Comments)    Hallucinations   Hydrocodone Nausea And Vomiting and Rash   Morphine And Related Nausea And Vomiting, Rash and Other (See Comments)    hallucinations    Review of Systems: Review of Systems  Constitutional:  Negative for chills and fever.  HENT:         Reports white rhinorrhea, nasal congestion, and postnasal drip  Eyes:        Denies itchy watery eyes.  She reports that this is worse in the spring and early fall  Respiratory:  Positive for cough, shortness of breath and wheezing.        Reports coughing due to postnasal drip.  Reports occasional wheezing if she forgets  her hydrochlorothiazide.  Reports shortness of breath from coughing with bronchitis/postnasal drip  Cardiovascular:  Negative for chest pain and palpitations.  Gastrointestinal:        Reports heartburn/reflux is controlled as long as she takes Pepcid 20 mg at night  Genitourinary:  Negative for frequency.  Skin:  Positive for itching.       Reports itching due to eczema  Neurological:  Positive for headaches.    Physical Exam: BP 136/74 (BP Location: Right Arm, Patient Position: Sitting, Cuff Size: Normal)   Pulse 84   Temp (!) 97.5 F (36.4 C) (Temporal)   Resp 20   SpO2 100%    Physical Exam Constitutional:      Appearance: Normal appearance.  HENT:      Head: Normocephalic and atraumatic.     Comments: Pharynx normal, eyes normal, ears normal, nose: Bilateral lower turbinates mildly edematous and slightly erythematous with clear drainage noted    Right Ear: Tympanic membrane, ear canal and external ear normal.     Left Ear: Tympanic membrane, ear canal and external ear normal.     Mouth/Throat:     Mouth: Mucous membranes are moist.     Pharynx: Oropharynx is clear.  Eyes:     Conjunctiva/sclera: Conjunctivae normal.  Cardiovascular:     Rate and Rhythm: Regular rhythm.     Heart sounds: Normal heart sounds.  Pulmonary:     Effort: Pulmonary effort is normal.     Breath sounds: Normal breath sounds.     Comments: Lungs clear to auscultation Musculoskeletal:     Cervical back: Neck supple.  Skin:    General: Skin is warm.  Neurological:     Mental Status: She is alert and oriented to person, place, and time.  Psychiatric:        Mood and Affect: Mood normal.        Behavior: Behavior normal.        Thought Content: Thought content normal.        Judgment: Judgment normal.    Diagnostics: FVC 2.31 L, FEV1 1.81.  Predicted FVC 2.56 L, predicted FEV1 1.98 L.  Spirometry indicates normal respiratory function.   Percutaneous skin testing today is positive to dust mite  with a good histamine response.  Intradermal testing today is negative.  Assessment and Plan: 1. Perennial allergic rhinitis   2. Shortness of breath     Meds ordered this encounter  Medications   budesonide (PULMICORT) 0.5 MG/2ML nebulizer solution    Sig: Use 1 unit dose twice a day as directed    Dispense:  120 mL    Refill:  3   levocetirizine (XYZAL) 5 MG tablet    Sig: Take 1 tablet (5 mg total) by mouth every evening.    Dispense:  30 tablet    Refill:  5     Patient Instructions  Allergic rhinitis (skin testing today positive to dust mite) Start avoidance measures as below Stop Benadryl Stop Zyrtec (cetirizine).  Start Xyzal 5 mg once a day as  needed for runny nose.  You can try alternating Zyrtec and Xyzal every few months to help prevent tachyphylaxis. Stop Flonase (fluticasone)  You can increase azelastine nasal spray using 2 sprays each nostril twice a day as needed for runny nose/drainage down the throat Start saline nasal rinses with Pulmicort (budesonide) 0.5 mg-1 dose twice a day. Lets see if this helps better with your head feeling full and headaches. Continue your over-the-counter eyedrops as  needed for itchy watery eyes.  Consider allergy injections as a means of long-term control. - Allergy injections "re-train" and "reset" the immune system to ignore environmental allergens and decrease the resulting immune response to those allergens (sneezing, itchy watery eyes, runny nose, nasal congestion, etc).    - Allergy injections improve symptoms in 75-85% of patients.   - We can discuss this more at the next appointment if the medications are not working for you. -Consider Odactra. I am not sure if this will be covered by your insurance.  Shortness of breath You may use albuterol 2 puffs every 4-6 hours as needed for cough, wheeze, tightness in chest, or shortness of breath.  Let us know if this treatment plan is not working well for you. Schedule a follow-up appointment in 6 weeks or sooner if needed  Control of Dust Mite Allergen Dust mites play a major role in allergic asthma and rhinitis. They occur in environments with high humidity wherever human skin is found. Dust mites absorb humidity from the atmosphere (ie, they do not drink) and feed on organic matter (including shed human and animal skin). Dust mites are a microscopic type of insect that you cannot see with the naked eye. High levels of dust mites have been detected from mattresses, pillows, carpets, upholstered furniture, bed covers, clothes, soft toys and any woven material. The principal allergen of the dust mite is found in its feces. A gram of dust may contain  1,000 mites and 250,000 fecal particles. Mite antigen is easily measured in the air during house cleaning activities. Dust mites do not bite and do not cause harm to humans, other than by triggering allergies/asthma.  Ways to decrease your exposure to dust mites in your home:  1. Encase mattresses, box springs and pillows with a mite-impermeable barrier or cover  2. Wash sheets, blankets and drapes weekly in hot water (130 F) with detergent and dry them in a dryer on the hot setting.  3. Have the room cleaned frequently with a vacuum cleaner and a damp dust-mop. For carpeting or rugs, vacuuming with a vacuum cleaner equipped with a high-efficiency particulate air (HEPA) filter. The dust mite allergic individual should not be in a room which is being cleaned and should wait 1 hour after cleaning before going into the room.  4. Do not sleep on upholstered furniture (eg, couches).  5. If possible removing carpeting, upholstered furniture and drapery from the home is ideal. Horizontal blinds should be eliminated in the rooms where the person spends the most time (bedroom, study, television room). Washable vinyl, roller-type shades are optimal.  6. Remove all non-washable stuffed toys from the bedroom. Wash stuffed toys weekly like sheets and blankets above.  7. Reduce indoor humidity to less than 50%. Inexpensive humidity monitors can be purchased at most hardware stores. Do not use a humidifier as can make the problem worse and are not recommended. Return in about 6 weeks (around 12/03/2021), or if symptoms worsen or fail to improve.    Thank you for the opportunity to care for this patient.  Please do not hesitate to contact me with questions.  Althea Charon, FNP [facility]

## 2021-10-23 ENCOUNTER — Other Ambulatory Visit: Payer: Self-pay

## 2021-10-23 MED ORDER — BUDESONIDE 0.5 MG/2ML IN SUSP: RESPIRATORY_TRACT | 3 refills | Status: DC

## 2021-10-24 ENCOUNTER — Telehealth: Payer: Self-pay

## 2021-10-24 NOTE — Telephone Encounter (Signed)
Pa submitted thru cover my meds for budesonide waiting on response from insurance

## 2021-10-25 ENCOUNTER — Telehealth: Payer: Self-pay | Admitting: Family

## 2021-10-25 NOTE — Telephone Encounter (Signed)
[employer] sent paperwork and I sent it to Wynona Canes to sign and fax back to the [employer].

## 2021-10-25 NOTE — Telephone Encounter (Signed)
[employer] Needs PA for Budesonide

## 2021-10-28 NOTE — Telephone Encounter (Signed)
Budesonide PA forms have been signed by Amada Jupiter and faxed back to [employer] on 10/28/21. Forms have been labeled and place in pending.

## 2021-10-29 ENCOUNTER — Telehealth: Payer: Self-pay | Admitting: Family

## 2021-10-29 ENCOUNTER — Telehealth: Payer: Self-pay

## 2021-10-29 NOTE — Telephone Encounter (Signed)
Spoke with patient to let her know that her pulmicort 0.5 mg was approved. Called [facility] and a verbal order was given to Alecia Lemming the pharmacist for pulmicort 0.5 mg respules use as directed. This medication will only cost her 3 dollars and 95 cents a month. Patient was thrilled with that.

## 2021-10-29 NOTE — Telephone Encounter (Signed)
[employer] has reached out stating a PA was completed and approved for Pulmicort but a budesonide PA was also completed and sent in for approval by Nehemiah Settle FNP [employer] needed to clairify which was needed Did speak with Nehemiah Settle FNP on this matter she stated either will be fine so the [employer] withdrew the Budesonide PA.

## 2021-10-29 NOTE — Telephone Encounter (Signed)
Thanks

## 2021-11-05 ENCOUNTER — Other Ambulatory Visit: Payer: Self-pay

## 2021-11-05 ENCOUNTER — Emergency Department (HOSPITAL_BASED_OUTPATIENT_CLINIC_OR_DEPARTMENT_OTHER)
Admission: EM | Admit: 2021-11-05 | Discharge: 2021-11-06 | Disposition: A | Discharge status: Home / Self Care | Payer: PPO | Attending: Emergency Medicine | Admitting: Emergency Medicine

## 2021-11-05 ENCOUNTER — Emergency Department (HOSPITAL_BASED_OUTPATIENT_CLINIC_OR_DEPARTMENT_OTHER): Payer: PPO

## 2021-11-05 ENCOUNTER — Encounter (HOSPITAL_BASED_OUTPATIENT_CLINIC_OR_DEPARTMENT_OTHER): Payer: Self-pay | Admitting: *Deleted

## 2021-11-05 DIAGNOSIS — M545 Low back pain, unspecified: Secondary | ICD-10-CM | POA: Insufficient documentation

## 2021-11-05 DIAGNOSIS — I700 Atherosclerosis of aorta: Secondary | ICD-10-CM | POA: Diagnosis not present

## 2021-11-05 DIAGNOSIS — Z96641 Presence of right artificial hip joint: Secondary | ICD-10-CM | POA: Insufficient documentation

## 2021-11-05 DIAGNOSIS — Z7982 Long term (current) use of aspirin: Secondary | ICD-10-CM | POA: Insufficient documentation

## 2021-11-05 DIAGNOSIS — R350 Frequency of micturition: Secondary | ICD-10-CM | POA: Insufficient documentation

## 2021-11-05 DIAGNOSIS — K5730 Diverticulosis of large intestine without perforation or abscess without bleeding: Secondary | ICD-10-CM | POA: Diagnosis not present

## 2021-11-05 DIAGNOSIS — M5459 Other low back pain: Secondary | ICD-10-CM | POA: Diagnosis not present

## 2021-11-05 DIAGNOSIS — M549 Dorsalgia, unspecified: Secondary | ICD-10-CM | POA: Diagnosis present

## 2021-11-05 DIAGNOSIS — R109 Unspecified abdominal pain: Secondary | ICD-10-CM | POA: Diagnosis not present

## 2021-11-05 DIAGNOSIS — K439 Ventral hernia without obstruction or gangrene: Secondary | ICD-10-CM | POA: Diagnosis not present

## 2021-11-05 DIAGNOSIS — K429 Umbilical hernia without obstruction or gangrene: Secondary | ICD-10-CM | POA: Diagnosis not present

## 2021-11-05 LAB — URINALYSIS, ROUTINE W REFLEX MICROSCOPIC
Bilirubin Urine: NEGATIVE
Glucose, UA: NEGATIVE mg/dL
Ketones, ur: 15 mg/dL — AB
Leukocytes,Ua: NEGATIVE
Nitrite: NEGATIVE
Protein, ur: NEGATIVE mg/dL
Specific Gravity, Urine: 1.015 (ref 1.005–1.030)
pH: 5.5 (ref 5.0–8.0)

## 2021-11-05 LAB — CBC WITH DIFFERENTIAL/PLATELET
Abs Immature Granulocytes: 0.02 10*3/uL (ref 0.00–0.07)
Basophils Absolute: 0.1 10*3/uL (ref 0.0–0.1)
Basophils Relative: 1 %
Eosinophils Absolute: 0.1 10*3/uL (ref 0.0–0.5)
Eosinophils Relative: 1 %
HCT: 43.8 % (ref 36.0–46.0)
Hemoglobin: 14.8 g/dL (ref 12.0–15.0)
Immature Granulocytes: 0 %
Lymphocytes Relative: 23 %
Lymphs Abs: 1.6 10*3/uL (ref 0.7–4.0)
MCH: 31.5 pg (ref 26.0–34.0)
MCHC: 33.8 g/dL (ref 30.0–36.0)
MCV: 93.2 fL (ref 80.0–100.0)
Monocytes Absolute: 0.6 10*3/uL (ref 0.1–1.0)
Monocytes Relative: 9 %
Neutro Abs: 4.6 10*3/uL (ref 1.7–7.7)
Neutrophils Relative %: 66 %
Platelets: 411 10*3/uL — ABNORMAL HIGH (ref 150–400)
RBC: 4.7 MIL/uL (ref 3.87–5.11)
RDW: 12.1 % (ref 11.5–15.5)
WBC: 7 10*3/uL (ref 4.0–10.5)
nRBC: 0 % (ref 0.0–0.2)

## 2021-11-05 LAB — URINALYSIS, MICROSCOPIC (REFLEX)

## 2021-11-05 NOTE — ED Triage Notes (Signed)
Back pain since last night. No injury.

## 2021-11-05 NOTE — ED Notes (Signed)
Patient transported to CT 

## 2021-11-05 NOTE — ED Provider Notes (Addendum)
[facility] [place] [facility] DEPART[facility] Provider Note   CSN: [id number] Arrival date & time: 11/05/21  1827     History Chief Complaint  Patient presents with   Back Pain    Tracey Morgan is a 75 y.o. female with a past medical history significant for back pain, kidney stones, hip surgery, previous back surgery who presents with back pain since last night.  Patient reports that she had some back pain radiating to her legs that began last night, believe that was muscular in nature, awoke in the morning with pain mostly resolved, however she has been back and forth to the bathroom all day in preparation for colonoscopy tomorrow so back pain began again.  Patient is concerned for potential for kidney stone as she has known history of kidney stones, and does report some feeling of incomplete bladder emptying, urinary frequency without dysuria, gross hematuria, vaginal bleeding, vaginal discharge at this time.  Denies history of cancer, IV drug use, she does report that she has been on multiple courses of steroids in the past given complicated back history, and cannot tolerate steroids at this time due to worsening of her diabetes, blood pressure.  Patient denies any numbness, tingling, saddle anesthesia, difficulty with defecation, difficulty with walking other than pain.  Patient is taking Tylenol at this time with minimal relief.  Patient reports the pain is 7/10 at this time.  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     Family History  Problem Relation Age of Onset   Angioedema Father    Eczema Sister    Immunodeficiency Sister    Food Allergy Niece     Social History   Tobacco Use   Smoking status: Never   Smokeless tobacco: Never  Vaping Use   Vaping Use: Never used  Substance Use Topics   Alcohol use: No   Drug use: No    Home Medications Prior to Admission medications   Medication Sig Start Date End Date Taking? Authorizing Provider  albuterol (VENTOLIN HFA) 108 (90 Base) MCG/ACT inhaler Inhale 2 puffs into the lungs every 6 (six) hours as needed for wheezing or shortness of breath. 09/05/21   Althea Charon, FNP  alum & mag hydroxide-simeth (MAALOX/MYLANTA)  200-200-20 MG/5ML suspension Take 30 mLs by mouth every 6 (six) hours as needed for indigestion or heartburn.    [provider]  Aspirin-Acetaminophen-Caffeine (EXCEDRIN MIGRAINE PO) Take by mouth.    [provider]  azelastine (ASTELIN) 0.1 % nasal spray Place 1 spray into both nostrils 2 (two) times daily. Use in each nostril as directed 09/05/21   Althea Charon, FNP  budesonide (PULMICORT) 0.5 MG/2ML nebulizer solution Use 1 unit dose twice a day as directed 10/23/21   Althea Charon, FNP  Cholecalciferol (VITAMIN D) 50 MCG (2000 UT) tablet Take 50,000 Units by mouth once a week.    [provider]  Cinnamon 500 MG capsule Take 500  mg by mouth in the morning, at noon, and at bedtime.    [provider]  clobetasol ointment (TEMOVATE) 0.05 % Apply topically 2 (two) times daily as needed. 05/15/21   [provider]  diphenhydrAMINE (BENADRYL) 25 MG tablet Take 25 mg by mouth at bedtime.    [provider]  fluticasone (FLONASE) 50 MCG/ACT nasal spray Place 2 sprays into both nostrils daily. 09/05/21   Althea Charon, FNP  guaiFENesin (MUCINEX) 600 MG 12 hr tablet Take 600 mg by mouth 2 (two) times daily as needed for to loosen phlegm or cough.    [provider]  hydrochlorothiazide (HYDRODIURIL) 12.5 MG tablet Take 12.5 mg by mouth 3 (three) times a week.    [provider]  ketotifen (ZADITOR) 0.025 % ophthalmic solution Place 1 drop into both eyes 2 (two) times daily as needed (eye allergies).    [provider]  levocetirizine (XYZAL) 5 MG tablet Take 1 tablet (5 mg total) by mouth every evening. 10/22/21   Althea Charon, FNP  Magnesium 250 MG TABS Take 250 mg by mouth daily.    [provider]  meclizine (ANTIVERT) 25 MG tablet Take 25 mg by mouth 3 (three) times daily as needed for dizziness.    [provider]  Multiple Vitamins-Minerals (PRESERVISION AREDS 2+MULTI VIT PO) Take 1 capsule by mouth in the morning and at bedtime.    [provider]  promethazine-dextromethorphan (PROMETHAZINE-DM) 6.25-15 MG/5ML syrup Take 5 mLs by mouth 4 (four) times daily as needed. 10/12/21   [provider]  ZINC-VITAMIN C PO Take 1 tablet by mouth daily.    [provider]    Allergies    Hydralazine, Metformin, Metronidazole, Percocet [oxycodone-acetaminophen], Buprenorphine hcl, Codeine, Hydrocodone, and Morphine and related  Review of Systems   Review of Systems  Genitourinary:  Positive for frequency and urgency. Negative for dysuria and hematuria.  Musculoskeletal:  Positive for back pain.  All other systems reviewed and are  negative.  Physical Exam Updated Vital Signs BP (!) 160/71   Pulse 93   Temp 98.4 F (36.9 C) (Oral)   Resp 18   Ht 5\' 1"  (1.549 m)   Wt 88.2 kg   SpO2 98%   BMI 36.74 kg/m   Physical Exam Vitals and nursing note reviewed.  Constitutional:      General: She is not in acute distress.    Appearance: Normal appearance.  HENT:     Head: Normocephalic and atraumatic.  Eyes:     General:        Right eye: No discharge.        Left eye: No discharge.  Cardiovascular:     Rate and Rhythm: Normal rate and regular rhythm.     Pulses: Normal pulses.  Heart sounds: No murmur heard.   No friction rub. No gallop.  Pulmonary:     Effort: Pulmonary effort is normal.     Breath sounds: Normal breath sounds.  Abdominal:     General: Bowel sounds are normal.     Palpations: Abdomen is soft.  Musculoskeletal:     Comments: Intact strength 5 out of 5 bilateral lower extremities.  Patient has no midline spinal tenderness, however she does have some tenderness palpation of the right lumbar paraspinous muscles.  Patient with intact gait.  Skin:    General: Skin is warm and dry.     Capillary Refill: Capillary refill takes less than 2 seconds.  Neurological:     Mental Status: She is alert and oriented to person, place, and time.  Psychiatric:        Mood and Affect: Mood normal.        Behavior: Behavior normal.    ED Results / Procedures / Treatments   Labs (all labs ordered are listed, but only abnormal results are displayed) Labs Reviewed  URINALYSIS, ROUTINE W REFLEX MICROSCOPIC - Abnormal; Notable for the following components:      Result Value   Hgb urine dipstick SMALL (*)    Ketones, ur 15 (*)    All other components within normal limits  URINALYSIS, MICROSCOPIC (REFLEX) - Abnormal; Notable for the following components:   Bacteria, UA FEW (*)    All other components within normal limits  CBC WITH DIFFERENTIAL/PLATELET - Abnormal; Notable for the following components:    Platelets 411 (*)    All other components within normal limits  BASIC METABOLIC PANEL - Abnormal; Notable for the following components:   Potassium 3.3 (*)    Glucose, Bld 124 (*)    All other components within normal limits    EKG None  Radiology CT Renal Stone Study  Result Date: 11/05/2021 CLINICAL DATA:  Flank pain. EXAM: CT ABDOMEN AND PELVIS WITHOUT CONTRAST TECHNIQUE: Multidetector CT imaging of the abdomen and pelvis was performed following the standard protocol without IV contrast. COMPARISON:  CT abdomen and pelvis 05/06/2019. FINDINGS: Lower chest: No acute abnormality. Hepatobiliary: No focal liver abnormality is seen. Status post cholecystectomy. No biliary dilatation. Pancreas: Unremarkable. No pancreatic ductal dilatation or surrounding inflammatory changes. Spleen: Normal in size without focal abnormality. Adrenals/Urinary Tract: Adrenal glands are unremarkable. Kidneys are normal, without renal calculi, focal lesion, or hydronephrosis. Bladder is unremarkable. Stomach/Bowel: There has been partial colonic resection distally. Anastomosis is within normal limits. There are no dilated bowel loops. There is no wall thickening or focal inflammation. There are scattered colonic diverticula without evidence for acute diverticulitis. The appendix is not visualized. Small bowel and stomach appear within normal limits. Vascular/Lymphatic: Aortic atherosclerosis. No enlarged abdominal or pelvic lymph nodes. Reproductive: Uterus and bilateral adnexa are unremarkable. Other: There is a small fat containing umbilical hernia and a small fat containing infraumbilical ventral hernia. There is no ascites or free air. Musculoskeletal: Stable compression deformity of L2. Multilevel degenerative changes affect the spine. There is a new right hip arthroplasty in anatomic alignment. IMPRESSION: 1. No acute localizing process in the abdomen or pelvis. 2. Fat containing umbilical and ventral hernias. 3.  Partial colonic resection. Colonic diverticulosis without evidence for diverticulitis. 4.  Aortic Atherosclerosis (ICD10-I70.0). Electronically Signed   By: Ronney Asters M.D.   On: 11/05/2021 23:47    Procedures Procedures   Medications Ordered in ED Medications - No data to display  ED Course  I  have reviewed the triage vital signs and the nursing notes.  Pertinent labs & imaging results that were available during my care of the patient were reviewed by me and considered in my medical decision making (see chart for details).    MDM Rules/Calculators/A&P                         I discussed this case with my attending physician who cosigned this note including patient's presenting symptoms, physical exam, and planned diagnostics and interventions. Attending physician stated agreement with plan or made changes to plan which were implemented.   Overall well-appearing patient with what appears to be presentation of acute on chronic back pain.  Patient with right-sided lumbar paraspinous muscle pain without sciatica.  Patient has concern for potential for kidney stones given her history of kidney stones, and some urinary frequency and feeling of incomplete bladder emptying.  Patient has been able to urinate, however feels that she is incompletely emptying her bladder.  Was recently on antibiotics for different infection, denies any fever, chills, infectious symptoms at this time.  Will obtain CT renal stone study to evaluate for potential kidney stones in context of small urine hemoglobin.  Will obtain basic lab work to evaluate for kidney function.  CT stone study shows no evidence of stones.  Do believe that this pain is musculoskeletal in nature.  Lab work unremarkable, kidney function stable.  Patient discharged with plan for pain control, follow-up with orthopedics.  She understands agrees to plan.  Final Clinical Impression(s) / ED Diagnoses Final diagnoses:  Acute right-sided low back pain  without sciatica    Rx / DC Orders ED Discharge Orders     None        Olene Floss, PA-C 11/06/21 0023    Olene Floss, PA-C 11/06/21 Pearla Dubonnet, MD 11/06/21 1228

## 2021-11-06 LAB — BASIC METABOLIC PANEL
Anion gap: 8 (ref 5–15)
BUN: 11 mg/dL (ref 8–23)
CO2: 25 mmol/L (ref 22–32)
Calcium: 9.9 mg/dL (ref 8.9–10.3)
Chloride: 102 mmol/L (ref 98–111)
Creatinine, Ser: 0.73 mg/dL (ref 0.44–1.00)
GFR, Estimated: >60 mL/min (ref >60)
Glucose, Bld: 124 mg/dL — ABNORMAL HIGH (ref 70–99)
Potassium: 3.3 mmol/L — ABNORMAL LOW (ref 3.5–5.1)
Sodium: 135 mmol/L (ref 135–145)

## 2021-11-06 NOTE — Discharge Instructions (Signed)
Please use Tylenol or ibuprofen for pain.  You may use 600 mg ibuprofen every 6 hours or 1000 mg of Tylenol every 6 hours.  You may choose to alternate between the 2.  This would be most effective.  Not to exceed 4 g of Tylenol within 24 hours.  Not to exceed 3200 mg ibuprofen 24 hours.  You can use your at home tramadol and flexeril as needed for breakthrough pain. I recommend follow up with orthopedics if the pain persists despite rest, treatment.

## 2021-11-07 DIAGNOSIS — Z8679 Personal history of other diseases of the circulatory system: Secondary | ICD-10-CM | POA: Diagnosis not present

## 2021-11-07 DIAGNOSIS — Z79899 Other long term (current) drug therapy: Secondary | ICD-10-CM | POA: Diagnosis not present

## 2021-11-07 DIAGNOSIS — E669 Obesity, unspecified: Secondary | ICD-10-CM | POA: Diagnosis not present

## 2021-11-07 DIAGNOSIS — I6522 Occlusion and stenosis of left carotid artery: Secondary | ICD-10-CM | POA: Diagnosis not present

## 2021-11-07 DIAGNOSIS — E041 Nontoxic single thyroid nodule: Secondary | ICD-10-CM | POA: Diagnosis not present

## 2021-11-07 DIAGNOSIS — R7302 Impaired glucose tolerance (oral): Secondary | ICD-10-CM | POA: Diagnosis not present

## 2021-11-07 DIAGNOSIS — D539 Nutritional anemia, unspecified: Secondary | ICD-10-CM | POA: Diagnosis not present

## 2021-11-07 DIAGNOSIS — Z6835 Body mass index (BMI) 35.0-35.9, adult: Secondary | ICD-10-CM | POA: Diagnosis not present

## 2021-11-07 DIAGNOSIS — Z20822 Contact with and (suspected) exposure to covid-19: Secondary | ICD-10-CM | POA: Diagnosis not present

## 2021-11-07 DIAGNOSIS — E559 Vitamin D deficiency, unspecified: Secondary | ICD-10-CM | POA: Diagnosis not present

## 2021-11-07 DIAGNOSIS — R300 Dysuria: Secondary | ICD-10-CM | POA: Diagnosis not present

## 2021-11-07 DIAGNOSIS — I4510 Unspecified right bundle-branch block: Secondary | ICD-10-CM | POA: Diagnosis not present

## 2021-11-07 DIAGNOSIS — E782 Mixed hyperlipidemia: Secondary | ICD-10-CM | POA: Diagnosis not present

## 2021-11-11 DIAGNOSIS — H524 Presbyopia: Secondary | ICD-10-CM | POA: Diagnosis not present

## 2021-11-11 DIAGNOSIS — H52203 Unspecified astigmatism, bilateral: Secondary | ICD-10-CM | POA: Diagnosis not present

## 2021-11-11 DIAGNOSIS — E119 Type 2 diabetes mellitus without complications: Secondary | ICD-10-CM | POA: Diagnosis not present

## 2021-11-11 DIAGNOSIS — H5212 Myopia, left eye: Secondary | ICD-10-CM | POA: Diagnosis not present

## 2021-11-11 DIAGNOSIS — H25013 Cortical age-related cataract, bilateral: Secondary | ICD-10-CM | POA: Diagnosis not present

## 2021-11-11 DIAGNOSIS — H43393 Other vitreous opacities, bilateral: Secondary | ICD-10-CM | POA: Diagnosis not present

## 2021-11-11 DIAGNOSIS — H5201 Hypermetropia, right eye: Secondary | ICD-10-CM | POA: Diagnosis not present

## 2021-11-11 DIAGNOSIS — H25043 Posterior subcapsular polar age-related cataract, bilateral: Secondary | ICD-10-CM | POA: Diagnosis not present

## 2021-11-11 DIAGNOSIS — H2513 Age-related nuclear cataract, bilateral: Secondary | ICD-10-CM | POA: Diagnosis not present

## 2021-11-13 DIAGNOSIS — R350 Frequency of micturition: Secondary | ICD-10-CM | POA: Diagnosis not present

## 2021-11-13 DIAGNOSIS — R351 Nocturia: Secondary | ICD-10-CM | POA: Diagnosis not present

## 2021-11-13 DIAGNOSIS — R3121 Asymptomatic microscopic hematuria: Secondary | ICD-10-CM | POA: Diagnosis not present

## 2021-11-14 DIAGNOSIS — M545 Low back pain, unspecified: Secondary | ICD-10-CM | POA: Diagnosis not present

## 2021-11-14 DIAGNOSIS — M5136 Other intervertebral disc degeneration, lumbar region: Secondary | ICD-10-CM | POA: Diagnosis not present

## 2021-11-15 DIAGNOSIS — E042 Nontoxic multinodular goiter: Secondary | ICD-10-CM | POA: Diagnosis not present

## 2021-11-15 DIAGNOSIS — E559 Vitamin D deficiency, unspecified: Secondary | ICD-10-CM | POA: Diagnosis not present

## 2021-11-20 DIAGNOSIS — Z6835 Body mass index (BMI) 35.0-35.9, adult: Secondary | ICD-10-CM | POA: Diagnosis not present

## 2021-11-20 DIAGNOSIS — E042 Nontoxic multinodular goiter: Secondary | ICD-10-CM | POA: Diagnosis not present

## 2021-11-20 DIAGNOSIS — K5792 Diverticulitis of intestine, part unspecified, without perforation or abscess without bleeding: Secondary | ICD-10-CM | POA: Diagnosis not present

## 2021-11-20 DIAGNOSIS — R7302 Impaired glucose tolerance (oral): Secondary | ICD-10-CM | POA: Diagnosis not present

## 2021-11-20 DIAGNOSIS — K5190 Ulcerative colitis, unspecified, without complications: Secondary | ICD-10-CM | POA: Diagnosis not present

## 2021-11-20 DIAGNOSIS — E559 Vitamin D deficiency, unspecified: Secondary | ICD-10-CM | POA: Diagnosis not present

## 2021-11-27 DIAGNOSIS — L308 Other specified dermatitis: Secondary | ICD-10-CM | POA: Diagnosis not present

## 2021-11-27 DIAGNOSIS — L821 Other seborrheic keratosis: Secondary | ICD-10-CM | POA: Diagnosis not present

## 2021-12-03 NOTE — Patient Instructions (Addendum)
Allergic rhinitis (skin testing 10/22/21 positive to dust mite) Continue avoidance measures as below  Continue Xyzal 5 mg once a day as needed for runny nose.  You can try alternating Zyrtec and Xyzal every few months to help prevent tachyphylaxis. Continue  azelastine nasal spray using 2 sprays each nostril twice a day as needed for runny nose/drainage down the throat Continue saline nasal rinses with Pulmicort (budesonide) 0.5 mg-1 dose twice a day.  Hold off on using this for a few days due to was seen blood when you blow your nose. Continue your over-the-counter eyedrops as needed for itchy watery eyes.  Consider allergy injections as a means of long-term control. - Allergy injections "re-train" and "reset" the immune system to ignore environmental allergens and decrease the resulting immune response to those allergens (sneezing, itchy watery eyes, runny nose, nasal congestion, etc).    - Allergy injections improve symptoms in 75-85% of patients.   -CPT code given to start allergy injections.  After calling your insurance to find out cost please call our office and let us know if you are interested in starting allergy injections. - we cannot consider Isaiah Serge due to age restrictions of 19-49 years old  Shortness of breath You may use albuterol 2 puffs every 4-6 hours as needed for cough, wheeze, tightness in chest, or shortness of breath.  Continue to follow-up with with cardiology   Let us know if this treatment plan is not working well for you. Schedule a follow-up appointment in 3 months or sooner if needed  Control of Dust Mite Allergen Dust mites play a major role in allergic asthma and rhinitis. They occur in environments with high humidity wherever human skin is found. Dust mites absorb humidity from the atmosphere (ie, they do not drink) and feed on organic matter (including shed human and animal skin). Dust mites are a microscopic type of insect that you cannot see with the naked  eye. High levels of dust mites have been detected from mattresses, pillows, carpets, upholstered furniture, bed covers, clothes, soft toys and any woven material. The principal allergen of the dust mite is found in its feces. A gram of dust may contain 1,000 mites and 250,000 fecal particles. Mite antigen is easily measured in the air during house cleaning activities. Dust mites do not bite and do not cause harm to humans, other than by triggering allergies/asthma.  Ways to decrease your exposure to dust mites in your home:  1. Encase mattresses, box springs and pillows with a mite-impermeable barrier or cover  2. Wash sheets, blankets and drapes weekly in hot water (130 F) with detergent and dry them in a dryer on the hot setting.  3. Have the room cleaned frequently with a vacuum cleaner and a damp dust-mop. For carpeting or rugs, vacuuming with a vacuum cleaner equipped with a high-efficiency particulate air (HEPA) filter. The dust mite allergic individual should not be in a room which is being cleaned and should wait 1 hour after cleaning before going into the room.  4. Do not sleep on upholstered furniture (eg, couches).  5. If possible removing carpeting, upholstered furniture and drapery from the home is ideal. Horizontal blinds should be eliminated in the rooms where the person spends the most time (bedroom, study, television room). Washable vinyl, roller-type shades are optimal.  6. Remove all non-washable stuffed toys from the bedroom. Wash stuffed toys weekly like sheets and blankets above.  7. Reduce indoor humidity to less than 50%. Inexpensive humidity monitors  can be purchased at most hardware stores. Do not use a humidifier as can make the problem worse and are not recommended.

## 2021-12-05 ENCOUNTER — Ambulatory Visit (INDEPENDENT_AMBULATORY_CARE_PROVIDER_SITE_OTHER): Payer: PPO | Admitting: Family

## 2021-12-05 ENCOUNTER — Other Ambulatory Visit: Payer: Self-pay

## 2021-12-05 ENCOUNTER — Other Ambulatory Visit: Payer: Self-pay | Admitting: Internal Medicine

## 2021-12-05 ENCOUNTER — Encounter: Payer: Self-pay | Admitting: Family

## 2021-12-05 VITALS — BP 118/70 | HR 78 | Temp 97.7°F | Resp 18

## 2021-12-05 DIAGNOSIS — R0602 Shortness of breath: Secondary | ICD-10-CM | POA: Diagnosis not present

## 2021-12-05 DIAGNOSIS — J3089 Other allergic rhinitis: Secondary | ICD-10-CM

## 2021-12-05 MED ORDER — EPINEPHRINE 0.3 MG/0.3ML IJ SOAJ: 0.3000 mg | INTRAMUSCULAR | 1 refills | Status: DC | PRN

## 2021-12-05 NOTE — Progress Notes (Signed)
[address] [place] [address] Dept: [phone]  FOLLOW UP NOTE  Patient ID: Tracey Morgan, female    DOB: 1946-01-17  Age: 75 y.o. MRN: [id number] Date of Office Visit: 12/05/2021  Assessment  Chief Complaint: Allergic Rhinitis  and Nasal Congestion  HPI Tracey Morgan is a 75 year old female who presents today for follow-up of perennial allergic rhinitis and shortness of breath.  She was last seen October 22, 2021 by Nehemiah Settle, FNP.  Since her last office visit she reports that she went to the emergency room on November 29 for acute right-sided low back pain without sciatica.  Perennial allergic rhinitis is reported as doing 50% better with Xyzal 5 mg at night, azelastine nasal spray 2 sprays each nostril twice a day, and saline rinses with Pulmicort 0.5 mg twice a day.  She reports since starting this regimen that her symptoms are approximately 50% better.  She does mention that she has sneezing that occurs mostly when she is dusting.  She does also mention that she has nasal congestion that was a lot better until she started cleaning yesterday.  She also has postnasal drip but it is not as bad, but still occurs in the morning.  She has seen some blood when she blows her nose for the past few days, but otherwise denies rhinorrhea..  She has not had any sinus infections since we last saw her.  She is interested in starting allergy injections.  She reports that she has done allergy injections in the 1980s  and that they helped.  Shortness of breath and wheezing is reported as moderately controlled with albuterol as needed and hydrochlorothiazide as needed.  She reports that when she feels short of breath or has wheezing she feels like it is more from fluid buildup from eating more salt over the holidays.  When she takes hydrochlorothiazide this helps with her symptoms.  She denies tightness in her chest.  She also mentions cough from drainage.  She has not used albuterol because she  does not feel like her symptoms come from her chest.  She does see her cardiologist Dr. Desma Maxim.  She did mention that her mom had congestive heart failure, but she has not been diagnosed with this.  She will at times notice swelling in her hands and legs and this is when she will take hydrochlorothiazide.   Drug Allergies:  Allergies  Allergen Reactions   Hydralazine Anaphylaxis and Nausea Only    Patient had nausea and headache with po hydralazine 10mg     Metformin Other (See Comments)    Severe colitis.   Metronidazole Other (See Comments)    Not specified   Percocet [Oxycodone-Acetaminophen]     Pt is unsure    Buprenorphine Hcl Nausea And Vomiting, Rash and Other (See Comments)    hallucinations    Codeine Nausea And Vomiting, Rash and Other (See Comments)    Hallucinations   Hydrocodone Nausea And Vomiting and Rash   Morphine And Related Nausea And Vomiting, Rash and Other (See Comments)    hallucinations    Review of Systems: Review of Systems  Constitutional:  Negative for chills and fever.  HENT:         Reports nasal congestion, sneezing, and postnasal drip.  Denies rhinorrhea  Eyes:        Denies itchy watery eyes  Respiratory:  Positive for cough, shortness of breath and wheezing.        Reports shortness of breath  and wheezing -which she feels like is from fluid buildup from eating more salt.  She will take her hydrochlorothiazide and this helps.  She also reports cough due to postnasal drip  Cardiovascular:  Positive for leg swelling.       Reports bilateral swelling of legs and hands when she eats too much salt.  She will take hydrochlorothiazide as needed  Skin:  Negative for itching and rash.  Endo/Heme/Allergies:  Positive for environmental allergies.    Physical Exam: BP 118/70 (BP Location: Right Arm, Patient Position: Sitting, Cuff Size: Large)    Pulse 78    Temp 97.7 F (36.5 C) (Temporal)    Resp 18    SpO2 98%    Physical Exam Constitutional:       Appearance: Normal appearance.  HENT:     Head: Normocephalic and atraumatic.     Comments: Pharynx normal, eyes normal, ears normal, nose: Bilateral lower turbinates moderately edematous and slightly erythematous with clear drainage noted    Right Ear: Tympanic membrane, ear canal and external ear normal.     Left Ear: Tympanic membrane, ear canal and external ear normal.     Mouth/Throat:     Mouth: Mucous membranes are moist.     Pharynx: Oropharynx is clear.  Cardiovascular:     Rate and Rhythm: Regular rhythm.     Heart sounds: Normal heart sounds.  Pulmonary:     Effort: Pulmonary effort is normal.     Breath sounds: Normal breath sounds.     Comments: Lungs clear to auscultation Musculoskeletal:     Cervical back: Neck supple.  Skin:    General: Skin is warm.     Comments: 1+ pitting edema noted in bilateral legs  Neurological:     Mental Status: She is alert and oriented to person, place, and time.  Psychiatric:        Mood and Affect: Mood normal.        Behavior: Behavior normal.        Thought Content: Thought content normal.        Judgment: Judgment normal.    Diagnostics: FVC 2.36 L, FEV1 1.78 L(90%).  Predicted FVC 2.56 L, predicted FEV1 1.98 L.  Spirometry indicates normal respiratory function  Assessment and Plan: 1. Perennial allergic rhinitis   2. Shortness of breath     No orders of the defined types were placed in this encounter.   Patient Instructions  Allergic rhinitis (skin testing 10/22/21 positive to dust mite) Continue avoidance measures as below  Continue Xyzal 5 mg once a day as needed for runny nose.  You can try alternating Zyrtec and Xyzal every few months to help prevent tachyphylaxis. Continue  azelastine nasal spray using 2 sprays each nostril twice a day as needed for runny nose/drainage down the throat Continue saline nasal rinses with Pulmicort (budesonide) 0.5 mg-1 dose twice a day.  Hold off on using this for a few days due to was  seen blood when you blow your nose. Continue your over-the-counter eyedrops as needed for itchy watery eyes.  Consider allergy injections as a means of long-term control. - Allergy injections "re-train" and "reset" the immune system to ignore environmental allergens and decrease the resulting immune response to those allergens (sneezing, itchy watery eyes, runny nose, nasal congestion, etc).    - Allergy injections improve symptoms in 75-85% of patients.   -CPT code given to start allergy injections.  After calling your insurance to find out cost please  call our office and let us know if you are interested in starting allergy injections. - we cannot consider Kathlene November due to age restrictions of 84-23 years old  Shortness of breath You may use albuterol 2 puffs every 4-6 hours as needed for cough, wheeze, tightness in chest, or shortness of breath.  Continue to follow-up with with cardiology   Let us know if this treatment plan is not working well for you. Schedule a follow-up appointment in 3 months or sooner if needed  Control of Dust Mite Allergen Dust mites play a major role in allergic asthma and rhinitis. They occur in environments with high humidity wherever human skin is found. Dust mites absorb humidity from the atmosphere (ie, they do not drink) and feed on organic matter (including shed human and animal skin). Dust mites are a microscopic type of insect that you cannot see with the naked eye. High levels of dust mites have been detected from mattresses, pillows, carpets, upholstered furniture, bed covers, clothes, soft toys and any woven material. The principal allergen of the dust mite is found in its feces. A gram of dust may contain 1,000 mites and 250,000 fecal particles. Mite antigen is easily measured in the air during house cleaning activities. Dust mites do not bite and do not cause harm to humans, other than by triggering allergies/asthma.  Ways to decrease your exposure to dust  mites in your home:  1. Encase mattresses, box springs and pillows with a mite-impermeable barrier or cover  2. Wash sheets, blankets and drapes weekly in hot water (130 F) with detergent and dry them in a dryer on the hot setting.  3. Have the room cleaned frequently with a vacuum cleaner and a damp dust-mop. For carpeting or rugs, vacuuming with a vacuum cleaner equipped with a high-efficiency particulate air (HEPA) filter. The dust mite allergic individual should not be in a room which is being cleaned and should wait 1 hour after cleaning before going into the room.  4. Do not sleep on upholstered furniture (eg, couches).  5. If possible removing carpeting, upholstered furniture and drapery from the home is ideal. Horizontal blinds should be eliminated in the rooms where the person spends the most time (bedroom, study, television room). Washable vinyl, roller-type shades are optimal.  6. Remove all non-washable stuffed toys from the bedroom. Wash stuffed toys weekly like sheets and blankets above.  7. Reduce indoor humidity to less than 50%. Inexpensive humidity monitors can be purchased at most hardware stores. Do not use a humidifier as can make the problem worse and are not recommended.   Return in about 3 months (around 03/05/2022), or if symptoms worsen or fail to improve.    Thank you for the opportunity to care for this patient.  Please do not hesitate to contact me with questions.  Althea Charon, FNP [facility]

## 2021-12-05 NOTE — Progress Notes (Signed)
Encounter created to place immunotherapy order.

## 2021-12-09 NOTE — Progress Notes (Signed)
VIALS NOT MADE DUE TO NO APPT MADE.

## 2021-12-09 NOTE — Progress Notes (Signed)
Aeroallergen Immunotherapy   Ordering Provider: Dr. Sigurd Sos   Patient Details  Name: Tracey Morgan  MRN: [id number]  Date of Birth: 03/14/1946   Order 1 of 1   Vial Label: DM   0.5 ml (Volume)   AU Concentration -- Mite Mix (DF 5,000 & DP 5,000)    0.5  ml Extract Subtotal  4.5  ml Diluent  5.0  ml Maintenance Total   Schedule:  B  Blue Vial (1:100,000): Schedule B (6 doses)  Yellow Vial (1:10,000): Schedule B (6 doses)  Green Vial (1:1,000): Schedule B (6 doses)  Red Vial (1:100): Schedule A (10 doses)   Special Instructions: normal B schedule

## 2021-12-10 IMAGING — MR MR LUMBAR SPINE W/O CM
4 of 5 series · 29 of 48 positions shown · non-contrast
Comparison: CT abdomen pelvis 05/06/2019.  Lumbar MRI 05/04/2015
COMPARISON: CT abdomen pelvis 05/06/2019.  Lumbar MRI 05/04/2015

Addendum:
CLINICAL DATA: Low back pain with right leg pain. History of back
surgery.

EXAM:
MRI LUMBAR SPINE WITHOUT CONTRAST
TECHNIQUE: Multiplanar, multisequence MR imaging of the lumbar spine was
performed. No intravenous contrast was administered.

[Series 5: T2 · sagittal · 4.0mm · 0.81mm/px · 6 of 17 slices shown (1 of 2)]
[im 1/17]
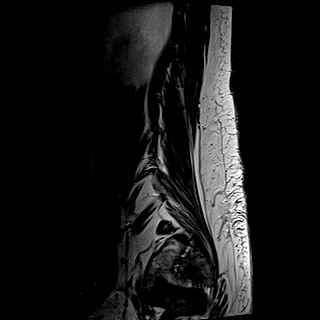
[im 4/17]
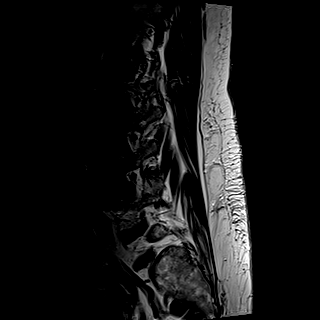
[im 7/17]
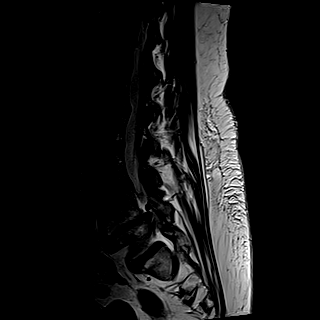
[im 10/17]
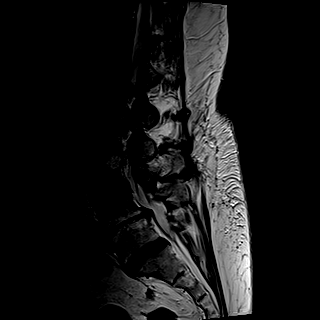
[im 13/17]
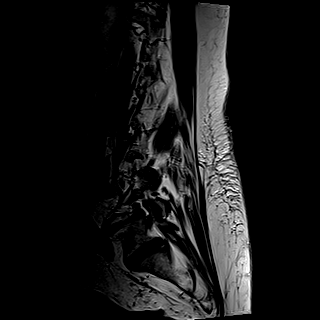
[im 17/17]
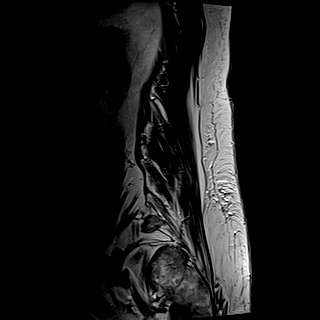

[Series 6: T1 · sagittal · 4.0mm · 0.81mm/px · 6 of 17 slices shown (1 of 2)]
[im 1/17]
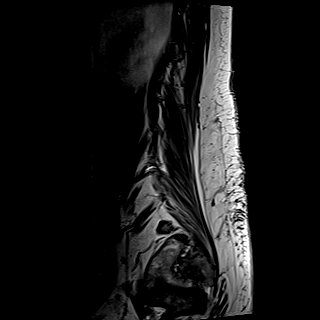
[im 4/17]
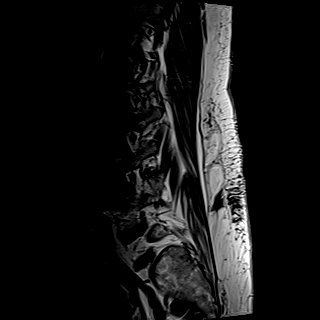
[im 7/17]
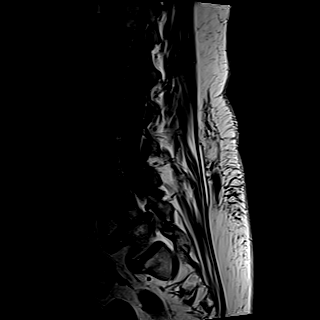
[im 10/17]
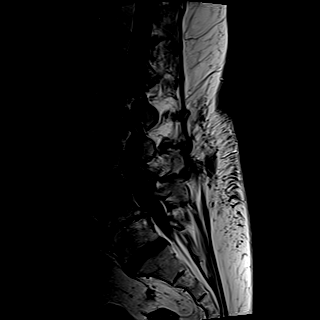
[im 13/17]
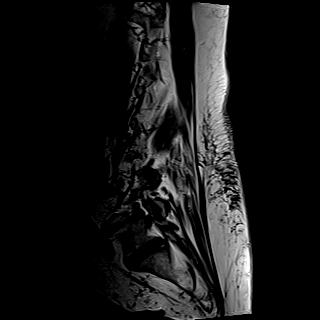
[im 17/17]
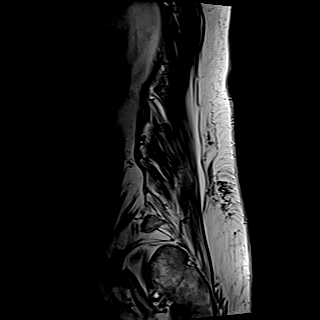

[Series 8: T2 · axial · 4.0mm · 0.62mm/px · z∈[-53,+171]mm · 9 of 39 slices shown (2 of 2)]
[im 1/39]
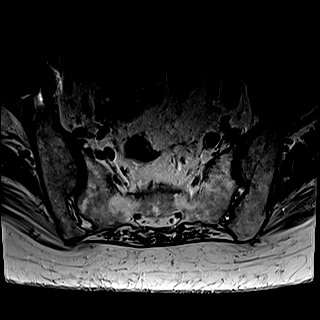
[im 6/39]
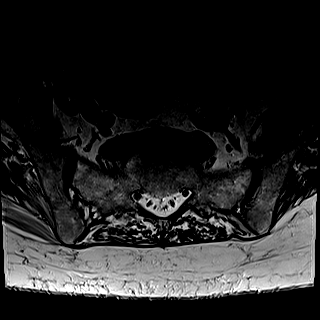
[im 11/39]
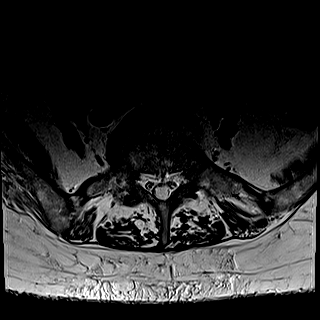
[im 17/39]
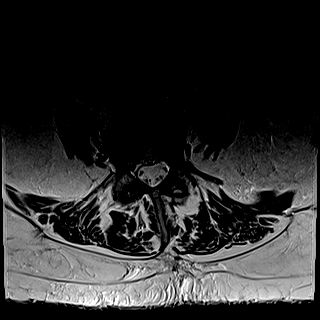
[im 20/39]
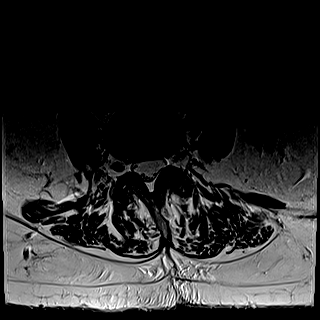
[im 22/39]
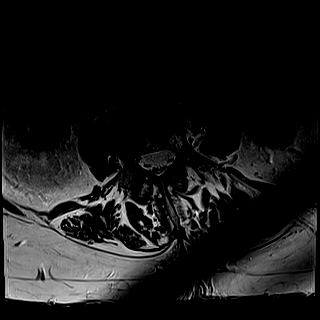
[im 28/39]
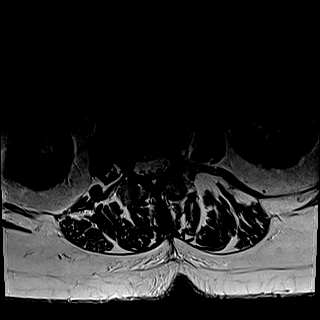
[im 33/39]
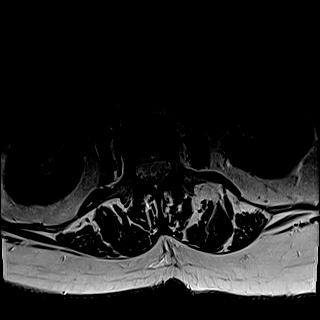
[im 39/39]
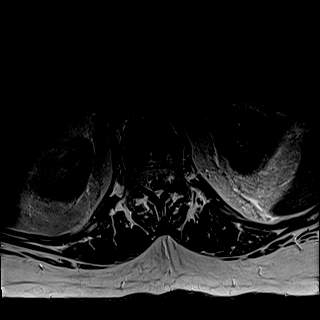

[Series 9: T1 · axial · 4.0mm · 0.39mm/px · z∈[-53,+141]mm · 8 of 39 slices shown (2 of 2)]
[im 1/39]
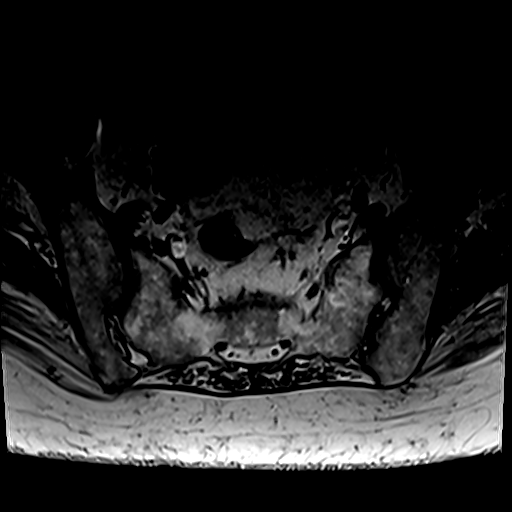
[im 6/39]
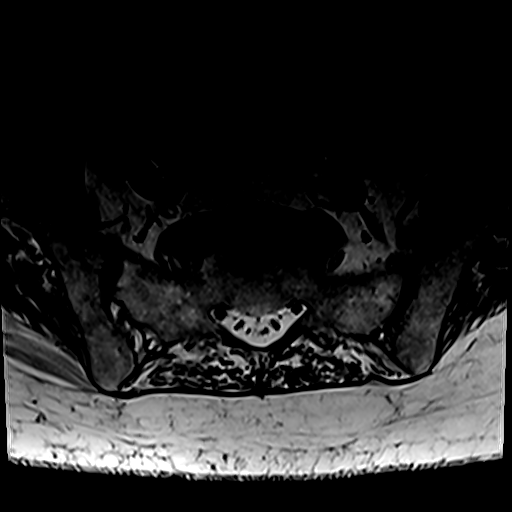
[im 11/39]
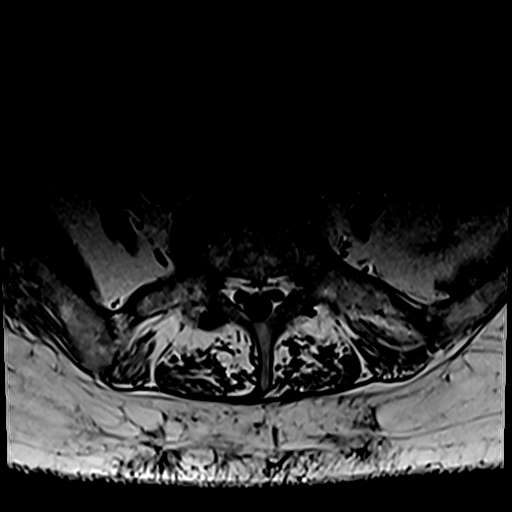
[im 17/39]
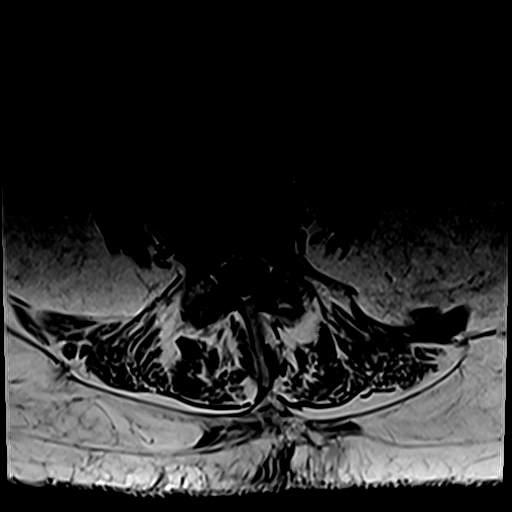
[im 20/39]
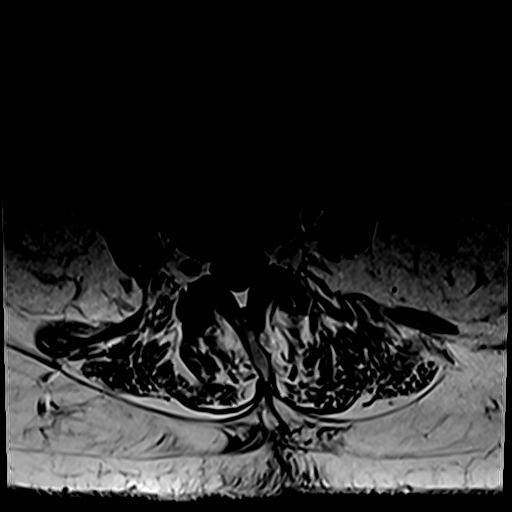
[im 22/39]
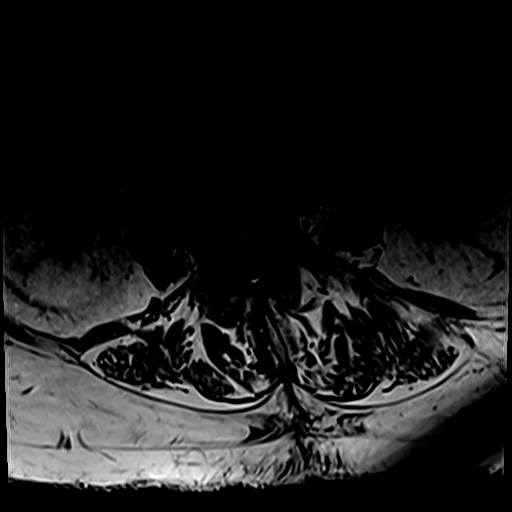
[im 28/39]
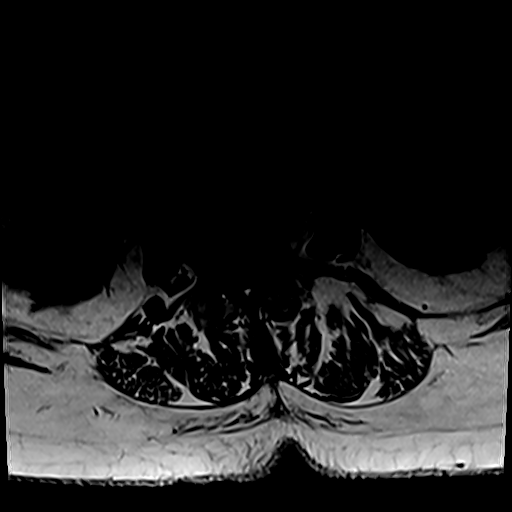
[im 33/39]
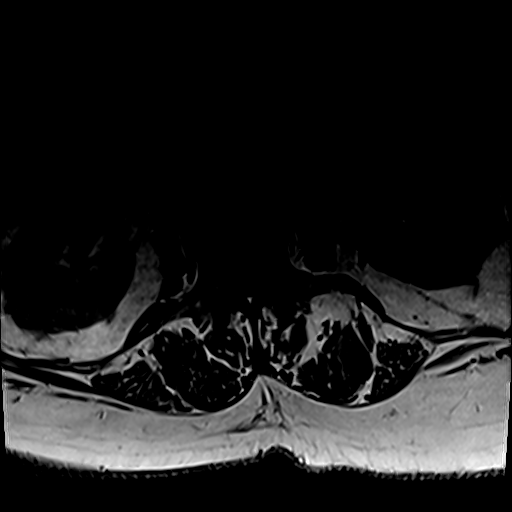

[29 of 48 positions shown; findings below may reference images not displayed]

FINDINGS: Segmentation:  Normal

Alignment:  Mild anterolisthesis L5-S1.

Vertebrae: Moderate compression fracture of L2 vertebral body,
chronic and unchanged from prior studies. No acute fracture.
Hemangioma L3 vertebral body on the left.

Conus medullaris and cauda equina: Conus extends to the T12-L1
level. Conus and cauda equina appear normal.

6 mm rounded mass in the spinal canal posteriorly on the right at
T12-L1 is unchanged from the prior MRI and CT. This is calcified on
CT and may be dural based in location. No compression of the tip of
the conus medullaris which is at this level.

Paraspinal and other soft tissues: Negative for paraspinous mass or
adenopathy

Disc levels:

T12-L1: Negative

L1-2: Disc degeneration with disc bulging and endplate spurring.
Mild left subarticular stenosis. Mild facet degeneration
bilaterally. No change from the prior MRI

L2-3: Mild disc and mild facet degeneration.  Negative for stenosis

L3-4: Mild disc and mild facet degeneration.  Negative for stenosis

L4-5: Left laminectomy. Advanced disc degeneration with disc space
narrowing and diffuse endplate spurring. Bilateral facet
hypertrophy. Moderate subarticular stenosis bilaterally. Interval
improvement in left-sided disc protrusion compared with the prior
MRI 5243

L5-S1: Mild anterolisthesis. Bilateral facet degeneration. No
significant stenosis.
IMPRESSION: 1. Multilevel disc and facet degeneration throughout the lumbar
spine as described above
2. Left laminectomy L4-5. Interval improvement in left-sided disc
protrusion since 5243. There remains moderate subarticular stenosis
bilaterally due to spurring
3. 6 mm calcified mass in the posterior spinal canal on the right at
T12-L1. This is unchanged from 5243. Based on the CT, this may be a
calcified dural-based suggesting meningioma.

ADDENDUM:
Dr. Ziem called to discuss this case. There is an even older MRI
from December 18, 2012 which also shows this abnormality. There is
been no change now for 8 years. Therefore, this is definitely benign
and would not be of any clinical significance. No additional
follow-up is suggested.

*** End of Addendum ***
FINDINGS: Segmentation:  Normal

Alignment:  Mild anterolisthesis L5-S1.

Vertebrae: Moderate compression fracture of L2 vertebral body,
chronic and unchanged from prior studies. No acute fracture.
Hemangioma L3 vertebral body on the left.

Conus medullaris and cauda equina: Conus extends to the T12-L1
level. Conus and cauda equina appear normal.

6 mm rounded mass in the spinal canal posteriorly on the right at
T12-L1 is unchanged from the prior MRI and CT. This is calcified on
CT and may be dural based in location. No compression of the tip of
the conus medullaris which is at this level.

Paraspinal and other soft tissues: Negative for paraspinous mass or
adenopathy

Disc levels:

T12-L1: Negative

L1-2: Disc degeneration with disc bulging and endplate spurring.
Mild left subarticular stenosis. Mild facet degeneration
bilaterally. No change from the prior MRI

L2-3: Mild disc and mild facet degeneration.  Negative for stenosis

L3-4: Mild disc and mild facet degeneration.  Negative for stenosis

L4-5: Left laminectomy. Advanced disc degeneration with disc space
narrowing and diffuse endplate spurring. Bilateral facet
hypertrophy. Moderate subarticular stenosis bilaterally. Interval
improvement in left-sided disc protrusion compared with the prior
MRI 5243

L5-S1: Mild anterolisthesis. Bilateral facet degeneration. No
significant stenosis.
IMPRESSION: 1. Multilevel disc and facet degeneration throughout the lumbar
spine as described above
2. Left laminectomy L4-5. Interval improvement in left-sided disc
protrusion since 5243. There remains moderate subarticular stenosis
bilaterally due to spurring
3. 6 mm calcified mass in the posterior spinal canal on the right at
T12-L1. This is unchanged from 5243. Based on the CT, this may be a
calcified dural-based suggesting meningioma.

## 2022-05-16 ENCOUNTER — Telehealth: Payer: Self-pay | Admitting: Family

## 2022-05-16 MED ORDER — CETIRIZINE HCL 10 MG PO TABS: 10.0000 mg | ORAL_TABLET | Freq: Every day | ORAL | 1 refills | Status: DC

## 2022-05-16 NOTE — Telephone Encounter (Signed)
Pt asks if she can switch back to zyrtec, she is currently taking xyzal and it does not work.

## 2022-05-16 NOTE — Telephone Encounter (Signed)
Spoke with pt and told her that would be fine. She request I send prescription to [facility]. Rx sent.

## 2022-09-17 ENCOUNTER — Other Ambulatory Visit: Payer: Self-pay | Admitting: Family

## 2022-10-31 ENCOUNTER — Other Ambulatory Visit: Payer: Self-pay | Admitting: Family

## 2022-11-30 ENCOUNTER — Other Ambulatory Visit: Payer: Self-pay | Admitting: Family

## 2023-01-05 ENCOUNTER — Other Ambulatory Visit: Payer: Self-pay | Admitting: Family

## 2023-11-11 ENCOUNTER — Other Ambulatory Visit: Payer: Self-pay | Admitting: Family

## 2023-11-18 ENCOUNTER — Emergency Department (HOSPITAL_BASED_OUTPATIENT_CLINIC_OR_DEPARTMENT_OTHER): Payer: PPO

## 2023-11-18 ENCOUNTER — Other Ambulatory Visit: Payer: Self-pay

## 2023-11-18 ENCOUNTER — Encounter (HOSPITAL_BASED_OUTPATIENT_CLINIC_OR_DEPARTMENT_OTHER): Payer: Self-pay

## 2023-11-18 DIAGNOSIS — R109 Unspecified abdominal pain: Secondary | ICD-10-CM | POA: Insufficient documentation

## 2023-11-18 DIAGNOSIS — R0781 Pleurodynia: Secondary | ICD-10-CM | POA: Insufficient documentation

## 2023-11-18 LAB — COMPREHENSIVE METABOLIC PANEL
ALT: 20 U/L (ref 0–44)
AST: 21 U/L (ref 15–41)
Albumin: 4.3 g/dL (ref 3.5–5.0)
Alkaline Phosphatase: 89 U/L (ref 38–126)
Anion gap: 10 (ref 5–15)
BUN: 14 mg/dL (ref 8–23)
CO2: 24 mmol/L (ref 22–32)
Calcium: 9.7 mg/dL (ref 8.9–10.3)
Chloride: 101 mmol/L (ref 98–111)
Creatinine, Ser: 0.57 mg/dL (ref 0.44–1.00)
GFR, Estimated: >60 mL/min (ref >60)
Glucose, Bld: 137 mg/dL — ABNORMAL HIGH (ref 70–99)
Potassium: 3.7 mmol/L (ref 3.5–5.1)
Sodium: 135 mmol/L (ref 135–145)
Total Bilirubin: 1 mg/dL (ref <1.2)
Total Protein: 7.9 g/dL (ref 6.5–8.1)

## 2023-11-18 LAB — CBC
HCT: 40.2 % (ref 36.0–46.0)
Hemoglobin: 13.6 g/dL (ref 12.0–15.0)
MCH: 31.1 pg (ref 26.0–34.0)
MCHC: 33.8 g/dL (ref 30.0–36.0)
MCV: 92 fL (ref 80.0–100.0)
Platelets: 386 10*3/uL (ref 150–400)
RBC: 4.37 MIL/uL (ref 3.87–5.11)
RDW: 12.1 % (ref 11.5–15.5)
WBC: 6.1 10*3/uL (ref 4.0–10.5)
nRBC: 0 % (ref 0.0–0.2)

## 2023-11-18 LAB — URINALYSIS, ROUTINE W REFLEX MICROSCOPIC
Bilirubin Urine: NEGATIVE
Glucose, UA: NEGATIVE mg/dL
Ketones, ur: NEGATIVE mg/dL
Leukocytes,Ua: NEGATIVE
Nitrite: NEGATIVE
Protein, ur: NEGATIVE mg/dL
Specific Gravity, Urine: 1.015 (ref 1.005–1.030)
pH: 6 (ref 5.0–8.0)

## 2023-11-18 LAB — URINALYSIS, MICROSCOPIC (REFLEX)

## 2023-11-18 LAB — LIPASE, BLOOD: Lipase: 27 U/L (ref 11–51)

## 2023-11-18 NOTE — ED Triage Notes (Signed)
Pt reports RT flank pain that began last night. Pt also constipated x1 day, took  miralax x2 with no relief. Pt also endorses rectal bleeding; unsure if it is from hemorrhoids. Pt with hx of diverticulosis; part of colon is removed.

## 2023-11-19 ENCOUNTER — Emergency Department (HOSPITAL_BASED_OUTPATIENT_CLINIC_OR_DEPARTMENT_OTHER)
Admission: EM | Admit: 2023-11-19 | Discharge: 2023-11-19 | Disposition: A | Discharge status: Home / Self Care | Payer: PPO | Attending: Emergency Medicine | Admitting: Emergency Medicine

## 2023-11-19 ENCOUNTER — Emergency Department (HOSPITAL_BASED_OUTPATIENT_CLINIC_OR_DEPARTMENT_OTHER): Payer: PPO

## 2023-11-19 ENCOUNTER — Other Ambulatory Visit: Payer: Self-pay

## 2023-11-19 DIAGNOSIS — R109 Unspecified abdominal pain: Secondary | ICD-10-CM

## 2023-11-19 DIAGNOSIS — R0781 Pleurodynia: Secondary | ICD-10-CM

## 2023-11-19 MED ORDER — IBUPROFEN 800 MG PO TABS
800.0000 mg | ORAL_TABLET | Freq: Once | ORAL | Status: AC
Start: 2023-11-19 — End: 2023-11-19
  Administered 2023-11-19: 800 mg via ORAL
  Filled 2023-11-19: qty 1

## 2023-11-19 NOTE — ED Provider Notes (Signed)
[facility]PT [facility] Emergency Department Provider Note MRN:  [id number]  Arrival date & time: 11/19/23     Chief Complaint   Abdominal Pain   History of Present Illness   Tracey Morgan is a 77 y.o. year-old female with no pertinent past medical history presenting to the ED with chief complaint of abdominal pain.  Right-sided flank pain since last night, feels a bit constipated, had an episode of rectal bleeding but then it stopped.  Wondering if it is her diverticulitis.  Review of Systems  A thorough review of systems was obtained and all systems are negative except as noted in the HPI and PMH.   Patient's Health History    Past Medical History:  Diagnosis Date   Arthritis    back, hips   Diverticulitis    under control   Eczema    GERD (gastroesophageal reflux disease)    Heart murmur    History of kidney stones    Neuromuscular disorder (HCC)    Rt leg    PONV (postoperative nausea and vomiting)    Pre-diabetes     Past Surgical History:  Procedure Laterality Date   ADENOIDECTOMY     APPENDECTOMY  1979   BACK SURGERY  1981   CESAREAN SECTION  1981   CHOLECYSTECTOMY  1972   COLON SURGERY  2021   sigmoidectomy   GALLBLADDER SURGERY  1979   HIP SURGERY Right 03/2021   TONSILLECTOMY     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   Procedure: TOTAL HIP ARTHROPLASTY ANTERIOR APPROACH;  Surgeon: Ollen Gross, MD;  Location: [facility];  Service: Orthopedics;  Laterality: Right;     Family History  Problem Relation Age of Onset   Angioedema Father    Eczema Sister    Immunodeficiency Sister    Food Allergy Niece     Social History   Socioeconomic History   Marital status: Divorced    Spouse name: Not on file   Number of children: Not on file   Years of education: Not on file   Highest education level: Not on file  Occupational History   Not on file  Tobacco Use   Smoking status: Never   Smokeless tobacco: Never  Vaping Use    Vaping status: Never Used  Substance and Sexual Activity   Alcohol use: No   Drug use: No   Sexual activity: Never  Other Topics Concern   Not on file  Social History Narrative   Not on file   Social Drivers of Health   Financial Resource Strain: Not on file  Food Insecurity: Not on file  Transportation Needs: Not on file  Physical Activity: Not on file  Stress: Not on file  Social Connections: Not on file  Intimate Partner Violence: Not on file     Physical Exam   Vitals:   11/18/23 2232  BP: (!) 151/72  Pulse: 88  Resp: 16  Temp: (!) 97.5 F (36.4 C)  SpO2: 99%    CONSTITUTIONAL: Well-appearing, NAD NEURO/PSYCH:  Alert and oriented x 3, no focal deficits EYES:  eyes equal and reactive ENT/NECK:  no LAD, no JVD CARDIO: Regular rate, well-perfused, normal S1 and S2 PULM:  CTAB no wheezing or rhonchi GI/GU:  non-distended, non-tender MSK/SPINE:  No gross deformities, no edema SKIN:  no rash, atraumatic   *Additional and/or pertinent findings included in MDM below  Diagnostic and Interventional Summary    EKG Interpretation Date/Time:    Ventricular Rate:  PR Interval:    QRS Duration:    QT Interval:    QTC Calculation:   R Axis:      Text Interpretation:         Labs Reviewed  COMPREHENSIVE METABOLIC PANEL - Abnormal; Notable for the following components:      Result Value   Glucose, Bld 137 (*)    All other components within normal limits  URINALYSIS, ROUTINE W REFLEX MICROSCOPIC - Abnormal; Notable for the following components:   Hgb urine dipstick TRACE (*)    All other components within normal limits  URINALYSIS, MICROSCOPIC (REFLEX) - Abnormal; Notable for the following components:   Bacteria, UA FEW (*)    All other components within normal limits  LIPASE, BLOOD  CBC  OCCULT BLOOD X 1 CARD TO LAB, STOOL    DG Chest Port 1 View  Final Result    CT RENAL STONE STUDY  Final Result      Medications  ibuprofen (ADVIL) tablet 800  mg (800 mg Oral Given 11/19/23 0100)     Procedures  /  Critical Care Procedures  ED Course and Medical Decision Making  Initial Impression and Ddx Patient has tenderness to the right lateral ribs, worse with movement of the body, seems very MSK in etiology.  She initially described it as flank or abdominal pain.  Differential diagnosis including kidney stone, diverticulitis.  Past medical/surgical history that increases complexity of ED encounter: History of diverticulitis  Interpretation of Diagnostics I personally reviewed the EKG and my interpretation is as follows: Sinus rhythm without concerning ischemic findings  Labs reassuring with no significant blood count or electrolyte disturbance, CT abdomen unremarkable, chest x-ray normal.  Patient Reassessment and Ultimate Disposition/Management     No signs of rash and skin is not hypersensitive, doubt shingles.  Favoring MSK, no signs of emergent process nothing to warrant further testing or admission, appropriate for discharge.  Patient management required discussion with the following services or consulting groups:  None  Complexity of Problems Addressed Acute illness or injury that poses threat of life of bodily function  Additional Data Reviewed and Analyzed Further history obtained from: Prior labs/imaging results  Additional Factors Impacting ED Encounter Risk None  Elmer Sow. Pilar Plate, MD [facility] Emergency Medicine [facility] [email or web address]  Final Clinical Impressions(s) / ED Diagnoses     ICD-10-CM   1. Rib pain  R07.81     2. Flank pain  R10.9       ED Discharge Orders     None        Discharge Instructions Discussed with and Provided to Patient:    Discharge Instructions      You were evaluated in the Emergency Department and after careful evaluation, we did not find any emergent condition requiring admission or further testing in the hospital.  Your exam/testing today is  overall reassuring.  Symptoms seem to be due to muscular strain or spasm or soreness.  Recommend Tylenol and Motrin at home for discomfort, follow-up with your regular doctor.  Please return to the Emergency Department if you experience any worsening of your condition.   Thank you for allowing [place] to be a part of your care.      Sabas Sous, MD 11/19/23 Earle Gell

## 2023-11-19 NOTE — Discharge Instructions (Signed)
You were evaluated in the Emergency Department and after careful evaluation, we did not find any emergent condition requiring admission or further testing in the hospital.  Your exam/testing today is overall reassuring.  Symptoms seem to be due to muscular strain or spasm or soreness.  Recommend Tylenol and Motrin at home for discomfort, follow-up with your regular doctor.  Please return to the Emergency Department if you experience any worsening of your condition.   Thank you for allowing [place] to be a part of your care.

## 2024-01-14 NOTE — Progress Notes (Signed)
 GASTROENTEROLOGY OUTPATIENT OFFICE NOTE  PCP PHYSICIAN: Dr. Norleen Chancy [facility])  CC: Flank pain  HISTORY OF PRESENT ILLNESS: Tracey Morgan is a 78 y.o. female with a significant PMHx including GERD who presents to the office today for further evaluation of flank pain. Her chart reviewed and followed by Ronal BIRCH. Shearin, MD ; last seen in office 05/13/22 for follow up.   She was seen at [facility] ED on 11/19/23 with right flank pain. Labs and CT unremarkable per below. This pain has slowly resolved. She recalls a similar episode a few years prior. The pain is similar to when she was having gallbladder issues; she is s/p open cholecystectomy. Today, flank pain much improved. No nausea or vomiting. On Pepcid  at dinner but usually most nights requires Tums. Does not eat late. Has adjustable bed. Wakes up with heartburn and uses the Tums. Rare dysphagia. Weight down; trying to lose (given Farxiga ). Has irregular bowel habits with using Miralax  every other day. She has some hemorrhoidal bleeding at times. She is on NSAID for hip pain and tries not to take it daily but does require frequent use.   Labs 11/18/23: CBC WNL CMP unremarkable; LFT WNL. Lipase WNL  CTAP wo contrast 11/18/23: No renal calculi or obstructive changes are noted.  Diverticulosis without diverticulitis.  Mild retained fecal material without obstructive change.   CTAP wo contrast 12/24/22: 1. No acute intra-intrapelvic abnormality with limited evaluation on this noncontrast study. 2. Colonic diverticulosis with no acute diverticulitis. 3. Tiny hiatal hernia. 4. Small supraumbilical, umbilical, and infraumbilical fat containing ventral hernias. 5. Aortic Atherosclerosis (ICD10-I70.0) including severe mitral annular calcification.  EGD/colonoscopy 03/03/22: Inflamed intrinsic stricture (traversable) in the GE junction (38 cm from the incisors); performed cold forceps biopsy; dilated with Maloney  dilator to 54 Fr ending size. The GEJ stricture was nodular and hyperemic. The esophagus was otherwise normal. Small hiatal hernia Mild, generalized erythematous mucosa in the stomach; performed cold forceps biopsy to rule out H. pylori. The stomach was otherwise unremarkable. The duodenal bulb, 1st part of the duodenum and 2nd part of the duodenum appeared normal. 10 mm sessile polyp in the cecum; completely removed en bloc by cold snare and retrieved specimen Multiple moderate pancolonic diverticula Healthy colocolonic anastomosis in the sigmoid colon 15 cm from the anal verge Small, internal (grade 1) hemorrhoids The exam was otherwise normal to the cecum. Final Diagnosis A.  STOMACH, ENDOSCOPIC BIOPSY:  CHRONIC GASTRITIS.  FEATURES OF HELICOBACTER GASTRITIS NOT IDENTIFIED.  B.  GASTROESOPHAGEAL JUNCTION, 38 CM, ENDOSCOPIC BIOPSY:  ACUTE AND CHRONIC INFLAMMATION.  NO EVIDENCE OF INTESTINAL METAPLASIA, DYSPLASIA, OR MALIGNANCY.  C.  LARGE INTESTINE, CECUM, ENDOSCOPIC BIOPSY:  TUBULAR ADENOMA.   Colonoscopy (9/19) = severe tics left side, moderate tics in ascending and transverse colon, localized erythema (nonspecific chronic inflammation and benign lymphoid nodule, no dysplasia) and luminal narrowing in the sigmoid, TA polyp and internal hemorrhoids. CABA 9/22 EGD (6/17) = Schatzki's ring @ GEJ (s/p dilation), irregular z-line (bx neg for BE), Gastritis (bx positive for H pylori), two sessile gastric polyps were removed, normal duodenum  ALLERGIES: Allergies  Allergen Reactions  . Codeine GI Intolerance, Other (See Comments) and Rash    Hallucinations  . Hydralazine Anaphylaxis, GI Intolerance, Other (See Comments) and Palpitations    Patient had nausea and headache with po hydralazine 10mg   . Metformin Other (See Comments), Diarrhea and GI Intolerance    Severe colitis.  . Corticosteroids (Glucocorticoids) Other (See Comments)  . Covid-19  Vaccine, Mrna, [id number], Lnp-S Autonation)  Other (See Comments)  . Influenza Virus Vaccine Trivalent Other (See Comments)  . Metronidazole  Other (See Comments)    Not specified  . Morphine Other (See Comments)    hallucination  . Nickel Other (See Comments)  . Oxycodone-Acetaminophen      Pt is unsure  . Buprenorphine Hcl GI Intolerance, Other (See Comments) and Rash    hallucinations  . Hydrocodone  Other (See Comments) and Rash  . Opioids - Morphine Analogues Nausea And Vomiting, Other (See Comments) and Rash    hallucinations    MEDICATIONS:  Current Outpatient Medications:  .  a lipoic acid-biotin-berberine (ALAmax Protect) 125 mg-95 mcg- 250 mg cap, Take  by mouth., Disp: , Rfl:  .  acetaminophen  (TYLENOL ) 325 mg tablet, Take 650 mg by mouth every 6 (six) hours as needed., Disp: , Rfl:  .  aspirin  325 mg tablet, Take 325 mg by mouth Once Daily., Disp: , Rfl:  .  azelastine  (ASTELIN ) 137 mcg (0.1 %) nasal spray, azelastine  137 mcg (0.1 %) nasal spray aerosol USE 1 DOSE IN EACH NOSTRIL TWICE DAILY AS DIRECTED, Disp: , Rfl:  .  b complex vitamins cap capsule, vitamin B complex, Disp: , Rfl:  .  cetirizine  (ZyrTEC ) 10 mg tablet, Take 1 tablet by mouth Once Daily., Disp: , Rfl:  .  cholecalciferol (VITAMIN D3) 1,000 unit (25 mcg) tablet, Take  by mouth., Disp: , Rfl:  .  cinnamon 500 mg cap, Cinnamon, Disp: , Rfl:  .  clobetasoL (TEMOVATE) 0.05 % ointment, Apply  topically., Disp: , Rfl:  .  cyanocobalamin (VITAMIN B12) 100 mcg tablet, Take 100 mcg by mouth Once Daily for 30 days., Disp: 30 tablet, Rfl: 0 .  dapagliflozin  propanediol (FARXIGA ) 10 mg tab tablet, Take 5 mg by mouth daily., Disp: , Rfl:  .  elderberry fruit 350 mg cap, Elderberry, Disp: , Rfl:  .  famotidine  (PEPCID ) 40 mg tablet, Take 40 mg by mouth daily before dinner., Disp: 90 tablet, Rfl: 3 .  fluticasone  propionate (FLONASE ) 50 mcg/spray nasal spray, Administer 2 sprays into each nostril Once Daily., Disp: , Rfl:  .  garlic 100 mg tab, garlic, Disp: , Rfl:   .  hydroCHLOROthiazide (HYDRODIURIL) 12.5 mg tablet, Take 12.5 mg by mouth every other day., Disp: , Rfl:  .  magnesium  200 mg tab, 400 mg., Disp: , Rfl:  .  meclizine  (ANTIVERT ) 25 mg tablet, Take 1 tablet (25 mg total) by mouth 3 (three) times a day as needed for dizziness., Disp: 60 tablet, Rfl: 0 .  meloxicam (MOBIC) 7.5 mg tablet, TAKE 1 TABLET BY MOUTH ONCE DAILY WITH MEALS FOR 30 DAYS, Disp: , Rfl:  .  moxifloxacin (VIGAMOX) 0.5 % ophthalmic solution, Administer 1 drop into the right eye 4 (four) times a day., Disp: 3 mL, Rfl: 1 .  naproxen (NAPROSYN) 500 mg tablet, Take 500 mg by mouth 2 (two) times a day as needed for mild pain (1-3)., Disp: , Rfl:  .  pantoprazole  (PROTONIX ) 40 mg EC tablet, Take 1 tablet (40 mg total) by mouth every morning before breakfast., Disp: 90 tablet, Rfl: 3 .  polysaccharide iron complex (NU-IRON) 150 mg iron capsule, Take 150 mg by mouth 2 (two) times a day for 30 days., Disp: 60 capsule, Rfl: 0 .  progesterone (PROMETRIUM) 100 mg cap capsule, Take 200 mg by mouth at bedtime., Disp: , Rfl:  .  turmeric 400 mg cap, , Disp: , Rfl:   PAST MEDICAL  HISTORY: Patient Active Problem List  Diagnosis  . Mitral valve disorder  . DOE (dyspnea on exertion)  . Closed fracture of neck of radius  . Arthritis, wrist  . Diverticulitis of large intestine  . LLQ pain  . Colon cancer screening  . 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 Heartburn  . GERD (gastroesophageal reflux disease)  . Esophageal dysphagia  . Acute pain of left knee  . History of diverticulitis  . Acute diverticulitis  . Vertigo  . Sigmoid stricture (HCC)  . Long-term use of aspirin  therapy  . Degeneration of lumbar intervertebral disc  . Lumbar radiculopathy  . Osteoarthritis of hip  . Scoliosis deformity of spine  . Type 2 diabetes mellitus, without long-term current use of insulin  (HCC)  . Impaired mobility and activities of daily living  . Hypertension  . Nuclear sclerotic  cataract of right eye  . Diverticulosis of colon  . Gastritis determined by endoscopy  . History of esophageal stricture  . Benign paroxysmal positional vertigo, bilateral  . Cervicalgia    PAST SURGICAL HISTORY: Past Surgical History:  Procedure Laterality Date  . APPENDECTOMY     Procedure: APPENDECTOMY  . BACK SURGERY     Procedure: BACK SURGERY  . CATARACT EXTRACTION W/  INTRAOCULAR LENS IMPLANT Left 04/01/2022   Procedure: CATARACT EXTRACTION W/  INTRAOCULAR LENS IMPLANT; MWE, MD, [id number] 20.0 D, [id number]  . CATARACT EXTRACTION W/  INTRAOCULAR LENS IMPLANT Left 04/01/2022   Procedure: CATARACT EXTRACTION LEFT EYE /W IMPLANT;  Surgeon: Ozell Lemond Sar, MD;  Location: [facility];  Service: Ophthalmology;  Laterality: Left;  45TIP;  PF LIDOCAINE ; AODM; TRYPAN BLUE +/-  . CESAREAN SECTION, UNSPECIFIED     Procedure: CESAREAN SECTION; x 1  . CHOLECYSTECTOMY     Procedure: CHOLECYSTECTOMY  . COLECTOMY N/A 12/14/2019   Procedure: Sigmoid COLECTOMY.  Low anterior anastomosis;  Surgeon: Vanderbilt Krystal Pan, MD;  Location: [facility] MAIN OR;  Service: General;  Laterality: N/A;  Urology on-call to place ureteral stents  . COLONOSCOPY     Procedure: COLONOSCOPY  . CYSTOSCOPY W/ URETERAL STENT PLACEMENT Bilateral 12/14/2019   Procedure: STENT PLACEMENT;  Surgeon: Layman Lorrene Hurst, MD;  Location: [facility] MAIN OR;  Service: Urology;  Laterality: Bilateral;  . GALLBLADDER SURGERY     Procedure: GALLBLADDER SURGERY  . GANGLION CYST EXCISION Left    Procedure: GANGLION CYST EXCISION; wrist  . TONSILLECTOMY     Procedure: TONSILLECTOMY  Right hip total replacement  SOCIAL HISTORY: Social History   Tobacco Use  Smoking Status Never  . Passive exposure: Never  Smokeless Tobacco Never  No Social History   Substance and Sexual Activity  Alcohol  Use No  No Social History   Substance and Sexual Activity  Drug Use No  No  FAMILY HISTORY: Positive for colon cancer  (mother)  LABS: Pertinent labs per HPI  IMAGING:  Pertinent GI imaging per HPI  VITAL SIGNS:  Height: 1.575 m (5' 2) (01/14/2024 10:30 AM) Weight: 88.2 kg (194 lb 8 oz) (01/14/2024 10:30 AM)  Body mass index is 35.57 kg/m.  Vitals:   01/14/24 1030  BP: 124/84  Pulse: 68  SpO2: 98%    PHYSICAL EXAM: Well developed, well nourished.  No acute distress.    ASSESSMENT 1. Flank pain   2. Gastro-esophageal reflux disease without esophagitis   3. History of colon polyps   4. Family history of colon cancer   ? Adhesional pain. ? Sludge. LFT/imaging normal during the  last episode.  GERD not well controlled; particularly at night.   No orders of the defined types were placed in this encounter.   PLAN 1.) We discussed treatment for GERD. After discussion she is interested in trying a PPI. I sent script for pantoprazole  40mg  every day. She is going to take it daily prior to her evening meals. Discussed okay to continue famotidine  40mg  prn and okay to use at bedtime if she wants.  2.) Surveillance colonoscopy 02/2025 3.) Continue Miralax  every other day. We discussed prepH suppository to use prn for hemorrhoidal bleeding.  4.) RTO 3 months or sooner if needed; follow up via portal sooner if needed

## 2024-06-29 NOTE — H&P (Signed)
 TOTAL HIP ADMISSION H&P  Patient is admitted for left total hip arthroplasty.  Subjective:  Chief Complaint: Left hip pain  HPI: Earnie GORMAN Bertrand, 78 y.o. female, has a history of pain and functional disability in the left hip due to arthritis and patient has failed non-surgical conservative treatments for greater than 12 weeks to include NSAID's and/or analgesics, use of assistive devices, and activity modification. Onset of symptoms was gradual, starting several years ago with gradually worsening course since that time. The patient noted no past surgery on the left hip. Patient currently rates pain in the left hip at 8 out of 10 with activity. Patient has worsening of pain with activity and weight bearing, pain that interfers with activities of daily living, and pain with passive range of motion. Patient has evidence of subchondral cysts, periarticular osteophytes, and joint space narrowing by imaging studies. This condition presents safety issues increasing the risk of falls. There is no current active infection.  Patient Active Problem List   Diagnosis Date Noted   OA (osteoarthritis) of hip 03/27/2021   Primary osteoarthritis of right hip 03/27/2021   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    Past Medical History:  Diagnosis Date   Arthritis    back, hips   Diverticulitis    under control   Eczema    GERD (gastroesophageal reflux disease)    Heart murmur    History of kidney stones    Neuromuscular disorder (HCC)    Rt leg    PONV (postoperative nausea and vomiting)    Pre-diabetes     Past Surgical History:  Procedure Laterality Date   ADENOIDECTOMY     APPENDECTOMY  1979   BACK SURGERY  1981   CESAREAN SECTION  1981   CHOLECYSTECTOMY  1972   COLON SURGERY  2021   sigmoidectomy   GALLBLADDER SURGERY  1979   HIP SURGERY Right 03/2021   TONSILLECTOMY     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   Procedure: TOTAL HIP ARTHROPLASTY ANTERIOR APPROACH;   Surgeon: Melodi Lerner, MD;  Location: [facility];  Service: Orthopedics;  Laterality: Right;     Prior to Admission medications   Medication Sig Start Date End Date Taking? Authorizing Provider  albuterol  (VENTOLIN  HFA) 108 (90 Base) MCG/ACT inhaler Inhale 2 puffs into the lungs every 6 (six) hours as needed for wheezing or shortness of breath. 09/05/21   Cheryl Reusing, FNP  Aspirin -Acetaminophen -Caffeine (EXCEDRIN MIGRAINE PO) Take by mouth.    [provider]  azelastine  (ASTELIN ) 0.1 % nasal spray USE 1 DOSE IN EACH NOSTRIL TWICE DAILY AS DIRECTED 09/17/22   Cheryl Reusing, FNP  B Complex Vitamins (B COMPLEX VITAMIN PO) vitamin B complex    [provider]  ARLEENE COVID-19 AG HOME TEST KIT Use as Directed on the Package 11/21/21   [provider]  budesonide  (PULMICORT ) 0.5 MG/2ML nebulizer solution Use 1 unit dose twice a day as directed 10/23/21   Cheryl Reusing, FNP  cetirizine  (ZYRTEC ) 10 MG tablet Take 1 tablet by mouth once daily 11/03/22   Cheryl Reusing, FNP  Cholecalciferol (VITAMIN D-3 PO) Vitamin D3    [provider]  Cinnamon 500 MG capsule Take 500 mg by mouth in the morning, at noon, and at bedtime.    [provider]  clobetasol ointment (TEMOVATE) 0.05 % Apply topically 2 (two) times daily as needed. 05/15/21   [provider]  cyclobenzaprine  (FLEXERIL ) 5 MG tablet cyclobenzaprine  5 mg tablet  Take  1 tablet 3 times a day by oral route as needed for 10 days.    [provider]  diphenhydrAMINE  (BENADRYL ) 25 MG tablet Take 25 mg by mouth at bedtime.    [provider]  ELDERBERRY PO Elderberry    [provider]  EPINEPHrine  0.3 mg/0.3 mL IJ SOAJ injection Inject 0.3 mg into the muscle as needed for anaphylaxis. 12/05/21   Cheryl Reusing, FNP  hydrochlorothiazide (HYDRODIURIL) 12.5 MG tablet Take 12.5 mg by mouth 3 (three) times a week.    [provider]  ketotifen (ZADITOR)  0.025 % ophthalmic solution Place 1 drop into both eyes 2 (two) times daily as needed (eye allergies).    [provider]  levocetirizine (XYZAL ) 5 MG tablet Take 1 tablet (5 mg total) by mouth every evening. 10/22/21   Cheryl Reusing, FNP  Magnesium  250 MG TABS Take 250 mg by mouth daily.    [provider]  meclizine (ANTIVERT) 25 MG tablet Take 25 mg by mouth 3 (three) times daily as needed for dizziness.    [provider]  meloxicam (MOBIC) 7.5 MG tablet Take 7.5 mg by mouth daily. 11/14/21   [provider]  Multiple Vitamins-Minerals (PRESERVISION AREDS 2+MULTI VIT PO) Take 1 capsule by mouth in the morning and at bedtime.    [provider]  POTASSIUM PO potassium    [provider]  traMADol  (ULTRAM ) 50 MG tablet Take 50 mg by mouth 3 (three) times daily as needed. 11/07/21   [provider]  Turmeric 500 MG CAPS turmeric    [provider]  ZINC-VITAMIN C PO Take 1 tablet by mouth daily.    [provider]    Allergies  Allergen Reactions   Hydralazine Anaphylaxis and Nausea Only    Patient had nausea and headache with po hydralazine 10mg     Metformin Other (See Comments)    Severe colitis.   Metronidazole  Other (See Comments)    Not specified   Percocet [Oxycodone-Acetaminophen ]     Pt is unsure    Buprenorphine Hcl Nausea And Vomiting, Rash and Other (See Comments)    hallucinations    Codeine Nausea And Vomiting, Rash and Other (See Comments)    Hallucinations   Hydrocodone  Nausea And Vomiting and Rash   Morphine And Codeine Nausea And Vomiting, Rash and Other (See Comments)    hallucinations    Social History   Socioeconomic History   Marital status: Divorced    Spouse name: Not on file   Number of children: Not on file   Years of education: Not on file   Highest education level: Not on file  Occupational History   Not on file  Tobacco Use   Smoking status: Never   Smokeless  tobacco: Never  Vaping Use   Vaping status: Never Used  Substance and Sexual Activity   Alcohol  use: No   Drug use: No   Sexual activity: Never  Other Topics Concern   Not on file  Social History Narrative   Not on file   Social Drivers of Health   Financial Resource Strain: Not on file  Food Insecurity: Not on file  Transportation Needs: Not on file  Physical Activity: Not on file  Stress: Not on file  Social Connections: Not on file  Intimate Partner Violence: Not on file    Tobacco Use: Low Risk  (03/10/2024)   Received from [facility]   Patient History    Smoking Tobacco Use: Never  Smokeless Tobacco Use: Never    Passive Exposure: Never   Social History   Substance and Sexual Activity  Alcohol  Use No    Family History  Problem Relation Age of Onset   Angioedema Father    Eczema Sister    Immunodeficiency Sister    Food Allergy  Niece     ROS   Objective:   - Well-developed female, alert, oriented, and in no apparent distress.  - Left hip: Flexion to 90 degrees with no internal rotation, approximately 10-20 degrees of external rotation, and 20 degrees of abduction.  - Gait: Antalgic pattern on the left.   IMAGING:  - Radiographs of the hips demonstrate the prosthesis on the right in excellent position with no periprosthetic abnormalities.  - Radiographs of the left hip reveal severe bone-on-bone arthritis with subchondral cystic formation, large osteophytes, and erosion of the femoral head.  - Scoliotic deformity noted, curving towards the right, affecting pelvic tilt.  Assessment/Plan:  End stage arthritis, left hip  The patient history, physical examination, clinical judgement of the provider and imaging studies are consistent with end stage degenerative joint disease of the left hip and total hip arthroplasty is deemed medically necessary. The treatment options including medical management, injection therapy, arthroscopy and arthroplasty were  discussed at length. The risks and benefits of total hip arthroplasty were presented and reviewed. The risks due to aseptic loosening, infection, stiffness, dislocation/subluxation, thromboembolic complications and other imponderables were discussed. The patient acknowledged the explanation, agreed to proceed with the plan and consent was signed. Patient is being admitted for inpatient treatment for surgery, pain control, PT, OT, prophylactic antibiotics, VTE prophylaxis, progressive ambulation and ADLs and discharge planning.The patient is planning to be discharged to skilled nursing facility.  Anticipated LOS equal to or greater than 2 midnights due to - Age 35 and older with one or more of the following:  - Obesity  - Expected need for hospital services (PT, OT, Nursing) required for safe  discharge  - Anticipated need for postoperative skilled nursing care or inpatient rehab  - Active co-morbidities: None OR   - Unanticipated findings during/Post Surgery: Slow post-op progression: GI, pain control, mobility  - Patient is a high risk of re-admission due to: None  Therapy Plans: [facility] Disposition: SNF, [facility] Planned DVT Prophylaxis: Xarelto (ASA allergy ) DME Needed: None PCP: Norleen Chancy, MD (clearance received) TXA: IV Allergies: buprenorphine, codeine and all derivatives (vomiting), corticosteroid (inc glucose), hydralazine (headache, vomiting), hydrocodone , metformin, metronidazole , morphine, nickel, NSAIDs (rash), oxycodone, prednisone (inc glucose) Anesthesia Concerns: Nausea BMI: 34.7 Last HgbA1c: 5.6% 01/18/24 Pharmacy: [facility] on [address] (Printed if going to SNF) Pain Regimen: Hydromorphone   Other: -[facility] x 2 weeks after R THA in 2022 because of back pain and lack of help at home -Pt also with sx c/w right trochanteric bursitis. Will order PT for right hip for teaching purposes and transition to HEP. Will consider right hip CSI in the future if needed.  - Patient was  instructed on what medications to stop prior to surgery. - Follow-up visit in 2 weeks with Dr. Melodi - Begin physical therapy following surgery - Pre-operative lab work as pre-surgical testing - Prescriptions will be provided in hospital at time of discharge  Corean Sender, PA-C Orthopedic Surgery [facility] [place]

## 2024-07-13 NOTE — Patient Instructions (Signed)
 SURGICAL WAITING ROOM VISITATION  Patients having surgery or a procedure may have no more than 2 support people in the waiting area - these visitors may rotate.    Children under the age of 62 must have an adult with them who is not the patient.  Visitors with respiratory illnesses are discouraged from visiting and should remain at home.  If the patient needs to stay at the hospital during part of their recovery, the visitor guidelines for inpatient rooms apply. Pre-op nurse will coordinate an appropriate time for 1 support person to accompany patient in pre-op.  This support person may not rotate.    Please refer to the [facility] website for the visitor guidelines for Inpatients (after your surgery is over and you are in a regular room).    Your procedure is scheduled on: 07/27/24   Report to [facility] Main Entrance    Report to admitting at 8:10 AM   Call this number if you have problems the morning of surgery [phone]   Do not eat food :After Midnight.   After Midnight you may have the following liquids until 7:35 AM DAY OF SURGERY  Water  Non-Citrus Juices (without pulp, NO RED-Apple, White grape, White cranberry) Black Coffee (NO MILK/CREAM OR CREAMERS, sugar ok)  Clear Tea (NO MILK/CREAM OR CREAMERS, sugar ok) regular and decaf                             Plain Jell-O (NO RED)                                           Fruit ices (not with fruit pulp, NO RED)                                     Popsicles (NO RED)                                                               Sports drinks like Gatorade (NO RED)              The day of surgery:  Drink ONE (1) Pre-Surgery G2 at 7:35 AM the morning of surgery. Drink in one sitting. Do not sip.  This drink was given to you during your hospital  pre-op appointment visit. Nothing else to drink after completing the  Pre-Surgery G2.          If you have questions, please contact your surgeon's office.   FOLLOW  BOWEL PREP AND ANY ADDITIONAL PRE OP INSTRUCTIONS YOU RECEIVED FROM YOUR SURGEON'S OFFICE!!!     Oral Hygiene is also important to reduce your risk of infection.                                    Remember - BRUSH YOUR TEETH THE MORNING OF SURGERY WITH YOUR REGULAR TOOTHPASTE  DENTURES WILL BE REMOVED PRIOR TO SURGERY PLEASE DO NOT APPLY Poly grip OR ADHESIVES!!!   Stop all  vitamins and herbal supplements 7 days before surgery.   Take these medicines the morning of surgery with A SIP OF WATER : Tylenol , Zyrtec , Pantoprazole   DO NOT TAKE ANY ORAL DIABETIC MEDICATIONS DAY OF YOUR SURGERY  How to Manage Your Diabetes Before and After Surgery  Why is it important to control my blood sugar before and after surgery? Improving blood sugar levels before and after surgery helps healing and can limit problems. A way of improving blood sugar control is eating a healthy diet by:  Eating less sugar and carbohydrates  Increasing activity/exercise  Talking with your doctor about reaching your blood sugar goals High blood sugars (greater than 180 mg/dL) can raise your risk of infections and slow your recovery, so you will need to focus on controlling your diabetes during the weeks before surgery. Make sure that the doctor who takes care of your diabetes knows about your planned surgery including the date and location.  How do I manage my blood sugar before surgery? Check your blood sugar at least 4 times a day, starting 2 days before surgery, to make sure that the level is not too high or low. Check your blood sugar the morning of your surgery when you wake up and every 2 hours until you get to the Short Stay unit. If your blood sugar is less than 70 mg/dL, you will need to treat for low blood sugar: Do not take insulin. Treat a low blood sugar (less than 70 mg/dL) with  cup of clear juice (cranberry or apple), 4 glucose tablets, OR glucose gel. Recheck blood sugar in 15 minutes after treatment  (to make sure it is greater than 70 mg/dL). If your blood sugar is not greater than 70 mg/dL on recheck, call [phone] for further instructions. Report your blood sugar to the short stay nurse when you get to Short Stay.  If you are admitted to the hospital after surgery: Your blood sugar will be checked by the staff and you will probably be given insulin after surgery (instead of oral diabetes medicines) to make sure you have good blood sugar levels. The goal for blood sugar control after surgery is 80-180 mg/dL.   WHAT DO I DO ABOUT MY DIABETES MEDICATION?  Do not take oral diabetes medicines (pills) the morning of surgery.  Hold Farxiga for 3 days prior to surgery. Last dose 07/23/24  Reviewed and Endorsed by [facility] Patient Education Committee, August 2015             You may not have any metal on your body including hair pins, jewelry, and body piercing             Do not wear make-up, lotions, powders, perfumes, or deodorant  Do not wear nail polish including gel and S&S, artificial/acrylic nails, or any other type of covering on natural nails including finger and toenails. If you have artificial nails, gel coating, etc. that needs to be removed by a nail salon please have this removed prior to surgery or surgery may need to be canceled/ delayed if the surgeon/ anesthesia feels like they are unable to be safely monitored.   Do not shave  48 hours prior to surgery.    Do not bring valuables to the hospital. [facility] IS NOT             RESPONSIBLE   FOR VALUABLES.   Contacts, glasses, dentures or bridgework may not be worn into surgery.   Bring small overnight bag day  of surgery.   DO NOT BRING YOUR HOME MEDICATIONS TO THE HOSPITAL. PHARMACY WILL DISPENSE MEDICATIONS LISTED ON YOUR MEDICATION LIST TO YOU DURING YOUR ADMISSION IN THE HOSPITAL!   Special Instructions: Bring a copy of your healthcare power of attorney and living will documents the day of surgery if you  haven't scanned them before.              Please read over the following fact sheets you were given: IF YOU HAVE QUESTIONS ABOUT YOUR PRE-OP INSTRUCTIONS PLEASE CALL [phone] [place] .   If you received a COVID test during your pre-op visit  it is requested that you wear a mask when out in public, stay away from anyone that may not be feeling well and notify your surgeon if you develop symptoms. If you test positive for Covid or have been in contact with anyone that has tested positive in the last 10 days please notify you surgeon.      Pre-operative 5 CHG Bath Instructions   You can play a key role in reducing the risk of infection after surgery. Your skin needs to be as free of germs as possible. You can reduce the number of germs on your skin by washing with CHG (chlorhexidine  gluconate) soap before surgery. CHG is an antiseptic soap that kills germs and continues to kill germs even after washing.   DO NOT use if you have an allergy  to chlorhexidine /CHG or antibacterial soaps. If your skin becomes reddened or irritated, stop using the CHG and notify one of our RNs at [phone].   Please shower with the CHG soap starting 4 days before surgery using the following schedule:     Please keep in mind the following:  DO NOT shave, including legs and underarms, starting the day of your first shower.   You may shave your face at any point before/day of surgery.  Place clean sheets on your bed the day you start using CHG soap. Use a clean washcloth (not used since being washed) for each shower. DO NOT sleep with pets once you start using the CHG.   CHG Shower Instructions:  If you choose to wash your hair and private area, wash first with your normal shampoo/soap.  After you use shampoo/soap, rinse your hair and body thoroughly to remove shampoo/soap residue.  Turn the water  OFF and apply about 3 tablespoons (45 ml) of CHG soap to a CLEAN washcloth.  Apply CHG soap ONLY FROM YOUR NECK  DOWN TO YOUR TOES (washing for 3-5 minutes)  DO NOT use CHG soap on face, private areas, open wounds, or sores.  Pay special attention to the area where your surgery is being performed.  If you are having back surgery, having someone wash your back for you may be helpful. Wait 2 minutes after CHG soap is applied, then you may rinse off the CHG soap.  Pat dry with a clean towel  Put on clean clothes/pajamas   If you choose to wear lotion, please use ONLY the CHG-compatible lotions on the back of this paper.     Additional instructions for the day of surgery: DO NOT APPLY any lotions, deodorants, cologne, or perfumes.   Put on clean/comfortable clothes.  Brush your teeth.  Ask your nurse before applying any prescription medications to the skin.      CHG Compatible Lotions   Aveeno Moisturizing lotion  Cetaphil Moisturizing Cream  Cetaphil Moisturizing Lotion  Clairol Herbal Essence Moisturizing Lotion, Dry Skin  Clairol Herbal  Essence Moisturizing Lotion, Extra Dry Skin  Clairol Herbal Essence Moisturizing Lotion, Normal Skin  Curel Age Defying Therapeutic Moisturizing Lotion with Alpha Hydroxy  Curel Extreme Care Body Lotion  Curel Soothing Hands Moisturizing Hand Lotion  Curel Therapeutic Moisturizing Cream, Fragrance-Free  Curel Therapeutic Moisturizing Lotion, Fragrance-Free  Curel Therapeutic Moisturizing Lotion, Original Formula  Eucerin Daily Replenishing Lotion  Eucerin Dry Skin Therapy Plus Alpha Hydroxy Crme  Eucerin Dry Skin Therapy Plus Alpha Hydroxy Lotion  Eucerin Original Crme  Eucerin Original Lotion  Eucerin Plus Crme Eucerin Plus Lotion  Eucerin TriLipid Replenishing Lotion  Keri Anti-Bacterial Hand Lotion  Keri Deep Conditioning Original Lotion Dry Skin Formula Softly Scented  Keri Deep Conditioning Original Lotion, Fragrance Free Sensitive Skin Formula  Keri Lotion Fast Absorbing Fragrance Free Sensitive Skin Formula  Keri Lotion Fast Absorbing Softly  Scented Dry Skin Formula  Keri Original Lotion  Keri Skin Renewal Lotion Keri Silky Smooth Lotion  Keri Silky Smooth Sensitive Skin Lotion  Nivea Body Creamy Conditioning Oil  Nivea Body Extra Enriched Teacher, adult education Moisturizing Lotion Nivea Crme  Nivea Skin Firming Lotion  NutraDerm 30 Skin Lotion  NutraDerm Skin Lotion  NutraDerm Therapeutic Skin Cream  NutraDerm Therapeutic Skin Lotion  ProShield Protective Hand Cream  Provon moisturizing lotion  WHAT IS A BLOOD TRANSFUSION? Blood Transfusion Information  A transfusion is the replacement of blood or some of its parts. Blood is made up of multiple cells which provide different functions. Red blood cells carry oxygen and are used for blood loss replacement. White blood cells fight against infection. Platelets control bleeding. Plasma helps clot blood. Other blood products are available for specialized needs, such as hemophilia or other clotting disorders. BEFORE THE TRANSFUSION  Who gives blood for transfusions?  Healthy volunteers who are fully evaluated to make sure their blood is safe. This is blood bank blood. Transfusion therapy is the safest it has ever been in the practice of medicine. Before blood is taken from a donor, a complete history is taken to make sure that person has no history of diseases nor engages in risky social behavior (examples are intravenous drug use or sexual activity with multiple partners). The donor's travel history is screened to minimize risk of transmitting infections, such as malaria. The donated blood is tested for signs of infectious diseases, such as HIV and hepatitis. The blood is then tested to be sure it is compatible with you in order to minimize the chance of a transfusion reaction. If you or a relative donates blood, this is often done in anticipation of surgery and is not appropriate for emergency situations. It takes many days to process the donated  blood. RISKS AND COMPLICATIONS Although transfusion therapy is very safe and saves many lives, the main dangers of transfusion include:  Getting an infectious disease. Developing a transfusion reaction. This is an allergic reaction to something in the blood you were given. Every precaution is taken to prevent this. The decision to have a blood transfusion has been considered carefully by your caregiver before blood is given. Blood is not given unless the benefits outweigh the risks. AFTER THE TRANSFUSION Right after receiving a blood transfusion, you will usually feel much better and more energetic. This is especially true if your red blood cells have gotten low (anemic). The transfusion raises the level of the red blood cells which carry oxygen, and this usually causes an energy increase. The nurse administering the transfusion will monitor you carefully  for complications. HOME CARE INSTRUCTIONS  No special instructions are needed after a transfusion. You may find your energy is better. Speak with your caregiver about any limitations on activity for underlying diseases you may have. SEEK MEDICAL CARE IF:  Your condition is not improving after your transfusion. You develop redness or irritation at the intravenous (IV) site. SEEK IMMEDIATE MEDICAL CARE IF:  Any of the following symptoms occur over the next 12 hours: Shaking chills. You have a temperature by mouth above 102 F (38.9 C), not controlled by medicine. Chest, back, or muscle pain. People around you feel you are not acting correctly or are confused. Shortness of breath or difficulty breathing. Dizziness and fainting. You get a rash or develop hives. You have a decrease in urine output. Your urine turns a dark color or changes to pink, red, or brown. Any of the following symptoms occur over the next 10 days: You have a temperature by mouth above 102 F (38.9 C), not controlled by medicine. Shortness of breath. Weakness after  normal activity. The white part of the eye turns yellow (jaundice). You have a decrease in the amount of urine or are urinating less often. Your urine turns a dark color or changes to pink, red, or brown. Document Released: 11/21/2000 Document Revised: 02/16/2012 Document Reviewed: 07/10/2008 Exit[employer] Patient Information 2014 [place], [place].  _______________________________________________________________________  Incentive Spirometer  An incentive spirometer is a tool that can help keep your lungs clear and active. This tool measures how well you are filling your lungs with each breath. Taking long deep breaths may help reverse or decrease the chance of developing breathing (pulmonary) problems (especially infection) following: A long period of time when you are unable to move or be active. BEFORE THE PROCEDURE  If the spirometer includes an indicator to show your best effort, your nurse or respiratory therapist will set it to a desired goal. If possible, sit up straight or lean slightly forward. Try not to slouch. Hold the incentive spirometer in an upright position. INSTRUCTIONS FOR USE  Sit on the edge of your bed if possible, or sit up as far as you can in bed or on a chair. Hold the incentive spirometer in an upright position. Breathe out normally. Place the mouthpiece in your mouth and seal your lips tightly around it. Breathe in slowly and as deeply as possible, raising the piston or the ball toward the top of the column. Hold your breath for 3-5 seconds or for as long as possible. Allow the piston or ball to fall to the bottom of the column. Remove the mouthpiece from your mouth and breathe out normally. Rest for a few seconds and repeat Steps 1 through 7 at least 10 times every 1-2 hours when you are awake. Take your time and take a few normal breaths between deep breaths. The spirometer may include an indicator to show your best effort. Use the indicator as a goal to work toward  during each repetition. After each set of 10 deep breaths, practice coughing to be sure your lungs are clear. If you have an incision (the cut made at the time of surgery), support your incision when coughing by placing a pillow or rolled up towels firmly against it. Once you are able to get out of bed, walk around indoors and cough well. You may stop using the incentive spirometer when instructed by your caregiver.  RISKS AND COMPLICATIONS Take your time so you do not get dizzy or light-headed. If you are  in pain, you may need to take or ask for pain medication before doing incentive spirometry. It is harder to take a deep breath if you are having pain. AFTER USE Rest and breathe slowly and easily. It can be helpful to keep track of a log of your progress. Your caregiver can provide you with a simple table to help with this. If you are using the spirometer at home, follow these instructions: SEEK MEDICAL CARE IF:  You are having difficultly using the spirometer. You have trouble using the spirometer as often as instructed. Your pain medication is not giving enough relief while using the spirometer. You develop fever of 100.5 F (38.1 C) or higher. SEEK IMMEDIATE MEDICAL CARE IF:  You cough up bloody sputum that had not been present before. You develop fever of 102 F (38.9 C) or greater. You develop worsening pain at or near the incision site. MAKE SURE YOU:  Understand these instructions. Will watch your condition. Will get help right away if you are not doing well or get worse. Document Released: 04/06/2007 Document Revised: 02/16/2012 Document Reviewed: 06/07/2007 [facility] Patient Information 2014 [place], [place].   ________________________________________________________________________

## 2024-07-13 NOTE — Progress Notes (Addendum)
 COVID Vaccine Completed: yes  Date of COVID positive in last 90 days:  PCP - Dr Norleen Chancy Cardiologist - Has not seen since 2022  Chest x-ray - 11/19/23 Epic EKG - 11/19/23 Epic Stress Test - 10/03/19 [facility] ECHO - last March at [facility] Cardiac Cath - n/a Pacemaker/ICD device last checked: n/a Spinal Cord Stimulator: n/a  Bowel Prep - no  Sleep Study - n/a CPAP -   Fasting Blood Sugar - preDM, checks BS if feeling funny Checks Blood Sugar _____ times a day  Last dose of GLP1 agonist-  N/A GLP1 instructions:  Do not take after     Last dose of SGLT-2 inhibitors-  Farxiga  SGLT-2 instructions:  Do not take after  07/23/24   Blood Thinner Instructions:  Last dose: n/a  Time: Aspirin  Instructions: Last Dose:  Activity level: Can perform activities of daily living without stopping and without symptoms of chest pain or shortness of breath. Difficulty with stairs due to hip and back   Anesthesia review: heart murmur, aortic atherosclerosis, rash- seeing PCP and allergist  Patient denies shortness of breath, fever, cough and chest pain at PAT appointment  Patient verbalized understanding of instructions that were given to them at the PAT appointment. Patient was also instructed that they will need to review over the PAT instructions again at home before surgery.

## 2024-07-14 ENCOUNTER — Encounter (HOSPITAL_COMMUNITY): Payer: Self-pay

## 2024-07-14 ENCOUNTER — Other Ambulatory Visit: Payer: Self-pay

## 2024-07-14 ENCOUNTER — Encounter (HOSPITAL_COMMUNITY)
Admission: RE | Admit: 2024-07-14 | Discharge: 2024-07-14 | Disposition: A | Discharge status: Home / Self Care | Source: Ambulatory Visit | Attending: Orthopedic Surgery | Admitting: Orthopedic Surgery

## 2024-07-14 VITALS — BP 150/81 | HR 88 | Temp 98.0°F | Resp 16 | Ht 62.0 in | Wt 185.0 lb

## 2024-07-14 DIAGNOSIS — Z01818 Encounter for other preprocedural examination: Secondary | ICD-10-CM | POA: Insufficient documentation

## 2024-07-14 DIAGNOSIS — E119 Type 2 diabetes mellitus without complications: Secondary | ICD-10-CM | POA: Insufficient documentation

## 2024-07-14 HISTORY — DX: Scoliosis, unspecified: M41.9

## 2024-07-14 HISTORY — DX: Other intervertebral disc degeneration, lumbar region without mention of lumbar back pain or lower extremity pain: M51.369

## 2024-07-14 HISTORY — DX: Pneumonia, unspecified organism: J18.9

## 2024-07-14 HISTORY — DX: Sciatica, unspecified side: M54.30

## 2024-07-14 LAB — TYPE AND SCREEN
ABO/RH(D): A POS
Antibody Screen: NEGATIVE

## 2024-07-14 LAB — CBC
HCT: 44.8 % (ref 36.0–46.0)
Hemoglobin: 14.7 g/dL (ref 12.0–15.0)
MCH: 31 pg (ref 26.0–34.0)
MCHC: 32.8 g/dL (ref 30.0–36.0)
MCV: 94.5 fL (ref 80.0–100.0)
Platelets: 338 K/uL (ref 150–400)
RBC: 4.74 MIL/uL (ref 3.87–5.11)
RDW: 11.9 % (ref 11.5–15.5)
WBC: 5.6 K/uL (ref 4.0–10.5)
nRBC: 0 % (ref 0.0–0.2)

## 2024-07-14 LAB — SURGICAL PCR SCREEN
MRSA, PCR: NEGATIVE
Staphylococcus aureus: NEGATIVE

## 2024-07-14 LAB — BASIC METABOLIC PANEL WITH GFR
Anion gap: 14 (ref 5–15)
BUN: 21 mg/dL (ref 8–23)
CO2: 29 mmol/L (ref 22–32)
Calcium: 10 mg/dL (ref 8.9–10.3)
Chloride: 97 mmol/L — ABNORMAL LOW (ref 98–111)
Creatinine, Ser: 0.99 mg/dL (ref 0.44–1.00)
GFR, Estimated: 59 mL/min — ABNORMAL LOW (ref >60)
Glucose, Bld: 157 mg/dL — ABNORMAL HIGH (ref 70–99)
Potassium: 3.5 mmol/L (ref 3.5–5.1)
Sodium: 140 mmol/L (ref 135–145)

## 2024-07-14 LAB — HEMOGLOBIN A1C
Hgb A1c MFr Bld: 6.4 % — ABNORMAL HIGH (ref 4.8–5.6)
Mean Plasma Glucose: 137 mg/dL

## 2024-07-19 NOTE — Anesthesia Preprocedure Evaluation (Signed)
 Anesthesia Evaluation  Patient identified by MRN, date of birth, ID band Patient awake    Reviewed: Allergy  & Precautions, NPO status , Patient's Chart, lab work & pertinent test results  History of Anesthesia Complications (+) PONV, Family history of anesthesia reaction and history of anesthetic complications  Airway Mallampati: II  TM Distance: >3 FB Neck ROM: Full    Dental no notable dental hx. (+) Implants, Teeth Intact, Dental Advisory Given   Pulmonary    Pulmonary exam normal breath sounds clear to auscultation       Cardiovascular (-) angina (-) Past MI Normal cardiovascular exam Rhythm:Regular Rate:Normal  2020 TTE Ef 65-70% no valvular abnormalities   Neuro/Psych negative neurological ROS  negative psych ROS   GI/Hepatic ,GERD  Medicated,,  Endo/Other    Renal/GU      Musculoskeletal  (+) Arthritis , Osteoarthritis,    Abdominal   Peds  Hematology   Anesthesia Other Findings All: see list  Reproductive/Obstetrics                              Anesthesia Physical Anesthesia Plan  ASA: 3  Anesthesia Plan: Spinal   Post-op Pain Management: Minimal or no pain anticipated and Ofirmev  IV (intra-op)*   Induction:   PONV Risk Score and Plan: 4 or greater and Propofol  infusion, Treatment may vary due to age or medical condition, Ondansetron  and Scopolamine patch - Pre-op  Airway Management Planned: Natural Airway and Nasal Cannula  Additional Equipment: None  Intra-op Plan:   Post-operative Plan: Extubation in OR  Informed Consent: I have reviewed the patients History and Physical, chart, labs and discussed the procedure including the risks, benefits and alternatives for the proposed anesthesia with the patient or authorized representative who has indicated his/her understanding and acceptance.     Dental advisory given  Plan Discussed with: CRNA and  Surgeon  Anesthesia Plan Comments: (See PAT note from 8/11)         Anesthesia Quick Evaluation

## 2024-07-19 NOTE — Progress Notes (Signed)
 Case: [id number] Date/Time: 07/27/24 1022   Procedure: ARTHROPLASTY, HIP, TOTAL, ANTERIOR APPROACH (Left: Hip)   Anesthesia type: Choice   Diagnosis: Primary osteoarthritis of left hip [M16.12]   Pre-op diagnosis: Left Hip Osteoarthritis   Location: [facility] ROOM 10 / [facility]   Surgeons: Melodi Lerner, MD       DISCUSSION: Tracey Morgan is a 78 yo female with PMH of heart murmur, GERD, prediabetes, arthritis s/p R THA (03/2021)  Prior anesthesia complications include PONV  Patient was previously followed by cardiology at [facility] for history of DOE and mitral valve disorder (reported history of mitral stenosis, however echo 08/2019 showed no evidence of this). Stress testing in 2020 normal. She was last seen by Dr. McGukin 10/15/2020 and at that time was advised to follow-up with cardiology as needed.     VS: BP (!) 150/81   Pulse 88   Temp 36.7 C (Oral)   Resp 16   Ht 5' 2 (1.575 m)   Wt 83.9 kg   SpO2 98%   BMI 33.84 kg/m   PROVIDERS: Beverley Norleen NOVAK, MD   LABS: Labs reviewed: Acceptable for surgery. (all labs ordered are listed, but only abnormal results are displayed)  Labs Reviewed  HEMOGLOBIN A1C - Abnormal; Notable for the following components:      Result Value   Hgb A1c MFr Bld 6.4 (*)    All other components within normal limits  BASIC METABOLIC PANEL WITH GFR - Abnormal; Notable for the following components:   Chloride 97 (*)    Glucose, Bld 157 (*)    GFR, Estimated 59 (*)    All other components within normal limits  SURGICAL PCR SCREEN  CBC  TYPE AND SCREEN     IMAGES:   EKG:   CV: Nuclear stress 10/03/2019 (Care Everywhere): Summary   1. Good quality study.   2. Normal Lexiscan stress EKG.   3. Normal resting myocardial perfusion with no evidence of old infarct.   4. Normal stress myocardial perfusion with no evidence of ischemia.   5. Normal LV systolic function, EF 80%    TTE 08/29/2019 (Care Everywhere): Summary  There is no prior  echocardiogram for comparison.  Ejection fraction is visually estimated at 65-70%.  Mild concentric left ventricular hypertrophy.  Normal left ventricular size and systolic function with no appreciable  segmental abnormality.  Indeterminate diastolic function.  Past Medical History:  Diagnosis Date   Arthritis    back, hips   DDD (degenerative disc disease), lumbar    Diverticulitis    under control   Eczema    Family history of adverse reaction to anesthesia    sister and daughter very slow to wake up   GERD (gastroesophageal reflux disease)    Heart murmur    History of kidney stones    Neuromuscular disorder (HCC)    Rt leg    Pneumonia    as child   PONV (postoperative nausea and vomiting)    Pre-diabetes    Sciatica    Scoliosis     Past Surgical History:  Procedure Laterality Date   ADENOIDECTOMY     APPENDECTOMY  1979   BACK SURGERY  1981   CATARACT EXTRACTION Left    CESAREAN SECTION  1981   CHOLECYSTECTOMY  1972   COLON SURGERY  2021   sigmoidectomy   GALLBLADDER SURGERY  1979   HIP SURGERY Right 03/2021   TONSILLECTOMY     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   Procedure:  TOTAL HIP ARTHROPLASTY ANTERIOR APPROACH;  Surgeon: Melodi Lerner, MD;  Location: [facility];  Service: Orthopedics;  Laterality: Right;     MEDICATIONS:  acetaminophen  (TYLENOL ) 500 MG tablet   albuterol  (VENTOLIN  HFA) 108 (90 Base) MCG/ACT inhaler   aspirin -acetaminophen -caffeine (EXCEDRIN MIGRAINE) 250-250-65 MG tablet   azelastine  (ASTELIN ) 0.1 % nasal spray   B Complex Vitamins (B COMPLEX VITAMIN PO)   Berberine Chloride 500 MG CAPS   Biotin-Vitamin C (HAIR SKIN NAILS GUMMIES PO)   budesonide  (PULMICORT ) 0.5 MG/2ML nebulizer solution   cetirizine  (ZYRTEC ) 10 MG tablet   Cinnamon 500 MG capsule   clobetasol ointment (TEMOVATE) 0.05 %   Coenzyme Q10 (CO Q-10) 200 MG CAPS   dapagliflozin  propanediol (FARXIGA ) 5 MG TABS tablet   diphenhydrAMINE  (BENADRYL ) 25 MG tablet    ELDERBERRY PO   EPINEPHrine  0.3 mg/0.3 mL IJ SOAJ injection   famotidine  (PEPCID ) 40 MG tablet   fluticasone  (FLONASE ) 50 MCG/ACT nasal spray   levocetirizine (XYZAL ) 5 MG tablet   Magnesium  Glycinate 120 MG CAPS   MAGNESIUM  PO   Multiple Vitamins-Minerals (PRESERVISION AREDS 2+MULTI VIT PO)   OVER THE COUNTER MEDICATION   pantoprazole  (PROTONIX ) 40 MG tablet   POTASSIUM PO   torsemide  (DEMADEX ) 20 MG tablet   VITAMIN D-VITAMIN K PO   No current facility-administered medications for this encounter.   Burnard CHRISTELLA Odis DEVONNA [facility] Short Stay/Anesthesiology [facility] Phone [phone] 07/19/2024 11:07 AM

## 2024-07-27 ENCOUNTER — Encounter (HOSPITAL_COMMUNITY): Payer: Self-pay | Admitting: Orthopedic Surgery

## 2024-07-27 ENCOUNTER — Encounter (HOSPITAL_COMMUNITY)
Admission: RE | Disposition: A | Discharge status: Skilled Nursing Facility | Payer: Self-pay | Source: Ambulatory Visit | Attending: Orthopedic Surgery

## 2024-07-27 ENCOUNTER — Ambulatory Visit (HOSPITAL_COMMUNITY)

## 2024-07-27 ENCOUNTER — Ambulatory Visit (HOSPITAL_BASED_OUTPATIENT_CLINIC_OR_DEPARTMENT_OTHER): Payer: Self-pay | Admitting: Anesthesiology

## 2024-07-27 ENCOUNTER — Observation Stay (HOSPITAL_COMMUNITY)
Admission: RE | Admit: 2024-07-27 | Discharge: 2024-07-29 | Disposition: A | Discharge status: Skilled Nursing Facility | Source: Ambulatory Visit | Attending: Orthopedic Surgery | Admitting: Orthopedic Surgery

## 2024-07-27 ENCOUNTER — Ambulatory Visit (HOSPITAL_COMMUNITY): Payer: Self-pay | Admitting: Physician Assistant

## 2024-07-27 ENCOUNTER — Other Ambulatory Visit: Payer: Self-pay

## 2024-07-27 ENCOUNTER — Observation Stay (HOSPITAL_COMMUNITY)

## 2024-07-27 DIAGNOSIS — Z96641 Presence of right artificial hip joint: Secondary | ICD-10-CM | POA: Diagnosis not present

## 2024-07-27 DIAGNOSIS — Z7982 Long term (current) use of aspirin: Secondary | ICD-10-CM | POA: Diagnosis not present

## 2024-07-27 DIAGNOSIS — M25552 Pain in left hip: Secondary | ICD-10-CM | POA: Diagnosis present

## 2024-07-27 DIAGNOSIS — M1612 Unilateral primary osteoarthritis, left hip: Principal | ICD-10-CM | POA: Insufficient documentation

## 2024-07-27 DIAGNOSIS — E119 Type 2 diabetes mellitus without complications: Secondary | ICD-10-CM

## 2024-07-27 DIAGNOSIS — M169 Osteoarthritis of hip, unspecified: Principal | ICD-10-CM | POA: Diagnosis present

## 2024-07-27 HISTORY — PX: TOTAL HIP ARTHROPLASTY: SHX124

## 2024-07-27 LAB — GLUCOSE, CAPILLARY
Glucose-Capillary: 144 mg/dL — ABNORMAL HIGH (ref 70–99)
Glucose-Capillary: 215 mg/dL — ABNORMAL HIGH (ref 70–99)

## 2024-07-27 SURGERY — ARTHROPLASTY, HIP, TOTAL, ANTERIOR APPROACH
Anesthesia: Spinal | Site: Hip | Laterality: Left

## 2024-07-27 MED ORDER — ONDANSETRON HCL 4 MG/2ML IJ SOLN
INTRAMUSCULAR | Status: AC
  Filled 2024-07-27: qty 2

## 2024-07-27 MED ORDER — PANTOPRAZOLE SODIUM 40 MG PO TBEC
40.0000 mg | DELAYED_RELEASE_TABLET | Freq: Every day | ORAL | Status: DC
  Administered 2024-07-27 – 2024-07-29 (×3): 40 mg via ORAL
  Filled 2024-07-27 (×3): qty 1

## 2024-07-27 MED ORDER — METOCLOPRAMIDE HCL 5 MG/ML IJ SOLN
5.0000 mg | Freq: Three times a day (TID) | INTRAMUSCULAR | Status: DC | PRN
  Administered 2024-07-27 – 2024-07-28 (×2): 10 mg via INTRAVENOUS
  Filled 2024-07-27: qty 2

## 2024-07-27 MED ORDER — METHOCARBAMOL 500 MG PO TABS
ORAL_TABLET | ORAL | Status: AC
  Filled 2024-07-27: qty 1

## 2024-07-27 MED ORDER — DEXAMETHASONE SODIUM PHOSPHATE 10 MG/ML IJ SOLN
8.0000 mg | Freq: Once | INTRAMUSCULAR | Status: DC
Start: 2024-07-27 — End: 2024-07-27

## 2024-07-27 MED ORDER — CEFAZOLIN SODIUM-DEXTROSE 2-4 GM/100ML-% IV SOLN
2.0000 g | INTRAVENOUS | Status: AC
  Administered 2024-07-27: 2 g via INTRAVENOUS
  Filled 2024-07-27: qty 100

## 2024-07-27 MED ORDER — PHENOL 1.4 % MT LIQD: 1.0000 | OROMUCOSAL | Status: DC | PRN

## 2024-07-27 MED ORDER — ONDANSETRON HCL 4 MG/2ML IJ SOLN: 4.0000 mg | Freq: Once | INTRAMUSCULAR | Status: DC | PRN

## 2024-07-27 MED ORDER — DOCUSATE SODIUM 100 MG PO CAPS
100.0000 mg | ORAL_CAPSULE | Freq: Two times a day (BID) | ORAL | Status: DC
  Administered 2024-07-27 – 2024-07-29 (×4): 100 mg via ORAL
  Filled 2024-07-27 (×4): qty 1

## 2024-07-27 MED ORDER — METHOCARBAMOL 500 MG PO TABS
500.0000 mg | ORAL_TABLET | Freq: Four times a day (QID) | ORAL | Status: DC | PRN
  Administered 2024-07-27 – 2024-07-29 (×8): 500 mg via ORAL
  Filled 2024-07-27 (×7): qty 1

## 2024-07-27 MED ORDER — SODIUM CHLORIDE 0.9 % IV SOLN: INTRAVENOUS | Status: DC

## 2024-07-27 MED ORDER — ONDANSETRON HCL 4 MG PO TABS
4.0000 mg | ORAL_TABLET | Freq: Four times a day (QID) | ORAL | Status: DC | PRN
  Administered 2024-07-28 – 2024-07-29 (×3): 4 mg via ORAL
  Filled 2024-07-27 (×3): qty 1

## 2024-07-27 MED ORDER — FENTANYL CITRATE PF 50 MCG/ML IJ SOSY
PREFILLED_SYRINGE | INTRAMUSCULAR | Status: AC
  Filled 2024-07-27: qty 2

## 2024-07-27 MED ORDER — FENTANYL CITRATE PF 50 MCG/ML IJ SOSY
PREFILLED_SYRINGE | INTRAMUSCULAR | Status: AC
  Filled 2024-07-27: qty 1

## 2024-07-27 MED ORDER — PHENYLEPHRINE HCL-NACL 20-0.9 MG/250ML-% IV SOLN
INTRAVENOUS | Status: DC | PRN
  Administered 2024-07-27: 40 ug/min via INTRAVENOUS

## 2024-07-27 MED ORDER — MENTHOL 3 MG MT LOZG: 1.0000 | LOZENGE | OROMUCOSAL | Status: DC | PRN

## 2024-07-27 MED ORDER — DEXAMETHASONE SODIUM PHOSPHATE 10 MG/ML IJ SOLN
10.0000 mg | Freq: Once | INTRAMUSCULAR | Status: AC
  Administered 2024-07-28: 10 mg via INTRAVENOUS
  Filled 2024-07-27: qty 1

## 2024-07-27 MED ORDER — BUPIVACAINE-EPINEPHRINE (PF) 0.25% -1:200000 IJ SOLN
INTRAMUSCULAR | Status: DC | PRN
Start: 2024-07-27 — End: 2024-07-27
  Administered 2024-07-27: 30 mL

## 2024-07-27 MED ORDER — FAMOTIDINE 20 MG PO TABS: 40.0000 mg | ORAL_TABLET | Freq: Every evening | ORAL | Status: DC | PRN

## 2024-07-27 MED ORDER — POVIDONE-IODINE 10 % EX SWAB
2.0000 | Freq: Once | CUTANEOUS | Status: DC
Start: 2024-07-27 — End: 2024-07-27

## 2024-07-27 MED ORDER — TORSEMIDE 20 MG PO TABS
20.0000 mg | ORAL_TABLET | Freq: Every day | ORAL | Status: DC
  Administered 2024-07-28 – 2024-07-29 (×2): 20 mg via ORAL
  Filled 2024-07-27 (×2): qty 1

## 2024-07-27 MED ORDER — METOCLOPRAMIDE HCL 5 MG/ML IJ SOLN
INTRAMUSCULAR | Status: AC
Start: 2024-07-27 — End: 2024-07-27
  Filled 2024-07-27: qty 2

## 2024-07-27 MED ORDER — ONDANSETRON HCL 4 MG/2ML IJ SOLN
INTRAMUSCULAR | Status: AC
Start: 2024-07-27 — End: 2024-07-27
  Filled 2024-07-27: qty 2

## 2024-07-27 MED ORDER — DAPAGLIFLOZIN PROPANEDIOL 10 MG PO TABS
10.0000 mg | ORAL_TABLET | Freq: Every day | ORAL | Status: DC
  Administered 2024-07-28 – 2024-07-29 (×2): 10 mg via ORAL
  Filled 2024-07-27 (×2): qty 1

## 2024-07-27 MED ORDER — LACTATED RINGERS IV SOLN
INTRAVENOUS | Status: DC | PRN
Start: 2024-07-27 — End: 2024-07-27

## 2024-07-27 MED ORDER — POTASSIUM CHLORIDE CRYS ER 10 MEQ PO TBCR
10.0000 meq | EXTENDED_RELEASE_TABLET | Freq: Every day | ORAL | Status: DC
  Administered 2024-07-28 – 2024-07-29 (×2): 10 meq via ORAL
  Filled 2024-07-27 (×2): qty 1

## 2024-07-27 MED ORDER — FENTANYL CITRATE PF 50 MCG/ML IJ SOSY
25.0000 ug | PREFILLED_SYRINGE | INTRAMUSCULAR | Status: DC | PRN
  Administered 2024-07-27 (×3): 50 ug via INTRAVENOUS

## 2024-07-27 MED ORDER — FENTANYL CITRATE (PF) 100 MCG/2ML IJ SOLN
INTRAMUSCULAR | Status: DC | PRN
  Administered 2024-07-27 (×2): 50 ug via INTRAVENOUS

## 2024-07-27 MED ORDER — ACETAMINOPHEN 10 MG/ML IV SOLN
1000.0000 mg | Freq: Four times a day (QID) | INTRAVENOUS | Status: DC
  Filled 2024-07-27: qty 100

## 2024-07-27 MED ORDER — MAGNESIUM CITRATE PO SOLN: 1.0000 | Freq: Once | ORAL | Status: DC | PRN

## 2024-07-27 MED ORDER — CHLORHEXIDINE GLUCONATE 0.12 % MT SOLN
15.0000 mL | Freq: Once | OROMUCOSAL | Status: AC
Start: 2024-07-27 — End: 2024-07-27
  Administered 2024-07-27: 15 mL via OROMUCOSAL

## 2024-07-27 MED ORDER — 0.9 % SODIUM CHLORIDE (POUR BTL) OPTIME
TOPICAL | Status: DC | PRN
Start: 2024-07-27 — End: 2024-07-27
  Administered 2024-07-27: 1000 mL

## 2024-07-27 MED ORDER — ACETAMINOPHEN 325 MG PO TABS
325.0000 mg | ORAL_TABLET | Freq: Four times a day (QID) | ORAL | Status: DC | PRN
  Administered 2024-07-28 – 2024-07-29 (×4): 650 mg via ORAL
  Filled 2024-07-27 (×4): qty 2

## 2024-07-27 MED ORDER — BUPIVACAINE IN DEXTROSE 0.75-8.25 % IT SOLN
INTRATHECAL | Status: DC | PRN
  Administered 2024-07-27: 12 mg via INTRATHECAL

## 2024-07-27 MED ORDER — RIVAROXABAN 10 MG PO TABS
10.0000 mg | ORAL_TABLET | Freq: Every day | ORAL | Status: DC
  Administered 2024-07-28 – 2024-07-29 (×2): 10 mg via ORAL
  Filled 2024-07-27 (×2): qty 1

## 2024-07-27 MED ORDER — ORAL CARE MOUTH RINSE: 15.0000 mL | Freq: Once | OROMUCOSAL | Status: AC

## 2024-07-27 MED ORDER — ACETAMINOPHEN 10 MG/ML IV SOLN
1000.0000 mg | Freq: Once | INTRAVENOUS | Status: DC | PRN
Start: 2024-07-27 — End: 2024-07-27

## 2024-07-27 MED ORDER — METHOCARBAMOL 1000 MG/10ML IJ SOLN: 500.0000 mg | Freq: Four times a day (QID) | INTRAMUSCULAR | Status: DC | PRN

## 2024-07-27 MED ORDER — ACETAMINOPHEN 10 MG/ML IV SOLN
1000.0000 mg | Freq: Once | INTRAVENOUS | Status: AC
  Administered 2024-07-27: 1000 mg via INTRAVENOUS
  Filled 2024-07-27: qty 100

## 2024-07-27 MED ORDER — PROPOFOL 10 MG/ML IV BOLUS
INTRAVENOUS | Status: AC
  Filled 2024-07-27: qty 20

## 2024-07-27 MED ORDER — HYDROMORPHONE HCL 1 MG/ML IJ SOLN
INTRAMUSCULAR | Status: AC
  Filled 2024-07-27: qty 1

## 2024-07-27 MED ORDER — POTASSIUM 99 MG PO TABS: 99.0000 mg | ORAL_TABLET | Freq: Every day | ORAL | Status: DC

## 2024-07-27 MED ORDER — TRANEXAMIC ACID-NACL 1000-0.7 MG/100ML-% IV SOLN
1000.0000 mg | INTRAVENOUS | Status: DC
  Filled 2024-07-27: qty 100

## 2024-07-27 MED ORDER — HYDROMORPHONE HCL 2 MG PO TABS
2.0000 mg | ORAL_TABLET | ORAL | Status: DC | PRN
  Administered 2024-07-27 – 2024-07-28 (×3): 4 mg via ORAL
  Administered 2024-07-29 (×2): 2 mg via ORAL
  Filled 2024-07-27 (×2): qty 1
  Filled 2024-07-27 (×2): qty 2
  Filled 2024-07-27: qty 1
  Filled 2024-07-27: qty 2

## 2024-07-27 MED ORDER — ONDANSETRON HCL 4 MG/2ML IJ SOLN
INTRAMUSCULAR | Status: DC | PRN
  Administered 2024-07-27: 4 mg via INTRAVENOUS

## 2024-07-27 MED ORDER — FENTANYL CITRATE (PF) 100 MCG/2ML IJ SOLN
INTRAMUSCULAR | Status: AC
Start: 2024-07-27 — End: 2024-07-27
  Filled 2024-07-27: qty 2

## 2024-07-27 MED ORDER — DEXAMETHASONE SODIUM PHOSPHATE 10 MG/ML IJ SOLN
INTRAMUSCULAR | Status: AC
  Filled 2024-07-27: qty 1

## 2024-07-27 MED ORDER — TRANEXAMIC ACID-NACL 1000-0.7 MG/100ML-% IV SOLN
INTRAVENOUS | Status: DC | PRN
  Administered 2024-07-27: 1000 mg via INTRAVENOUS

## 2024-07-27 MED ORDER — LIDOCAINE HCL (PF) 2 % IJ SOLN
INTRAMUSCULAR | Status: AC
  Filled 2024-07-27: qty 5

## 2024-07-27 MED ORDER — BUPIVACAINE-EPINEPHRINE (PF) 0.25% -1:200000 IJ SOLN
INTRAMUSCULAR | Status: AC
  Filled 2024-07-27: qty 30

## 2024-07-27 MED ORDER — INSULIN ASPART 100 UNIT/ML IJ SOLN: 0.0000 [IU] | INTRAMUSCULAR | Status: DC | PRN

## 2024-07-27 MED ORDER — DEXAMETHASONE SODIUM PHOSPHATE 10 MG/ML IJ SOLN
INTRAMUSCULAR | Status: DC | PRN
  Administered 2024-07-27: 8 mg via INTRAVENOUS

## 2024-07-27 MED ORDER — PROPOFOL 10 MG/ML IV BOLUS
INTRAVENOUS | Status: DC | PRN
  Administered 2024-07-27: 20 mg via INTRAVENOUS
  Administered 2024-07-27: 30 mg via INTRAVENOUS
  Administered 2024-07-27: 75 ug/kg/min via INTRAVENOUS
  Administered 2024-07-27: 30 mg via INTRAVENOUS
  Administered 2024-07-27: 20 mg via INTRAVENOUS

## 2024-07-27 MED ORDER — ONDANSETRON HCL 4 MG/2ML IJ SOLN
4.0000 mg | Freq: Four times a day (QID) | INTRAMUSCULAR | Status: DC | PRN
  Administered 2024-07-27 – 2024-07-28 (×3): 4 mg via INTRAVENOUS
  Filled 2024-07-27 (×3): qty 2

## 2024-07-27 MED ORDER — BISACODYL 10 MG RE SUPP: 10.0000 mg | Freq: Every day | RECTAL | Status: DC | PRN

## 2024-07-27 MED ORDER — LACTATED RINGERS IV SOLN
INTRAVENOUS | Status: DC
Start: 2024-07-27 — End: 2024-07-27

## 2024-07-27 MED ORDER — POLYETHYLENE GLYCOL 3350 17 G PO PACK: 17.0000 g | PACK | Freq: Every day | ORAL | Status: DC | PRN

## 2024-07-27 MED ORDER — WATER FOR IRRIGATION, STERILE IR SOLN
Status: DC | PRN
  Administered 2024-07-27: 2000 mL

## 2024-07-27 MED ORDER — LACTATED RINGERS IV SOLN: INTRAVENOUS | Status: DC

## 2024-07-27 MED ORDER — HYDROMORPHONE HCL 1 MG/ML IJ SOLN
0.5000 mg | INTRAMUSCULAR | Status: DC | PRN
  Administered 2024-07-27: 0.5 mg via INTRAVENOUS
  Administered 2024-07-28: 1 mg via INTRAVENOUS
  Filled 2024-07-27: qty 1

## 2024-07-27 MED ORDER — CEFAZOLIN SODIUM-DEXTROSE 2-4 GM/100ML-% IV SOLN
2.0000 g | Freq: Four times a day (QID) | INTRAVENOUS | Status: AC
  Administered 2024-07-27 (×2): 2 g via INTRAVENOUS
  Filled 2024-07-27 (×2): qty 100

## 2024-07-27 MED ORDER — METOCLOPRAMIDE HCL 5 MG PO TABS: 5.0000 mg | ORAL_TABLET | Freq: Three times a day (TID) | ORAL | Status: DC | PRN

## 2024-07-27 SURGICAL SUPPLY — 35 items
BAG COUNTER SPONGE SURGICOUNT (BAG) IMPLANT
BAG ZIPLOCK 12X15 (MISCELLANEOUS) IMPLANT
BLADE SAG 18X100X1.27 (BLADE) ×1 IMPLANT
COVER PERINEAL POST (MISCELLANEOUS) ×1 IMPLANT
COVER SURGICAL LIGHT HANDLE (MISCELLANEOUS) ×1 IMPLANT
CUP ACET PINNACLE SECTR 50MM (Hips) IMPLANT
DERMABOND ADVANCED .7 DNX12 (GAUZE/BANDAGES/DRESSINGS) ×1 IMPLANT
DRAPE FOOT SWITCH (DRAPES) ×1 IMPLANT
DRAPE STERI IOBAN 125X83 (DRAPES) ×1 IMPLANT
DRAPE U-SHAPE 47X51 STRL (DRAPES) ×2 IMPLANT
DRSG AQUACEL AG ADV 3.5X10 (GAUZE/BANDAGES/DRESSINGS) ×1 IMPLANT
DURAPREP 26ML APPLICATOR (WOUND CARE) ×1 IMPLANT
ELECT REM PT RETURN 15FT ADLT (MISCELLANEOUS) ×1 IMPLANT
GLOVE BIO SURGEON STRL SZ 6.5 (GLOVE) IMPLANT
GLOVE BIO SURGEON STRL SZ7 (GLOVE) IMPLANT
GLOVE BIO SURGEON STRL SZ8 (GLOVE) ×1 IMPLANT
GLOVE BIOGEL PI IND STRL 7.0 (GLOVE) IMPLANT
GLOVE BIOGEL PI IND STRL 8 (GLOVE) ×1 IMPLANT
GOWN STRL REUS W/ TWL LRG LVL3 (GOWN DISPOSABLE) ×2 IMPLANT
HEAD FEMORAL 32 CERAMIC (Hips) IMPLANT
HOLDER FOLEY CATH W/STRAP (MISCELLANEOUS) ×1 IMPLANT
KIT TURNOVER KIT A (KITS) ×1 IMPLANT
LINER ACET PNNCL PLUS4 NEUTRAL (Hips) IMPLANT
MANIFOLD NEPTUNE II (INSTRUMENTS) ×1 IMPLANT
PACK ANTERIOR HIP CUSTOM (KITS) ×1 IMPLANT
PENCIL SMOKE EVACUATOR COATED (MISCELLANEOUS) ×1 IMPLANT
SPIKE FLUID TRANSFER (MISCELLANEOUS) ×1 IMPLANT
STEM FEMORAL SZ8 STD ACTIS (Stem) IMPLANT
SUT ETHIBOND NAB CT1 #1 30IN (SUTURE) ×1 IMPLANT
SUT MNCRL AB 4-0 PS2 18 (SUTURE) ×1 IMPLANT
SUT VIC AB 2-0 CT1 TAPERPNT 27 (SUTURE) ×2 IMPLANT
SUTURE STRATFX 0 PDS 27 VIOLET (SUTURE) ×1 IMPLANT
TOWEL GREEN STERILE FF (TOWEL DISPOSABLE) ×1 IMPLANT
TRAY FOLEY MTR SLVR 16FR STAT (SET/KITS/TRAYS/PACK) ×1 IMPLANT
TUBE SUCTION HIGH CAP CLEAR NV (SUCTIONS) ×1 IMPLANT

## 2024-07-27 NOTE — Anesthesia Procedure Notes (Signed)
 Procedure Name: MAC Date/Time: 07/27/2024 12:28 PM  Performed by: Obadiah Reyes BROCKS, CRNAPre-anesthesia Checklist: Patient identified, Emergency Drugs available, Suction available, Patient being monitored and Timeout performed Patient Re-evaluated:Patient Re-evaluated prior to induction Oxygen Delivery Method: Simple face mask Preoxygenation: Pre-oxygenation with 100% oxygen Induction Type: IV induction Airway Equipment and Method: Oral airway

## 2024-07-27 NOTE — Interval H&P Note (Signed)
 History and Physical Interval Note:  07/27/2024 10:51 AM  Tracey Morgan  has presented today for surgery, with the diagnosis of Left Hip Osteoarthritis.  The various methods of treatment have been discussed with the patient and family. After consideration of risks, benefits and other options for treatment, the patient has consented to  Procedure(s): ARTHROPLASTY, HIP, TOTAL, ANTERIOR APPROACH (Left) as a surgical intervention.  The patient's history has been reviewed, patient examined, no change in status, stable for surgery.  I have reviewed the patient's chart and labs.  Questions were answered to the patient's satisfaction.     [name]

## 2024-07-27 NOTE — Op Note (Signed)
 OPERATIVE REPORT- TOTAL HIP ARTHROPLASTY   PREOPERATIVE DIAGNOSIS: Osteoarthritis of the Left hip.   POSTOPERATIVE DIAGNOSIS: Osteoarthritis of the Left  hip.   PROCEDURE: Left total hip arthroplasty, anterior approach.   SURGEON: Dempsey Moan, MD   ASSISTANT: Roxie Mess, PA-C  ANESTHESIA:  Spinal  ESTIMATED BLOOD LOSS:-450 mL    DRAINS: None  COMPLICATIONS: None   CONDITION: PACU - hemodynamically stable.   BRIEF CLINICAL NOTE: Tracey Morgan is a 78 y.o. female who has advanced end-  stage arthritis of their Left  hip with progressively worsening pain and  dysfunction.The patient has failed nonoperative management and presents for  total hip arthroplasty.   PROCEDURE IN DETAIL: After successful administration of spinal  anesthetic, the traction boots for the [facility] bed were placed on both  feet and the patient was placed onto the [facility] bed, boots placed into the leg  holders. The Left hip was then isolated from the perineum with plastic  drapes and prepped and draped in the usual sterile fashion. ASIS and  greater trochanter were marked and a oblique incision was made, starting  at about 1 cm lateral and 2 cm distal to the ASIS and coursing towards  the anterior cortex of the femur. The skin was cut with a 10 blade  through subcutaneous tissue to the level of the fascia overlying the  tensor fascia lata muscle. The fascia was then incised in line with the  incision at the junction of the anterior third and posterior 2/3rd. The  muscle was teased off the fascia and then the interval between the TFL  and the rectus was developed. The Hohmann retractor was then placed at  the top of the femoral neck over the capsule. The vessels overlying the  capsule were cauterized and the fat on top of the capsule was removed.  A Hohmann retractor was then placed anterior underneath the rectus  femoris to give exposure to the entire anterior capsule. A T-shaped  capsulotomy  was performed. The edges were tagged and the femoral head  was identified.       Osteophytes are removed off the superior acetabulum.  The femoral neck was then cut in situ with an oscillating saw. Traction  was then applied to the left lower extremity utilizing the [facility]  traction. The femoral head was then removed. Retractors were placed  around the acetabulum and then circumferential removal of the labrum was  performed. Osteophytes were also removed. Reaming starts at 47 mm to  medialize and  Increased in 2 mm increments to 49 mm. We reamed in  approximately 40 degrees of abduction, 20 degrees anteversion. A 50 mm  pinnacle acetabular shell was then impacted in anatomic position under  fluoroscopic guidance with excellent purchase. We did not need to place  any additional dome screws. A 32 mm neutral + 4 Altrx liner was then  placed into the acetabular shell.       The femoral lift was then placed along the lateral aspect of the femur  just distal to the vastus ridge. The leg was  externally rotated and capsule  was stripped off the inferior aspect of the femoral neck down to the  level of the lesser trochanter, this was done with electrocautery. The femur was lifted after this was performed. The  leg was then placed in an extended and adducted position essentially delivering the femur. We also removed the capsule superiorly and the piriformis from the piriformis fossa to  gain excellent exposure of the  proximal femur. Rongeur was used to remove some cancellous bone to get  into the lateral portion of the proximal femur for placement of the  initial starter reamer. The starter broaches was placed  the starter broach  and was shown to go down the center of the canal. Broaching  with the Actis system was then performed starting at size 0  coursing  Up to size 8. A size 8 had excellent torsional and rotational  and axial stability. The trial standard offset neck was then placed  with a 32 +  1 trial head. The hip was then reduced. We confirmed that  the stem was in the canal both on AP and lateral x-rays. It also has excellent sizing. The hip was reduced with outstanding stability through full extension and full external rotation.. AP pelvis was taken and the leg lengths were measured and found to be equal. Hip was then dislocated again and the femoral head and neck removed. The  femoral broach was removed. Size 8 Actis stem with a standard offset  neck was then impacted into the femur following native anteversion. Has  excellent purchase in the canal. Excellent torsional and rotational and  axial stability. It is confirmed to be in the canal on AP and lateral  fluoroscopic views. The 32 + 1 ceramic head was placed and the hip  reduced with outstanding stability. Again AP pelvis was taken and it  confirmed that the leg lengths were equal. The wound was then copiously  irrigated with saline solution and the capsule reattached and repaired  with Ethibond suture. 30 ml of .25% Bupivicaine was  injected into the capsule and into the edge of the tensor fascia lata as well as subcutaneous tissue. The fascia overlying the tensor fascia lata was then closed with a running #1 V-Loc. Subcu was closed with interrupted 2-0 Vicryl and subcuticular running 4-0 Monocryl. Incision was cleaned  and dried. Steri-Strips and a bulky sterile dressing applied. The patient was awakened and transported to  recovery in stable condition.        Please note that a surgical assistant was a medical necessity for this procedure to perform it in a safe and expeditious manner. Assistant was necessary to provide appropriate retraction of vital neurovascular structures and to prevent femoral fracture and allow for anatomic placement of the prosthesis.  Dempsey Moan, M.D.

## 2024-07-27 NOTE — Anesthesia Procedure Notes (Addendum)
 Spinal  Patient location during procedure: OB Start time: 07/27/2024 12:24 PM End time: 07/27/2024 12:28 PM Reason for block: surgical anesthesia Staffing Performed: anesthesiologist  Anesthesiologist: Jefm Garnette LABOR, MD Performed by: Jefm Garnette LABOR, MD Authorized by: Jefm Garnette LABOR, MD   Preanesthetic Checklist Completed: patient identified, IV checked, risks and benefits discussed, surgical consent, monitors and equipment checked, pre-op evaluation and timeout performed Spinal Block Patient position: sitting Prep: DuraPrep and site prepped and draped Patient monitoring: heart rate, cardiac monitor, continuous pulse ox and blood pressure Approach: midline Location: L3-4 Injection technique: single-shot Needle Needle type: Pencan  Needle gauge: 24 G Needle length: 10 cm Needle insertion depth: 7 cm Assessment Sensory level: T4 Events: CSF return Additional Notes  1 Attempt (s). Pt tolerated procedure well.

## 2024-07-27 NOTE — Anesthesia Postprocedure Evaluation (Signed)
 Anesthesia Post Note  Patient: Tracey Morgan  Procedure(s) Performed: ARTHROPLASTY, HIP, TOTAL, ANTERIOR APPROACH (Left: Hip)     Patient location during evaluation: Nursing Unit Anesthesia Type: Spinal Level of consciousness: oriented and awake and alert Pain management: pain level controlled Vital Signs Assessment: post-procedure vital signs reviewed and stable Respiratory status: spontaneous breathing and respiratory function stable Cardiovascular status: blood pressure returned to baseline and stable Postop Assessment: no headache, no backache, no apparent nausea or vomiting and patient able to bend at knees Anesthetic complications: no   No notable events documented.  Last Vitals:  Vitals:   07/27/24 1436 07/27/24 1445  BP:  (!) 107/48  Pulse: 73 (!) 58  Resp: 19 10  Temp:    SpO2: 100% 100%    Last Pain:  Vitals:   07/27/24 1445  TempSrc:   PainSc: 4                  Garnette DELENA Gab

## 2024-07-27 NOTE — Transfer of Care (Signed)
 Immediate Anesthesia Transfer of Care Note  Patient: Tracey Morgan  Procedure(s) Performed: ARTHROPLASTY, HIP, TOTAL, ANTERIOR APPROACH (Left: Hip)  Patient Location: PACU  Anesthesia Type:MAC and Spinal  Level of Consciousness: awake, alert , and oriented  Airway & Oxygen Therapy: Patient Spontanous Breathing and Patient connected to nasal cannula oxygen  Post-op Assessment: Report given to RN and Post -op Vital signs reviewed and stable  Post vital signs: Reviewed and stable  Last Vitals:  Vitals Value Taken Time  BP 102/55 07/27/24 14:00  Temp    Pulse 81 07/27/24 14:01  Resp 17 07/27/24 14:01  SpO2 90 % 07/27/24 14:01  Vitals shown include unfiled device data.  Last Pain:  Vitals:   07/27/24 0848  TempSrc: Oral         Complications: No notable events documented.

## 2024-07-27 NOTE — Discharge Instructions (Addendum)
 Tracey Moan, MD Total Joint Specialist [facility] [address], [address] [place], [place] [address] [phone]  ANTERIOR APPROACH TOTAL HIP REPLACEMENT POSTOPERATIVE DIRECTIONS     Hip Rehabilitation, Guidelines Following Surgery  The results of a hip operation are greatly improved after range of motion and muscle strengthening exercises. Follow all safety measures which are given to protect your hip. If any of these exercises cause increased pain or swelling in your joint, decrease the amount until you are comfortable again. Then slowly increase the exercises. Call your caregiver if you have problems or questions.   BLOOD CLOT PREVENTION Take a 10 mg Xarelto  once a day for three weeks following surgery.  You may resume your vitamins/supplements once you have discontinued the Xarelto . Do not take any NSAIDs (Advil , Aleve, Ibuprofen , Meloxicam, etc.) until you have discontinued the Xarelto .   HOME CARE INSTRUCTIONS  Remove items at home which could result in a fall. This includes throw rugs or furniture in walking pathways.  ICE to the affected hip as frequently as 20-30 minutes an hour and then as needed for pain and swelling. Continue to use ice on the hip for pain and swelling from surgery. You may notice swelling that will progress down to the foot and ankle. This is normal after surgery. Elevate the leg when you are not up walking on it.   Continue to use the breathing machine which will help keep your temperature down.  It is common for your temperature to cycle up and down following surgery, especially at night when you are not up moving around and exerting yourself.  The breathing machine keeps your lungs expanded and your temperature down.  DIET You may resume your previous home diet once your are discharged from the hospital.  DRESSING / WOUND CARE / SHOWERING You have an adhesive waterproof bandage over the incision. Leave this in place until your first  follow-up appointment. Once you remove this you will not need to place another bandage.  You may begin showering 3 days following surgery, but do not submerge the incision under water .  ACTIVITY For the first 3-5 days, it is important to rest and keep the operative leg elevated. You should, as a general rule, rest for 50 minutes and walk/stretch for 10 minutes per hour. After 5 days, you may slowly increase activity as tolerated.  Perform the exercises you were provided twice a day for about 15-20 minutes each session. Begin these 2 days following surgery. Walk with your walker as instructed. Use the walker until you are comfortable transitioning to a cane. Walk with the cane in the opposite hand of the operative leg. You may discontinue the cane once you are comfortable and walking steadily. Avoid periods of inactivity such as sitting longer than an hour when not asleep. This helps prevent blood clots.  Do not drive a car for 6 weeks or until released by your surgeon.  Do not drive while taking narcotics.  TED HOSE STOCKINGS Wear the elastic stockings on both legs for three weeks following surgery during the day. You may remove them at night while sleeping.  WEIGHT BEARING Weight bearing as tolerated with assist device (walker, cane, etc) as directed, use it as long as suggested by your surgeon or therapist, typically at least 4-6 weeks.  POSTOPERATIVE CONSTIPATION PROTOCOL Constipation - defined medically as fewer than three stools per week and severe constipation as less than one stool per week.  One of the most common issues patients have  following surgery is constipation.  Even if you have a regular bowel pattern at home, your normal regimen is likely to be disrupted due to multiple reasons following surgery.  Combination of anesthesia, postoperative narcotics, change in appetite and fluid intake all can affect your bowels.  In order to avoid complications following surgery, here are some  recommendations in order to help you during your recovery period.  Colace (docusate) - Pick up an over-the-counter form of Colace or another stool softener and take twice a day as long as you are requiring postoperative pain medications.  Take with a full glass of water  daily.  If you experience loose stools or diarrhea, hold the colace until you stool forms back up.  If your symptoms do not get better within 1 week or if they get worse, check with your doctor. Dulcolax (bisacodyl ) - Pick up over-the-counter and take as directed by the product packaging as needed to assist with the movement of your bowels.  Take with a full glass of water .  Use this product as needed if not relieved by Colace only.  MiraLax  (polyethylene glycol) - Pick up over-the-counter to have on hand.  MiraLax  is a solution that will increase the amount of water  in your bowels to assist with bowel movements.  Take as directed and can mix with a glass of water , juice, soda, coffee, or tea.  Take if you go more than two days without a movement.Do not use MiraLax  more than once per day. Call your doctor if you are still constipated or irregular after using this medication for 7 days in a row.  If you continue to have problems with postoperative constipation, please contact the office for further assistance and recommendations.  If you experience the worst abdominal pain ever or develop nausea or vomiting, please contact the office immediatly for further recommendations for treatment.  ITCHING  If you experience itching with your medications, try taking only a single pain pill, or even half a pain pill at a time.  You can also use Benadryl  over the counter for itching or also to help with sleep.   MEDICATIONS See your medication summary on the "After Visit Summary" that the nursing staff will review with you prior to discharge.  You may have some home medications which will be placed on hold until you complete the course of blood  thinner medication.  It is important for you to complete the blood thinner medication as prescribed by your surgeon.  Continue your approved medications as instructed at time of discharge.  PRECAUTIONS If you experience chest pain or shortness of breath - call 911 immediately for transfer to the hospital emergency department.  If you develop a fever greater that 101 F, purulent drainage from wound, increased redness or drainage from wound, foul odor from the wound/dressing, or calf pain - CONTACT YOUR SURGEON.                                                   FOLLOW-UP APPOINTMENTS Make sure you keep all of your appointments after your operation with your surgeon and caregivers. You should call the office at the above phone number and make an appointment for approximately two weeks after the date of your surgery or on the date instructed by your surgeon outlined in the After Visit Summary.  RANGE OF MOTION  AND STRENGTHENING EXERCISES  These exercises are designed to help you keep full movement of your hip joint. Follow your caregiver's or physical therapist's instructions. Perform all exercises about fifteen times, three times per day or as directed. Exercise both hips, even if you have had only one joint replacement. These exercises can be done on a training (exercise) mat, on the floor, on a table or on a bed. Use whatever works the best and is most comfortable for you. Use music or television while you are exercising so that the exercises are a pleasant break in your day. This will make your life better with the exercises acting as a break in routine you can look forward to.  Lying on your back, slowly slide your foot toward your buttocks, raising your knee up off the floor. Then slowly slide your foot back down until your leg is straight again.  Lying on your back spread your legs as far apart as you can without causing discomfort.  Lying on your side, raise your upper leg and foot straight up  from the floor as far as is comfortable. Slowly lower the leg and repeat.  Lying on your back, tighten up the muscle in the front of your thigh (quadriceps muscles). You can do this by keeping your leg straight and trying to raise your heel off the floor. This helps strengthen the largest muscle supporting your knee.  Lying on your back, tighten up the muscles of your buttocks both with the legs straight and with the knee bent at a comfortable angle while keeping your heel on the floor.   POST-OPERATIVE OPIOID TAPER INSTRUCTIONS: It is important to wean off of your opioid medication as soon as possible. If you do not need pain medication after your surgery it is ok to stop day one. Opioids include: Codeine, Hydrocodone (Norco, Vicodin), Oxycodone(Percocet, oxycontin) and hydromorphone  amongst others.  Long term and even short term use of opiods can cause: Increased pain response Dependence Constipation Depression Respiratory depression And more.  Withdrawal symptoms can include Flu like symptoms Nausea, vomiting And more Techniques to manage these symptoms Hydrate well Eat regular healthy meals Stay active Use relaxation techniques(deep breathing, meditating, yoga) Do Not substitute Alcohol  to help with tapering If you have been on opioids for less than two weeks and do not have pain than it is ok to stop all together.  Plan to wean off of opioids This plan should start within one week post op of your joint replacement. Maintain the same interval or time between taking each dose and first decrease the dose.  Cut the total daily intake of opioids by one tablet each day Next start to increase the time between doses. The last dose that should be eliminated is the evening dose.   IF YOU ARE TRANSFERRED TO A SKILLED REHAB FACILITY If the patient is transferred to a skilled rehab facility following release from the hospital, a list of the current medications will be sent to the facility  for the patient to continue.  When discharged from the skilled rehab facility, please have the facility set up the patient's Home Health Physical Therapy prior to being released. Also, the skilled facility will be responsible for providing the patient with their medications at time of release from the facility to include their pain medication, the muscle relaxants, and their blood thinner medication. If the patient is still at the rehab facility at time of the two week follow up appointment, the skilled rehab facility will also  need to assist the patient in arranging follow up appointment in our office and any transportation needs.  MAKE SURE YOU:  Understand these instructions.  Get help right away if you are not doing well or get worse.    DENTAL ANTIBIOTICS:  In most cases prophylactic antibiotics for Dental procdeures after total joint surgery are not necessary.  Exceptions are as follows:  1. History of prior total joint infection  2. Severely immunocompromised (Organ Transplant, cancer chemotherapy, Rheumatoid biologic meds such as Humera)  3. Poorly controlled diabetes (A1C &gt; 8.0, blood glucose over 200)  If you have one of these conditions, contact your surgeon for an antibiotic prescription, prior to your dental procedure.    Pick up stool softner and laxative for home use following surgery while on pain medications. Do not submerge incision under water . Please use good hand washing techniques while changing dressing each day. May shower starting three days after surgery. Please use a clean towel to pat the incision dry following showers. Continue to use ice for pain and swelling after surgery. Do not use any lotions or creams on the incision until instructed by your surgeon.    Information on my medicine - XARELTO  (Rivaroxaban ) This medication education was reviewed with me or my healthcare representative as part of my discharge preparation.    Why was Xarelto   prescribed for you? Xarelto  was prescribed for you to reduce the risk of blood clots forming after orthopedic surgery. The medical term for these abnormal blood clots is venous thromboembolism (VTE).  What do you need to know about xarelto  ? Take your Xarelto  ONCE DAILY at the same time every day. You may take it either with or without food.  If you have difficulty swallowing the tablet whole, you may crush it and mix in applesauce just prior to taking your dose.  Take Xarelto  exactly as prescribed by your doctor and DO NOT stop taking Xarelto  without talking to the doctor who prescribed the medication.  Stopping without other VTE prevention medication to take the place of Xarelto  may increase your risk of developing a clot.  After discharge, you should have regular check-up appointments with your healthcare provider that is prescribing your Xarelto .    What do you do if you miss a dose? If you miss a dose, take it as soon as you remember on the same day then continue your regularly scheduled once daily regimen the next day. Do not take two doses of Xarelto  on the same day.   Important Safety Information A possible side effect of Xarelto  is bleeding. You should call your healthcare provider right away if you experience any of the following: Bleeding from an injury or your nose that does not stop. Unusual colored urine (red or dark brown) or unusual colored stools (red or black). Unusual bruising for unknown reasons. A serious fall or if you hit your head (even if there is no bleeding).  Some medicines may interact with Xarelto  and might increase your risk of bleeding while on Xarelto . To help avoid this, consult your healthcare provider or pharmacist prior to using any new prescription or non-prescription medications, including herbals, vitamins, non-steroidal anti-inflammatory drugs (NSAIDs) and supplements.  This website has more information on Xarelto : www.xarelto .com.

## 2024-07-27 NOTE — Progress Notes (Addendum)
 PT Cancellation Note  Patient Details Name: Tracey Morgan MRN: [id number] DOB: March 29, 1946   Cancelled Treatment:    Reason Eval/Treat Not Completed: Pain limiting ability to participate.  Pt in bed in R side lying. PT arrived 1830. Pt indicates back pain and L hip pain in addition to N and V impacting ability to participate with PT eval 8/20. Family/visitors present and all parties aware of anticipated PT eval 8/21. Nursing has addressed pt pain and nausea. PT to continue to follow acutely.   [place], PT Acute Rehab  [place] VEAR Drone 07/27/2024, 6:42 PM

## 2024-07-27 NOTE — Plan of Care (Signed)
  Problem: Coping: Goal: Level of anxiety will decrease Outcome: Progressing   Problem: Pain Managment: Goal: General experience of comfort will improve and/or be controlled Outcome: Progressing   Problem: Skin Integrity: Goal: Risk for impaired skin integrity will decrease Outcome: Progressing

## 2024-07-28 ENCOUNTER — Telehealth (HOSPITAL_COMMUNITY): Payer: Self-pay | Admitting: Pharmacy Technician

## 2024-07-28 ENCOUNTER — Other Ambulatory Visit (HOSPITAL_COMMUNITY): Payer: Self-pay

## 2024-07-28 ENCOUNTER — Encounter (HOSPITAL_COMMUNITY): Payer: Self-pay | Admitting: Orthopedic Surgery

## 2024-07-28 DIAGNOSIS — M1612 Unilateral primary osteoarthritis, left hip: Secondary | ICD-10-CM | POA: Diagnosis not present

## 2024-07-28 LAB — BASIC METABOLIC PANEL WITH GFR
Anion gap: 5 (ref 5–15)
BUN: 13 mg/dL (ref 8–23)
CO2: 26 mmol/L (ref 22–32)
Calcium: 8.8 mg/dL — ABNORMAL LOW (ref 8.9–10.3)
Chloride: 106 mmol/L (ref 98–111)
Creatinine, Ser: 0.7 mg/dL (ref 0.44–1.00)
GFR, Estimated: >60 mL/min (ref >60)
Glucose, Bld: 168 mg/dL — ABNORMAL HIGH (ref 70–99)
Potassium: 3.9 mmol/L (ref 3.5–5.1)
Sodium: 137 mmol/L (ref 135–145)

## 2024-07-28 LAB — GLUCOSE, CAPILLARY
Glucose-Capillary: 174 mg/dL — ABNORMAL HIGH (ref 70–99)
Glucose-Capillary: 119 mg/dL — ABNORMAL HIGH (ref 70–99)
Glucose-Capillary: 135 mg/dL — ABNORMAL HIGH (ref 70–99)
Glucose-Capillary: 177 mg/dL — ABNORMAL HIGH (ref 70–99)
Glucose-Capillary: 155 mg/dL — ABNORMAL HIGH (ref 70–99)

## 2024-07-28 LAB — CBC
HCT: 31 % — ABNORMAL LOW (ref 36.0–46.0)
Hemoglobin: 10.1 g/dL — ABNORMAL LOW (ref 12.0–15.0)
MCH: 31.3 pg (ref 26.0–34.0)
MCHC: 32.6 g/dL (ref 30.0–36.0)
MCV: 96 fL (ref 80.0–100.0)
Platelets: 239 K/uL (ref 150–400)
RBC: 3.23 MIL/uL — ABNORMAL LOW (ref 3.87–5.11)
RDW: 11.9 % (ref 11.5–15.5)
WBC: 10.9 K/uL — ABNORMAL HIGH (ref 4.0–10.5)
nRBC: 0 % (ref 0.0–0.2)

## 2024-07-28 MED ORDER — PREDNISONE 5 MG (21) PO TBPK
10.0000 mg | ORAL_TABLET | Freq: Every morning | ORAL | Status: AC
  Administered 2024-07-28: 10 mg via ORAL
  Filled 2024-07-28: qty 21

## 2024-07-28 MED ORDER — PREDNISONE 5 MG (21) PO TBPK
5.0000 mg | ORAL_TABLET | ORAL | Status: AC
  Administered 2024-07-28: 5 mg via ORAL

## 2024-07-28 MED ORDER — POLYETHYLENE GLYCOL 3350 17 G PO PACK: 17.0000 g | PACK | Freq: Every day | ORAL | 0 refills | Status: AC | PRN

## 2024-07-28 MED ORDER — PREDNISONE 5 MG (21) PO TBPK: 5.0000 mg | ORAL_TABLET | Freq: Four times a day (QID) | ORAL | Status: DC

## 2024-07-28 MED ORDER — PREDNISONE 5 MG (21) PO TBPK
10.0000 mg | ORAL_TABLET | Freq: Every evening | ORAL | Status: AC
  Administered 2024-07-29: 10 mg via ORAL

## 2024-07-28 MED ORDER — CHLORHEXIDINE GLUCONATE CLOTH 2 % EX PADS
6.0000 | MEDICATED_PAD | Freq: Every day | CUTANEOUS | Status: DC
  Administered 2024-07-28: 6 via TOPICAL

## 2024-07-28 MED ORDER — METHOCARBAMOL 500 MG PO TABS: 500.0000 mg | ORAL_TABLET | Freq: Four times a day (QID) | ORAL | 0 refills | Status: AC | PRN

## 2024-07-28 MED ORDER — ONDANSETRON HCL 4 MG PO TABS: 4.0000 mg | ORAL_TABLET | Freq: Four times a day (QID) | ORAL | 0 refills | Status: DC | PRN

## 2024-07-28 MED ORDER — INSULIN ASPART 100 UNIT/ML IJ SOLN: 0.0000 [IU] | Freq: Every day | INTRAMUSCULAR | Status: DC

## 2024-07-28 MED ORDER — INSULIN ASPART 100 UNIT/ML IJ SOLN
0.0000 [IU] | Freq: Three times a day (TID) | INTRAMUSCULAR | Status: DC
  Administered 2024-07-28: 2 [IU] via SUBCUTANEOUS
  Administered 2024-07-28 – 2024-07-29 (×2): 3 [IU] via SUBCUTANEOUS
  Administered 2024-07-29: 2 [IU] via SUBCUTANEOUS

## 2024-07-28 MED ORDER — RIVAROXABAN 10 MG PO TABS: 10.0000 mg | ORAL_TABLET | Freq: Every day | ORAL | 0 refills | Status: AC

## 2024-07-28 MED ORDER — HYDROMORPHONE HCL 2 MG PO TABS: 2.0000 mg | ORAL_TABLET | Freq: Four times a day (QID) | ORAL | 0 refills | Status: DC | PRN

## 2024-07-28 MED ORDER — DOCUSATE SODIUM 100 MG PO CAPS: 100.0000 mg | ORAL_CAPSULE | Freq: Two times a day (BID) | ORAL | 0 refills | Status: DC

## 2024-07-28 MED ORDER — PREDNISONE 5 MG (21) PO TBPK
5.0000 mg | ORAL_TABLET | Freq: Three times a day (TID) | ORAL | Status: AC
  Administered 2024-07-29 (×3): 5 mg via ORAL

## 2024-07-28 NOTE — Progress Notes (Signed)
   Subjective: 1 Day Post-Op Procedure(s) (LRB): ARTHROPLASTY, HIP, TOTAL, ANTERIOR APPROACH (Left) Patient reports pain as mild.   Patient seen in rounds by Dr. Melodi. Patient is doing fair in regards to the hip. Has complaints of back pain, had the same issue with her right THA. Responded well to dose pack. No issues overnight. Foley catheter to be removed this morning.  We will begin therapy today.   Objective: Vital signs in last 24 hours: Temp:  [97.1 F (36.2 C)-97.8 F (36.6 C)] 97.8 F (36.6 C) (08/21 0203) Pulse Rate:  [56-88] 69 (08/21 0355) Resp:  [10-20] 18 (08/21 0355) BP: (95-137)/(48-76) 111/60 (08/21 0355) SpO2:  [95 %-100 %] 96 % (08/21 0355) Weight:  [83.9 kg] 83.9 kg (08/20 0823)  Intake/Output from previous day:  Intake/Output Summary (Last 24 hours) at 07/28/2024 0727 Last data filed at 07/28/2024 0220 Gross per 24 hour  Intake 2210.69 ml  Output 1525 ml  Net 685.69 ml     Intake/Output this shift: No intake/output data recorded.  Labs: Recent Labs    07/28/24 0352  HGB 10.1*   Recent Labs    07/28/24 0352  WBC 10.9*  RBC 3.23*  HCT 31.0*  PLT 239   Recent Labs    07/28/24 0352  NA 137  K 3.9  CL 106  CO2 26  BUN 13  CREATININE 0.70  GLUCOSE 168*  CALCIUM 8.8*   No results for input(s): LABPT, INR in the last 72 hours.  Exam: General - Patient is Alert and Oriented Extremity - Neurologically intact Neurovascular intact Sensation intact distally Dorsiflexion/Plantar flexion intact Dressing - dressing C/D/I Motor Function - intact, moving foot and toes well on exam.   Past Medical History:  Diagnosis Date   Arthritis    back, hips   DDD (degenerative disc disease), lumbar    Diverticulitis    under control   Eczema    Family history of adverse reaction to anesthesia    sister and daughter very slow to wake up   GERD (gastroesophageal reflux disease)    Heart murmur    History of kidney stones    Neuromuscular  disorder (HCC)    Rt leg    Pneumonia    as child   PONV (postoperative nausea and vomiting)    Pre-diabetes    Sciatica    Scoliosis     Assessment/Plan: 1 Day Post-Op Procedure(s) (LRB): ARTHROPLASTY, HIP, TOTAL, ANTERIOR APPROACH (Left) Principal Problem:   OA (osteoarthritis) of hip Active Problems:   Primary osteoarthritis of left hip  Estimated body mass index is 33.84 kg/m as calculated from the following:   Height as of this encounter: 5' 2 (1.575 m).   Weight as of this encounter: 83.9 kg. Advance diet Up with therapy D/C IV fluids  DVT Prophylaxis - Xarelto  Weight bearing as tolerated. Begin therapy.  Plan is to go to Skilled nursing facility after hospital stay.  Dose pack ordered for back pain. Patient is prediabetic, farxiga  and sliding scale ordered. Will consult social work for discharge to [name].  Roxie Mess, PA-C Orthopedic Surgery [phone] 07/28/2024, 7:27 AM

## 2024-07-28 NOTE — Evaluation (Addendum)
 Physical Therapy Evaluation Patient Details Name: Tracey Morgan MRN: [id number] DOB: 02/07/1946 Today's Date: 07/28/2024  History of Present Illness  78 y.o. female s/p L THA, anterior approach on 07/27/2024 due to failure of conservative measures,. Pt PMH includes but is not limited to: neuromuscular disorder, GERD, OA, back surgery and R THA (2022).  Clinical Impression  Pt is s/p THA resulting in the deficits listed below (see PT Problem List). Pt agreeable to session, premedicated with IV meds and now no pain at rest, 10/10 with movement of LLE. Reports some nausea this morning. Pt able to complete sequence of ankle pumps, quad set, and glute set for desensitization and improved activity tolerance. Will follow up for PM session. Based on pt PLOF, level of support, and current functional status, pt will benefit from skilled inpatient physical therapy <3 hours per day at dc for safe progression of functional mobility.   Pt will benefit from acute skilled PT to increase their independence and safety with mobility to facilitate discharge.          If plan is discharge home, recommend the following: Two people to help with walking and/or transfers;Two people to help with bathing/dressing/bathroom;Assistance with cooking/housework;Assist for transportation;Help with stairs or ramp for entrance   Can travel by private vehicle        Equipment Recommendations None recommended by PT  Recommendations for Other Services       Functional Status Assessment Patient has had a recent decline in their functional status and demonstrates the ability to make significant improvements in function in a reasonable and predictable amount of time.     Precautions / Restrictions Precautions Precautions: Fall Recall of Precautions/Restrictions: Intact Restrictions Weight Bearing Restrictions Per Provider Order: Yes LLE Weight Bearing Per Provider Order: Weight bearing as tolerated      Mobility  Bed  Mobility Overal bed mobility: Needs Assistance Bed Mobility: Supine to Sit     Supine to sit: Total assist     General bed mobility comments: Pt able to initiate BLE towards EOB for sup to sit transfer but unable to tolerate, max A provided to assist, pain continues to limit activity tolerance. HEP initiated to improve tolerance to movement in LLE and will follow up later today for PM session    Transfers                        Ambulation/Gait                  Stairs            Wheelchair Mobility     Tilt Bed    Modified Rankin (Stroke Patients Only)       Balance                                             Pertinent Vitals/Pain Pain Assessment Pain Assessment: Faces Faces Pain Scale: Hurts worst Pain Location: L hip with movement, at rest 0/10 Pain Descriptors / Indicators: Aching, Discomfort, Grimacing, Sharp, Shooting, Operative site guarding Pain Intervention(s): Limited activity within patient's tolerance, Monitored during session, Premedicated before session    Home Living Family/patient expects to be discharged to:: Skilled nursing facility [facility]) Living Arrangements: Alone                 Additional Comments: family able to help  Prior Function Prior Level of Function : Independent/Modified Independent                     Extremity/Trunk Assessment        Lower Extremity Assessment Lower Extremity Assessment: LLE deficits/detail LLE: Unable to fully assess due to pain       Communication   Communication Communication: No apparent difficulties    Cognition Arousal: Alert Behavior During Therapy: WFL for tasks assessed/performed   PT - Cognitive impairments: No apparent impairments                         Following commands: Intact       Cueing Cueing Techniques: Verbal cues, Gestural cues     General Comments General comments (skin integrity, edema, etc.):  unable to assess balance this session    Exercises Total Joint Exercises Ankle Circles/Pumps: AROM, 20 reps, Both, Supine Quad Sets: 10 reps, AROM, Both, Supine Gluteal Sets: AROM, 10 reps, Both, Supine   Assessment/Plan    PT Assessment Patient needs continued PT services  PT Problem List Decreased strength;Decreased balance;Decreased range of motion;Decreased mobility;Decreased activity tolerance       PT Treatment Interventions Functional mobility training;DME instruction;Balance training;Patient/family education;Gait training;Therapeutic activities;Stair training;Therapeutic exercise    PT Goals (Current goals can be found in the Care Plan section)  Acute Rehab PT Goals Patient Stated Goal: decrease pain PT Goal Formulation: With patient Time For Goal Achievement: 08/11/24 Potential to Achieve Goals: Good    Frequency 7X/week     Co-evaluation               AM-PAC PT 6 Clicks Mobility  Outcome Measure Help needed turning from your back to your side while in a flat bed without using bedrails?: Total Help needed moving from lying on your back to sitting on the side of a flat bed without using bedrails?: Total Help needed moving to and from a bed to a chair (including a wheelchair)?: Total Help needed standing up from a chair using your arms (e.g., wheelchair or bedside chair)?: Total Help needed to walk in hospital room?: Total Help needed climbing 3-5 steps with a railing? : Total 6 Click Score: 6    End of Session   Activity Tolerance: Patient limited by pain Patient left: in bed;with call bell/phone within reach;with family/visitor present;with bed alarm set Nurse Communication: Mobility status PT Visit Diagnosis: Unsteadiness on feet (R26.81);Pain;Difficulty in walking, not elsewhere classified (R26.2) Pain - Right/Left: Left Pain - part of body: Hip    Time: 9055-8986 PT Time Calculation (min) (ACUTE ONLY): 29 min   Charges:   PT Evaluation $PT  Eval Low Complexity: 1 Low PT Treatments $Therapeutic Exercise: 8-22 mins PT General Charges $$ ACUTE PT VISIT: 1 Visit         [place], PT Acute Rehabilitation Services Office: [phone] 07/28/2024   [place]ann DELENA Ohara 07/28/2024, 11:40 AM

## 2024-07-28 NOTE — TOC Initial Note (Addendum)
 Transition of Care [facility]) - Initial/Assessment Note    Patient Details  Name: Tracey Morgan MRN: [id number] Date of Birth: 1946/08/19  Transition of Care [facility]) CM/SW Contact:    Alfonse [name], RN Phone Number: 07/28/2024, 9:33 AM  Clinical Narrative:   Met with patient and daughter at bedside to review dc therapy and home equipment needs. TOC consult for SNF. Patient/dtr confirmed would like patient to dc to [name] for short term rehab. PT eval pending, await recommendation. MOON completed. NCM reached out to [place], admit coordinator w/[facility], confirmed bed available, will update FL2 and initiate insurance auth.       -2:00pm Call to [employer] to initiate SNF auth, spoke with Jori, await determination.             Expected Discharge Plan: Skilled Nursing Facility Barriers to Discharge: Continued Medical Work up   Patient Goals and CMS Choice Patient states their goals for this hospitalization and ongoing recovery are:: SNF at [name] CMS Medicare.gov Compare Post Acute Care list provided to:: Patient Choice offered to / list presented to : Patient [place] ownership interest in [facility].provided to:: Patient    Expected Discharge Plan and Services       Living arrangements for the past 2 months: Single Family Home Expected Discharge Date: 07/28/24                                    Prior Living Arrangements/Services Living arrangements for the past 2 months: Single Family Home Lives with:: Self Patient language and need for interpreter reviewed:: Yes Do you feel safe going back to the place where you live?: Yes      Need for Family Participation in Patient Care: Yes (Comment) Care giver support system in place?: Yes (comment) Current home services:  (RW) Criminal Activity/Legal Involvement Pertinent to Current Situation/Hospitalization: No - Comment as needed  Activities of Daily Living   ADL Screening (condition at time  of admission) Independently performs ADLs?: Yes (appropriate for developmental age) Is the patient deaf or have difficulty hearing?: No Does the patient have difficulty seeing, even when wearing glasses/contacts?: No Does the patient have difficulty concentrating, remembering, or making decisions?: No  Permission Sought/Granted                  Emotional Assessment Appearance:: Appears stated age Attitude/Demeanor/Rapport: Engaged Affect (typically observed): Accepting Orientation: : Oriented to Self, Oriented to Place, Oriented to  Time, Oriented to Situation Alcohol  / Substance Use: Not Applicable Psych Involvement: No (comment)  Admission diagnosis:  Primary osteoarthritis of left hip [M16.12] Patient Active Problem List   Diagnosis Date Noted   Primary osteoarthritis of left hip 07/27/2024   OA (osteoarthritis) of hip 03/27/2021   Primary osteoarthritis of right hip 03/27/2021   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   PCP:  Beverley Norleen NOVAK, MD Pharmacy:   [facility] [address], [place] - [address] [place] [address] Phone: [phone] Fax: [phone]     Social Drivers of Health (SDOH) Social History: SDOH Screenings   Food Insecurity: No Food Insecurity (07/27/2024)  Housing: Low Risk  (07/27/2024)  Transportation Needs: No Transportation Needs (07/27/2024)  Utilities: Not At Risk (07/27/2024)  Social Connections: Moderately Isolated (07/27/2024)  Tobacco Use: Low Risk  (07/27/2024)   SDOH Interventions:     Readmission Risk Interventions  No data to display

## 2024-07-28 NOTE — Plan of Care (Signed)
  Problem: Coping: Goal: Level of anxiety will decrease Outcome: Progressing   Problem: Pain Managment: Goal: General experience of comfort will improve and/or be controlled Outcome: Progressing   Problem: Safety: Goal: Ability to remain free from injury will improve Outcome: Progressing   Problem: Skin Integrity: Goal: Risk for impaired skin integrity will decrease Outcome: Progressing   Problem: Activity: Goal: Ability to avoid complications of mobility impairment will improve Outcome: Progressing Goal: Ability to tolerate increased activity will improve Outcome: Progressing

## 2024-07-28 NOTE — Progress Notes (Signed)
 Physical Therapy Treatment Patient Details Name: Tracey Morgan MRN: [id number] DOB: 11-Sep-1946 Today's Date: 07/28/2024   History of Present Illness 78 y.o. female s/p L THA, anterior approach on 07/27/2024 due to failure of conservative measures,. Pt PMH includes but is not limited to: neuromuscular disorder, GERD, OA, back surgery and R THA (2022).    PT Comments  Pt in bed, agreeable to session. She reports that she is feeling much better than this morning, has less pain, and is able to complete knee flexion and extension in bed without increased pain. She is able to come to sit EOB at [facility] and maintains sitting balance x5-10 mins before progressing to standing with RW completing weight shifting and side stepping at EOB. Pt provided HEP for completion with progression to standing exercises when indicated, currently working on supine and sitting exercises to progress tolerance and function. Based on pt PLOF, level of support, and current functional status, pt will benefit from skilled inpatient physical therapy <3 hours per day at dc for safe progression of functional mobility.     If plan is discharge home, recommend the following: Assistance with cooking/housework;Assist for transportation;Help with stairs or ramp for entrance;Two people to help with walking and/or transfers;A little help with bathing/dressing/bathroom   Can travel by private vehicle     No  Equipment Recommendations  None recommended by PT    Recommendations for Other Services       Precautions / Restrictions Precautions Precautions: Fall Recall of Precautions/Restrictions: Intact Restrictions Weight Bearing Restrictions Per Provider Order: Yes LLE Weight Bearing Per Provider Order: Weight bearing as tolerated     Mobility  Bed Mobility Overal bed mobility: Needs Assistance Bed Mobility: Supine to Sit, Sit to Supine     Supine to sit: Contact guard Sit to supine: Contact guard assist   General bed mobility  comments: LE assist to prevent LLE dropping off EOB when coming to sit, returning to supine, pt uses belt and is able to complete most of the transfer, CGA provided for LLE management    Transfers Overall transfer level: Needs assistance Equipment used: Rolling walker (2 wheels) Transfers: Sit to/from Stand Sit to Stand: Contact guard assist           General transfer comment: steady and stable transfer.    Ambulation/Gait Ambulation/Gait assistance: Contact guard assist             General Gait Details: Pt completes side stepping at EOB x3 feet towards foot of bed and returns to [facility] with small steps, but good weight shift.   Stairs             Wheelchair Mobility     Tilt Bed    Modified Rankin (Stroke Patients Only)       Balance Overall balance assessment: Needs assistance Sitting-balance support: Feet supported, No upper extremity supported Sitting balance-Leahy Scale: Fair     Standing balance support: Reliant on assistive device for balance, During functional activity Standing balance-Leahy Scale: Fair                              Hotel manager: No apparent difficulties  Cognition Arousal: Alert Behavior During Therapy: WFL for tasks assessed/performed   PT - Cognitive impairments: No apparent impairments                         Following commands: Intact  Cueing Cueing Techniques: Verbal cues, Gestural cues  Exercises Total Joint Exercises Ankle Circles/Pumps: AROM, 20 reps, Both, Supine Quad Sets: 10 reps, AROM, Both, Supine Gluteal Sets: AROM, 10 reps, Both, Supine    General Comments General comments (skin integrity, edema, etc.): improving tolerance to mobility in 2nd session      Pertinent Vitals/Pain Pain Assessment Pain Assessment: Faces Faces Pain Scale: Hurts little more Pain Location: L hip with movement, at rest 0/10 Pain Descriptors / Indicators: Aching,  Discomfort, Grimacing, Operative site guarding Pain Intervention(s): Limited activity within patient's tolerance, Monitored during session, Repositioned    Home Living                          Prior Function            PT Goals (current goals can now be found in the care plan section) Acute Rehab PT Goals Patient Stated Goal: decrease pain PT Goal Formulation: With patient Time For Goal Achievement: 08/11/24 Potential to Achieve Goals: Good Progress towards PT goals: Progressing toward goals    Frequency    7X/week      PT Plan      Co-evaluation              AM-PAC PT 6 Clicks Mobility   Outcome Measure  Help needed turning from your back to your side while in a flat bed without using bedrails?: A Little Help needed moving from lying on your back to sitting on the side of a flat bed without using bedrails?: A Little Help needed moving to and from a bed to a chair (including a wheelchair)?: A Lot Help needed standing up from a chair using your arms (e.g., wheelchair or bedside chair)?: A Little Help needed to walk in hospital room?: Total Help needed climbing 3-5 steps with a railing? : Total 6 Click Score: 13    End of Session Equipment Utilized During Treatment: Gait belt Activity Tolerance: Patient tolerated treatment well Patient left: in bed;with call bell/phone within reach;with family/visitor present;with bed alarm set Nurse Communication: Mobility status PT Visit Diagnosis: Unsteadiness on feet (R26.81);Pain;Difficulty in walking, not elsewhere classified (R26.2) Pain - Right/Left: Left Pain - part of body: Hip     Time: 8640-8557 PT Time Calculation (min) (ACUTE ONLY): 43 min  Charges:    $Therapeutic Activity: 38-52 mins PT General Charges $$ ACUTE PT VISIT: 1 Visit                     Stann, PT Acute Rehabilitation Services Office: [phone] 07/28/2024    Stann DELENA Ohara 07/28/2024, 2:57 PM

## 2024-07-28 NOTE — Care Management Obs Status (Signed)
 MEDICARE OBSERVATION STATUS NOTIFICATION   Patient Details  Name: Tracey Morgan MRN: [id number] Date of Birth: 1946/11/04   Medicare Observation Status Notification Given:  Yes    Alfonse [name], RN 07/28/2024, 8:59 AM

## 2024-07-28 NOTE — [place] FL2 (Signed)
   MEDICAID FL2 LEVEL OF CARE FORM     IDENTIFICATION  Patient Name: Tracey Morgan Birthdate: 1946-02-09 Sex: female Admission Date (Current Location): 07/27/2024  [facility] and [place] Number:      Facility and Address:  [facility],  [address] [place] [address]      Provider Number: [id number]  Attending Physician Name and Address:  Melodi Lerner, MD  Relative Name and Phone Number:  Ceclia Domino (Daughter)  [phone] [facility])    Current Level of Care: Hospital Recommended Level of Care: Skilled Nursing Facility Prior Approval Number:    Date Approved/Denied:   PASRR Number: [id number]  Discharge Plan: SNF    Current Diagnoses: Patient Active Problem List   Diagnosis Date Noted   Primary osteoarthritis of left hip 07/27/2024   OA (osteoarthritis) of hip 03/27/2021   Primary osteoarthritis of right hip 03/27/2021   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    Orientation RESPIRATION BLADDER Height & Weight     Self, Time, Situation, Place  O2 Continent, Indwelling catheter Weight: 83.9 kg Height:  5' 2 (157.5 cm)  BEHAVIORAL SYMPTOMS/MOOD NEUROLOGICAL BOWEL NUTRITION STATUS      Continent Diet (regular)  AMBULATORY STATUS COMMUNICATION OF NEEDS Skin   Extensive Assist Verbally Surgical wounds (Left Total Hip Arthroplasty)                       Personal Care Assistance Level of Assistance  Bathing, Feeding, Dressing Bathing Assistance: Maximum assistance Feeding assistance: Limited assistance Dressing Assistance: Maximum assistance     Functional Limitations Info  Sight, Hearing, Speech Sight Info: Impaired (reading glasses) Hearing Info: Adequate Speech Info: Adequate    SPECIAL CARE FACTORS FREQUENCY  PT (By licensed PT), OT (By licensed OT)     PT Frequency: 5x/wk OT Frequency: 5x/wk            Contractures Contractures Info: Not present    Additional Factors Info  Code Status, Allergies,  Psychotropic Code Status Info: Full Code Allergies Info: Hydralazine, Metformin, Metronidazole , Percocet (Oxycodone-acetaminophen ), Buprenorphine Hcl, Codeine, Hydrocodone , Morphine And Codeine Psychotropic Info: N/A         Current Medications (07/28/2024):  This is the current hospital active medication list Current Facility-Administered Medications  Medication Dose Route Frequency Provider Last Rate Last Admin   0.9 %  sodium chloride  infusion   Intravenous Continuous Edmisten, Kristie L, PA 75 mL/hr at 07/28/24 0754 New Bag at 07/28/24 0754   acetaminophen  (TYLENOL ) tablet 325-650 mg  325-650 mg Oral Q6H PRN Edmisten, Kristie L, PA       bisacodyl  (DULCOLAX) suppository 10 mg  10 mg Rectal Daily PRN Edmisten, Kristie L, PA       dapagliflozin  propanediol (FARXIGA ) tablet 10 mg  10 mg Oral Daily Edmisten, Kristie L, PA   10 mg at 07/28/24 9081   docusate sodium  (COLACE) capsule 100 mg  100 mg Oral BID Edmisten, Kristie L, PA   100 mg at 07/28/24 9082   famotidine  (PEPCID ) tablet 40 mg  40 mg Oral QHS PRN Edmisten, Kristie L, PA       HYDROmorphone  (DILAUDID ) injection 0.5-1 mg  0.5-1 mg Intravenous Q4H PRN Edmisten, Kristie L, PA   1 mg at 07/28/24 9192   HYDROmorphone  (DILAUDID ) tablet 2-4 mg  2-4 mg Oral Q4H PRN Edmisten, Kristie L, PA   4 mg at 07/28/24 0400   insulin  aspart (novoLOG ) injection 0-15 Units  0-15 Units Subcutaneous TID WC Edmisten, Kristie  L, PA       insulin  aspart (novoLOG ) injection 0-5 Units  0-5 Units Subcutaneous QHS Edmisten, Kristie L, PA       magnesium  citrate solution 1 Bottle  1 Bottle Oral Once PRN Edmisten, Kristie L, PA       menthol -cetylpyridinium (CEPACOL) lozenge 3 mg  1 lozenge Oral PRN Edmisten, Kristie L, PA       Or   phenol (CHLORASEPTIC) mouth spray 1 spray  1 spray Mouth/Throat PRN Edmisten, Kristie L, PA       methocarbamol  (ROBAXIN ) tablet 500 mg  500 mg Oral Q6H PRN Edmisten, Kristie L, PA   500 mg at 07/28/24 0359   Or   methocarbamol   (ROBAXIN ) injection 500 mg  500 mg Intravenous Q6H PRN Edmisten, Kristie L, PA       metoCLOPramide  (REGLAN ) tablet 5-10 mg  5-10 mg Oral Q8H PRN Edmisten, Kristie L, PA       Or   metoCLOPramide  (REGLAN ) injection 5-10 mg  5-10 mg Intravenous Q8H PRN Edmisten, Kristie L, PA   10 mg at 07/28/24 0357   ondansetron  (ZOFRAN ) tablet 4 mg  4 mg Oral Q6H PRN Edmisten, Kristie L, PA       Or   ondansetron  (ZOFRAN ) injection 4 mg  4 mg Intravenous Q6H PRN Edmisten, Kristie L, PA   4 mg at 07/27/24 2352   pantoprazole  (PROTONIX ) EC tablet 40 mg  40 mg Oral Daily Edmisten, Kristie L, PA   40 mg at 07/28/24 0917   polyethylene glycol (MIRALAX  / GLYCOLAX ) packet 17 g  17 g Oral Daily PRN Edmisten, Kristie L, PA       potassium chloride  (KLOR-CON  M) CR tablet 10 mEq  10 mEq Oral Daily Edmisten, Kristie L, PA   10 mEq at 07/28/24 9082   predniSONE  (STERAPRED UNI-PAK 21 TAB) tablet 10 mg  10 mg Oral Nightly Edmisten, Kristie L, PA       [START ON 07/29/2024] predniSONE  (STERAPRED UNI-PAK 21 TAB) tablet 10 mg  10 mg Oral Nightly Edmisten, Kristie L, PA       predniSONE  (STERAPRED UNI-PAK 21 TAB) tablet 5 mg  5 mg Oral PC lunch Edmisten, Kristie L, PA       predniSONE  (STERAPRED UNI-PAK 21 TAB) tablet 5 mg  5 mg Oral PC supper Edmisten, Kristie L, PA       [START ON 07/29/2024] predniSONE  (STERAPRED UNI-PAK 21 TAB) tablet 5 mg  5 mg Oral 3 x daily with food Edmisten, Kristie L, PA       [START ON 07/30/2024] predniSONE  (STERAPRED UNI-PAK 21 TAB) tablet 5 mg  5 mg Oral 4X daily taper Edmisten, Kristie L, PA       rivaroxaban  (XARELTO ) tablet 10 mg  10 mg Oral Q breakfast Edmisten, Kristie L, PA   10 mg at 07/28/24 9193   torsemide  (DEMADEX ) tablet 20 mg  20 mg Oral Daily Edmisten, Kristie L, PA   20 mg at 07/28/24 9082     Discharge Medications: Please see discharge summary for a list of discharge medications.  Relevant Imaging Results:  Relevant Lab Results:   Additional Information SSN:  [id number]  Alfonse [name], RN

## 2024-07-28 NOTE — Telephone Encounter (Signed)
 Patient Product/process development scientist completed.    The patient is insured through [employer]/ [employer]. Patient has Medicare and is not eligible for a copay card, but may be able to apply for patient assistance or Medicare RX Payment Plan (Patient Must reach out to their plan, if eligible for payment plan), if available.    Ran test claim for Xarelto  20 mg and the current 30 day co-pay is $12.15.   This test claim was processed through [facility]- copay amounts may vary at other pharmacies due to pharmacy/plan contracts, or as the patient moves through the different stages of their insurance plan.     Reyes Sharps, CPHT Pharmacy Technician III Certified Patient Advocate [facility] Patient Advocate Team Direct Number: [phone]  Fax: [phone]

## 2024-07-29 DIAGNOSIS — M1612 Unilateral primary osteoarthritis, left hip: Secondary | ICD-10-CM | POA: Diagnosis not present

## 2024-07-29 LAB — CBC
HCT: 29.6 % — ABNORMAL LOW (ref 36.0–46.0)
Hemoglobin: 9.6 g/dL — ABNORMAL LOW (ref 12.0–15.0)
MCH: 31.6 pg (ref 26.0–34.0)
MCHC: 32.4 g/dL (ref 30.0–36.0)
MCV: 97.4 fL (ref 80.0–100.0)
Platelets: 207 K/uL (ref 150–400)
RBC: 3.04 MIL/uL — ABNORMAL LOW (ref 3.87–5.11)
RDW: 11.9 % (ref 11.5–15.5)
WBC: 11.6 K/uL — ABNORMAL HIGH (ref 4.0–10.5)
nRBC: 0 % (ref 0.0–0.2)

## 2024-07-29 LAB — GLUCOSE, CAPILLARY
Glucose-Capillary: 131 mg/dL — ABNORMAL HIGH (ref 70–99)
Glucose-Capillary: 165 mg/dL — ABNORMAL HIGH (ref 70–99)
Glucose-Capillary: 112 mg/dL — ABNORMAL HIGH (ref 70–99)
Glucose-Capillary: 177 mg/dL — ABNORMAL HIGH (ref 70–99)

## 2024-07-29 MED ORDER — PREDNISONE 5 MG (21) PO TBPK: ORAL_TABLET | ORAL | Status: DC

## 2024-07-29 NOTE — Progress Notes (Signed)
   Subjective: 2 Days Post-Op Procedure(s) (LRB): ARTHROPLASTY, HIP, TOTAL, ANTERIOR APPROACH (Left) Patient reports pain as mild.   Patient seen in rounds by Dr. Melodi. Patient is well, and has had no acute complaints or problems. Foley catheter to be removed this AM.   Objective: Vital signs in last 24 hours: Temp:  [97.5 F (36.4 C)-98.6 F (37 C)] 97.9 F (36.6 C) (08/22 0401) Pulse Rate:  [76-82] 77 (08/22 0401) Resp:  [15-18] 16 (08/22 0401) BP: (100-133)/(47-68) 131/68 (08/22 0401) SpO2:  [97 %-100 %] 97 % (08/22 0401)  Intake/Output from previous day:  Intake/Output Summary (Last 24 hours) at 07/29/2024 0750 Last data filed at 07/29/2024 0524 Gross per 24 hour  Intake 2098.68 ml  Output 3300 ml  Net -1201.32 ml    Intake/Output this shift: No intake/output data recorded.  Labs: Recent Labs    07/28/24 0352 07/29/24 0339  HGB 10.1* 9.6*   Recent Labs    07/28/24 0352 07/29/24 0339  WBC 10.9* 11.6*  RBC 3.23* 3.04*  HCT 31.0* 29.6*  PLT 239 207   Recent Labs    07/28/24 0352  NA 137  K 3.9  CL 106  CO2 26  BUN 13  CREATININE 0.70  GLUCOSE 168*  CALCIUM 8.8*   No results for input(s): LABPT, INR in the last 72 hours.  Exam: General - Patient is Alert and Oriented Extremity - Neurologically intact Neurovascular intact Sensation intact distally Dorsiflexion/Plantar flexion intact Dressing/Incision - clean, dry, no drainage Motor Function - intact, moving foot and toes well on exam.   Past Medical History:  Diagnosis Date   Arthritis    back, hips   DDD (degenerative disc disease), lumbar    Diverticulitis    under control   Eczema    Family history of adverse reaction to anesthesia    sister and daughter very slow to wake up   GERD (gastroesophageal reflux disease)    Heart murmur    History of kidney stones    Neuromuscular disorder (HCC)    Rt leg    Pneumonia    as child   PONV (postoperative nausea and vomiting)     Pre-diabetes    Sciatica    Scoliosis     Assessment/Plan: 2 Days Post-Op Procedure(s) (LRB): ARTHROPLASTY, HIP, TOTAL, ANTERIOR APPROACH (Left) Principal Problem:   OA (osteoarthritis) of hip Active Problems:   Primary osteoarthritis of left hip  Estimated body mass index is 33.84 kg/m as calculated from the following:   Height as of this encounter: 5' 2 (1.575 m).   Weight as of this encounter: 83.9 kg. Up with therapy  DVT Prophylaxis - Xarelto  Weight-bearing as tolerated  Discharge to SNF once bed available.  Roxie Mess, PA-C Orthopedic Surgery [phone] 07/29/2024, 7:50 AM

## 2024-07-29 NOTE — Discharge Summary (Signed)
 Physician Discharge Summary   Patient ID: RITU GAGLIARDO MRN: [id number] DOB/AGE: Sep 20, 1946 78 y.o.  Admit date: 07/27/2024 Discharge date: 07/29/2024  Primary Diagnosis: Osteoarthritis, left hip    Admission Diagnoses:  Past Medical History:  Diagnosis Date   Arthritis    back, hips   DDD (degenerative disc disease), lumbar    Diverticulitis    under control   Eczema    Family history of adverse reaction to anesthesia    sister and daughter very slow to wake up   GERD (gastroesophageal reflux disease)    Heart murmur    History of kidney stones    Neuromuscular disorder (HCC)    Rt leg    Pneumonia    as child   PONV (postoperative nausea and vomiting)    Pre-diabetes    Sciatica    Scoliosis    Discharge Diagnoses:   Principal Problem:   OA (osteoarthritis) of hip Active Problems:   Primary osteoarthritis of left hip  Estimated body mass index is 33.84 kg/m as calculated from the following:   Height as of this encounter: 5' 2 (1.575 m).   Weight as of this encounter: 83.9 kg.  Procedure:  Procedure(s) (LRB): ARTHROPLASTY, HIP, TOTAL, ANTERIOR APPROACH (Left)   Consults: None  HPI: Tracey Morgan is a 78 y.o. female who has advanced end-  stage arthritis of their Left  hip with progressively worsening pain and  dysfunction.The patient has failed nonoperative management and presents for  total hip arthroplasty.   Laboratory Data: Admission on 07/27/2024  Component Date Value Ref Range Status   Glucose-Capillary 07/27/2024 144 (H)  70 - 99 mg/dL Final   Glucose reference range applies only to samples taken after fasting for at least 8 hours.   WBC 07/28/2024 10.9 (H)  4.0 - 10.5 K/uL Final   RBC 07/28/2024 3.23 (L)  3.87 - 5.11 MIL/uL Final   Hemoglobin 07/28/2024 10.1 (L)  12.0 - 15.0 g/dL Final   HCT 91/78/7974 31.0 (L)  36.0 - 46.0 % Final   MCV 07/28/2024 96.0  80.0 - 100.0 fL Final   MCH 07/28/2024 31.3  26.0 - 34.0 pg Final   MCHC 07/28/2024 32.6   30.0 - 36.0 g/dL Final   RDW 91/78/7974 11.9  11.5 - 15.5 % Final   Platelets 07/28/2024 239  150 - 400 K/uL Final   nRBC 07/28/2024 0.0  0.0 - 0.2 % Final   Performed at [facility], [address] [place], [place] [address]   Sodium 07/28/2024 137  135 - 145 mmol/L Final   Potassium 07/28/2024 3.9  3.5 - 5.1 mmol/L Final   Chloride 07/28/2024 106  98 - 111 mmol/L Final   CO2 07/28/2024 26  22 - 32 mmol/L Final   Glucose, Bld 07/28/2024 168 (H)  70 - 99 mg/dL Final   Glucose reference range applies only to samples taken after fasting for at least 8 hours.   BUN 07/28/2024 13  8 - 23 mg/dL Final   Creatinine, Ser 07/28/2024 0.70  0.44 - 1.00 mg/dL Final   Calcium 91/78/7974 8.8 (L)  8.9 - 10.3 mg/dL Final   GFR, Estimated 07/28/2024 >60  >60 mL/min Final   Comment: (NOTE) Calculated using the CKD-EPI Creatinine Equation (2021)    Anion gap 07/28/2024 5  5 - 15 Final   Performed at [facility], [address], [place], [place] [address]   Glucose-Capillary 07/27/2024 215 (H)  70 - 99 mg/dL  Final   Glucose reference range applies only to samples taken after fasting for at least 8 hours.   Glucose-Capillary 07/28/2024 174 (H)  70 - 99 mg/dL Final   Glucose reference range applies only to samples taken after fasting for at least 8 hours.   Glucose-Capillary 07/28/2024 119 (H)  70 - 99 mg/dL Final   Glucose reference range applies only to samples taken after fasting for at least 8 hours.   Glucose-Capillary 07/28/2024 135 (H)  70 - 99 mg/dL Final   Glucose reference range applies only to samples taken after fasting for at least 8 hours.   WBC 07/29/2024 11.6 (H)  4.0 - 10.5 K/uL Final   RBC 07/29/2024 3.04 (L)  3.87 - 5.11 MIL/uL Final   Hemoglobin 07/29/2024 9.6 (L)  12.0 - 15.0 g/dL Final   HCT 91/77/7974 29.6 (L)  36.0 - 46.0 % Final   MCV 07/29/2024 97.4  80.0 - 100.0 fL Final   MCH 07/29/2024 31.6  26.0 - 34.0 pg Final   MCHC 07/29/2024 32.4   30.0 - 36.0 g/dL Final   RDW 91/77/7974 11.9  11.5 - 15.5 % Final   Platelets 07/29/2024 207  150 - 400 K/uL Final   nRBC 07/29/2024 0.0  0.0 - 0.2 % Final   Performed at [facility], [address] [place], [place] [address]   Glucose-Capillary 07/28/2024 177 (H)  70 - 99 mg/dL Final   Glucose reference range applies only to samples taken after fasting for at least 8 hours.   Glucose-Capillary 07/28/2024 155 (H)  70 - 99 mg/dL Final   Glucose reference range applies only to samples taken after fasting for at least 8 hours.   Glucose-Capillary 07/29/2024 131 (H)  70 - 99 mg/dL Final   Glucose reference range applies only to samples taken after fasting for at least 8 hours.  Hospital Outpatient Visit on 07/14/2024  Component Date Value Ref Range Status   MRSA, PCR 07/14/2024 NEGATIVE  NEGATIVE Final   Staphylococcus aureus 07/14/2024 NEGATIVE  NEGATIVE Final   Comment: (NOTE) The Xpert SA Assay (FDA approved for NASAL specimens in patients 73 years of age and older), is one component of a comprehensive surveillance program. It is not intended to diagnose infection nor to guide or monitor treatment. Performed at [facility], [address] [place], [place] [address]    Hgb A1c MFr Bld 07/14/2024 6.4 (H)  4.8 - 5.6 % Final   Comment: (NOTE)         Prediabetes: 5.7 - 6.4         Diabetes: >6.4         Glycemic control for adults with diabetes: <7.0    Mean Plasma Glucose 07/14/2024 137  mg/dL Final   Comment: (NOTE) Performed At: [facility] [address] [place], [place] [id number] Jennette Shorter MD Ey:[id number]    Sodium 07/14/2024 140  135 - 145 mmol/L Final   Potassium 07/14/2024 3.5  3.5 - 5.1 mmol/L Final   Chloride 07/14/2024 97 (L)  98 - 111 mmol/L Final   CO2 07/14/2024 29  22 - 32 mmol/L Final   Glucose, Bld 07/14/2024 157 (H)  70 - 99 mg/dL Final   Glucose reference range applies only to samples taken after fasting  for at least 8 hours.   BUN 07/14/2024 21  8 - 23 mg/dL Final   Creatinine, Ser 07/14/2024 0.99  0.44 - 1.00 mg/dL Final   Calcium 91/92/7974 10.0  8.9 - 10.3 mg/dL Final   GFR, Estimated 07/14/2024 59 (L)  >60 mL/min Final   Comment: (NOTE) Calculated using the CKD-EPI Creatinine Equation (2021)    Anion gap 07/14/2024 14  5 - 15 Final   Performed at [facility], [address], [place], [place] [address]   WBC 07/14/2024 5.6  4.0 - 10.5 K/uL Final   RBC 07/14/2024 4.74  3.87 - 5.11 MIL/uL Final   Hemoglobin 07/14/2024 14.7  12.0 - 15.0 g/dL Final   HCT 91/92/7974 44.8  36.0 - 46.0 % Final   MCV 07/14/2024 94.5  80.0 - 100.0 fL Final   MCH 07/14/2024 31.0  26.0 - 34.0 pg Final   MCHC 07/14/2024 32.8  30.0 - 36.0 g/dL Final   RDW 91/92/7974 11.9  11.5 - 15.5 % Final   Platelets 07/14/2024 338  150 - 400 K/uL Final   nRBC 07/14/2024 0.0  0.0 - 0.2 % Final   Performed at [facility], [address] [place], [place] [address]   ABO/RH(D) 07/14/2024 A POS   Final   Antibody Screen 07/14/2024 NEG   Final   Sample Expiration 07/14/2024 07/28/2024,2359   Final   Extend sample reason 07/14/2024    Final                   Value:NO TRANSFUSIONS OR PREGNANCY IN THE PAST 3 MONTHS Performed at [facility], [address] [place], [place] [address]      X-Rays:DG Pelvis Portable Result Date: 07/27/2024 CLINICAL DATA:  Status post hip replacement. EXAM: PORTABLE PELVIS 1-2 VIEWS COMPARISON:  None Available. FINDINGS: Left hip arthroplasty in expected alignment. No periprosthetic lucency or fracture. Recent postsurgical change includes air and edema in the soft tissues. Previous right hip arthroplasty. IMPRESSION: Left hip arthroplasty without immediate postoperative complication. Electronically Signed   By: Andrea Gasman M.D.   On: 07/27/2024 15:43   DG HIP PORT UNILAT WITH PELVIS 1V LEFT Result Date: 07/27/2024 CLINICAL DATA:   [id number] Surgery, elective [id number]. EXAM: DG HIP (WITH OR WITHOUT PELVIS) 1V PORT LEFT COMPARISON:  None Available. FINDINGS: Several intraoperative radiographs during left hip arthroplasty are submitted for review. On the final image, there is left hip arthroplasty hardware which appears in anatomic position. Please refer to the separately issued operative report for further details. Fluoroscopy time: 5 seconds Radiation dose: 0.607 mGy IMPRESSION: Intraoperative fluoroscopic guidance, as described above. Electronically Signed   By: Ree Molt M.D.   On: 07/27/2024 13:42   DG C-Arm 1-60 Min-No Report Result Date: 07/27/2024 Fluoroscopy was utilized by the requesting physician.  No radiographic interpretation.   DG C-Arm 1-60 Min-No Report Result Date: 07/27/2024 Fluoroscopy was utilized by the requesting physician.  No radiographic interpretation.    EKG: Orders placed or performed during the hospital encounter of 07/14/24   EKG 12 lead per protocol   EKG 12 lead per protocol     Hospital Course: BLANCA CARREON is a 78 y.o. who was admitted to [facility]. They were brought to the operating room on 07/27/2024 and underwent Procedure(s): ARTHROPLASTY, HIP, TOTAL, ANTERIOR APPROACH.  Patient tolerated the procedure well and was later transferred to the recovery room and then to the orthopaedic floor for postoperative care. They were given PO and IV analgesics for pain control following their surgery. They were given 24 hours of postoperative antibiotics of  Anti-infectives (From admission, onward)    Start     Dose/Rate  Route Frequency Ordered Stop   07/27/24 1800  ceFAZolin  (ANCEF ) IVPB 2g/100 mL premix        2 g 200 mL/hr over 30 Minutes Intravenous Every 6 hours 07/27/24 1701 07/28/24 0100   07/27/24 0830  ceFAZolin  (ANCEF ) IVPB 2g/100 mL premix        2 g 200 mL/hr over 30 Minutes Intravenous On call to O.R. 07/27/24 9177 07/27/24 1230      and started on DVT prophylaxis in  the form of Xarelto .   PT and OT were ordered for total joint protocol. Discharge planning consulted to help with postop disposition and equipment needs. Patient had a decent night on the evening of surgery. They started to get up OOB with therapy on POD #1. Continued to work with therapy throughout hospital stay. She was discharged to SNF in stable condition.  Diet: Regular diet Activity: WBAT Follow-up: in 2 weeks Disposition: Skilled nursing facility Discharged Condition: stable   Discharge Instructions     Call MD / Call 911   Complete by: As directed    If you experience chest pain or shortness of breath, CALL 911 and be transported to the hospital emergency room.  If you develope a fever above 101 F, pus (white drainage) or increased drainage or redness at the wound, or calf pain, call your surgeon's office.   Change dressing   Complete by: As directed    You have an adhesive waterproof bandage over the incision. Leave this in place until your first follow-up appointment. Once you remove this you will not need to place another bandage.   Constipation Prevention   Complete by: As directed    Drink plenty of fluids.  Prune juice may be helpful.  You may use a stool softener, such as Colace (over the counter) 100 mg twice a day.  Use MiraLax  (over the counter) for constipation as needed.   Diet - low sodium heart healthy   Complete by: As directed    Do not sit on low chairs, stoools or toilet seats, as it may be difficult to get up from low surfaces   Complete by: As directed    Driving restrictions   Complete by: As directed    No driving for two weeks   Post-operative opioid taper instructions:   Complete by: As directed    POST-OPERATIVE OPIOID TAPER INSTRUCTIONS: It is important to wean off of your opioid medication as soon as possible. If you do not need pain medication after your surgery it is ok to stop day one. Opioids include: Codeine, Hydrocodone (Norco, Vicodin),  Oxycodone(Percocet, oxycontin) and hydromorphone  amongst others.  Long term and even short term use of opiods can cause: Increased pain response Dependence Constipation Depression Respiratory depression And more.  Withdrawal symptoms can include Flu like symptoms Nausea, vomiting And more Techniques to manage these symptoms Hydrate well Eat regular healthy meals Stay active Use relaxation techniques(deep breathing, meditating, yoga) Do Not substitute Alcohol  to help with tapering If you have been on opioids for less than two weeks and do not have pain than it is ok to stop all together.  Plan to wean off of opioids This plan should start within one week post op of your joint replacement. Maintain the same interval or time between taking each dose and first decrease the dose.  Cut the total daily intake of opioids by one tablet each day Next start to increase the time between doses. The last dose that should be eliminated is the  evening dose.      TED hose   Complete by: As directed    Use stockings (TED hose) for three weeks on both leg(s).  You may remove them at night for sleeping.   Weight bearing as tolerated   Complete by: As directed       Allergies as of 07/29/2024       Reactions   Hydralazine Anaphylaxis, Nausea Only   Patient had nausea and headache with po hydralazine 10mg     Metformin Other (See Comments)   Severe colitis.   Metronidazole  Other (See Comments)   Not specified   Percocet [oxycodone-acetaminophen ]    Pt is unsure    Buprenorphine Hcl Nausea And Vomiting, Rash, Other (See Comments)   hallucinations   Codeine Nausea And Vomiting, Rash, Other (See Comments)   Hallucinations   Hydrocodone  Nausea And Vomiting, Rash   Morphine And Codeine Nausea And Vomiting, Rash, Other (See Comments)   hallucinations        Medication List     STOP taking these medications    albuterol  108 (90 Base) MCG/ACT inhaler Commonly known as: VENTOLIN  HFA    aspirin -acetaminophen -caffeine 250-250-65 MG tablet Commonly known as: EXCEDRIN MIGRAINE   B COMPLEX VITAMIN PO   Berberine Chloride 500 MG Caps   budesonide  0.5 MG/2ML nebulizer solution Commonly known as: Pulmicort    Cinnamon 500 MG capsule   Co Q-10 200 MG Caps   ELDERBERRY PO   HAIR SKIN NAILS GUMMIES PO   levocetirizine 5 MG tablet Commonly known as: XYZAL    Magnesium  Glycinate 120 MG Caps   MAGNESIUM  PO   OVER THE COUNTER MEDICATION   PRESERVISION AREDS 2+MULTI VIT PO   VITAMIN D-VITAMIN K PO       TAKE these medications    acetaminophen  500 MG tablet Commonly known as: TYLENOL  Take 500 mg by mouth every 6 (six) hours as needed for headache or moderate pain (pain score 4-6).   azelastine  0.1 % nasal spray Commonly known as: ASTELIN  USE 1 DOSE IN EACH NOSTRIL TWICE DAILY AS DIRECTED What changed: See the new instructions.   cetirizine  10 MG tablet Commonly known as: ZYRTEC  Take 1 tablet by mouth once daily   clobetasol ointment 0.05 % Commonly known as: TEMOVATE Apply 1 Application topically 2 (two) times daily as needed (irritation).   diphenhydrAMINE  25 MG tablet Commonly known as: BENADRYL  Take 25 mg by mouth every 6 (six) hours as needed for allergies or itching.   docusate sodium  100 MG capsule Commonly known as: COLACE Take 1 capsule (100 mg total) by mouth 2 (two) times daily.   EPINEPHrine  0.3 mg/0.3 mL Soaj injection Commonly known as: EPI-PEN Inject 0.3 mg into the muscle as needed for anaphylaxis.   famotidine  40 MG tablet Commonly known as: PEPCID  Take 40 mg by mouth at bedtime as needed for heartburn or indigestion.   Farxiga  5 MG Tabs tablet Generic drug: dapagliflozin  propanediol Take 10 mg by mouth daily.   fluticasone  50 MCG/ACT nasal spray Commonly known as: FLONASE  Place 2 sprays into both nostrils daily.   HYDROmorphone  2 MG tablet Commonly known as: DILAUDID  Take 1-2 tablets (2-4 mg total) by mouth every 6  (six) hours as needed for moderate pain (pain score 4-6) or severe pain (pain score 7-10).   methocarbamol  500 MG tablet Commonly known as: ROBAXIN  Take 1 tablet (500 mg total) by mouth every 6 (six) hours as needed for muscle spasms.   ondansetron  4 MG tablet Commonly known as:  ZOFRAN  Take 1 tablet (4 mg total) by mouth every 6 (six) hours as needed for nausea.   pantoprazole  40 MG tablet Commonly known as: PROTONIX  Take 40 mg by mouth daily.   polyethylene glycol 17 g packet Commonly known as: MIRALAX  / GLYCOLAX  Take 17 g by mouth daily as needed for mild constipation.   POTASSIUM PO Take 99 mg by mouth daily.   rivaroxaban  10 MG Tabs tablet Commonly known as: XARELTO  Take 1 tablet (10 mg total) by mouth daily with breakfast for 21 days.   torsemide  20 MG tablet Commonly known as: DEMADEX  Take 20 mg by mouth daily.   triamcinolone lotion 0.1 % Commonly known as: KENALOG Apply 1 Application topically 2 (two) times daily.               Discharge Care Instructions  (From admission, onward)           Start     Ordered   07/28/24 0000  Weight bearing as tolerated        07/28/24 0730   07/28/24 0000  Change dressing       Comments: You have an adhesive waterproof bandage over the incision. Leave this in place until your first follow-up appointment. Once you remove this you will not need to place another bandage.   07/28/24 0730            Follow-up Information     Melodi Lerner, MD. Schedule an appointment as soon as possible for a visit in 2 week(s).   Specialty: Orthopedic Surgery Contact information: [address] [place] [address] [phone]                 Signed: Roxie Mess, PA-C Orthopedic Surgery 07/29/2024, 7:51 AM

## 2024-07-29 NOTE — Progress Notes (Signed)
 Pt had just returned to bed upon arrival, requests use of BSC requiring assist to safely transfer. Pt given and reviewed HEP and recs for safe mobility. Session modified due to clearance to d/c to STR this pm, NT in to assist pt for d/c.   07/29/24 1600  PT Visit Information  Assistance Needed +1  History of Present Illness 78 y.o. female s/p L THA, anterior approach on 07/27/2024 due to failure of conservative measures,. Pt PMH includes but is not limited to: neuromuscular disorder, GERD, OA, back surgery and R THA (2022).  Subjective Data  Patient Stated Goal decrease pain  Precautions  Precautions Fall  Recall of Precautions/Restrictions Intact  Restrictions  Weight Bearing Restrictions Per Provider Order Yes  LLE Weight Bearing Per Provider Order WBAT  Pain Assessment  Pain Assessment 0-10  Pain Score 6  Pain Location L hip with movement, at rest 0/10  Pain Descriptors / Indicators Aching;Discomfort;Grimacing;Operative site guarding  Pain Intervention(s) Monitored during session  Cognition  Arousal Alert  Behavior During Therapy [facility] for tasks assessed/performed  PT - Cognitive impairments No apparent impairments  Following Commands  Following commands Intact  Cueing  Cueing Techniques Verbal cues;Gestural cues  Communication  Communication No apparent difficulties  Bed Mobility  Overal bed mobility Needs Assistance  Bed Mobility Supine to Sit  Supine to sit Min assist;HOB elevated;Used rails  Sit to supine Min assist;HOB elevated;Used rails  Transfers  Overall transfer level Needs assistance  Equipment used Rolling walker (2 wheels)  Transfers Sit to/from Stand  Sit to Stand Contact guard assist;From elevated surface  Ambulation/Gait  Ambulation/Gait assistance Contact guard assist  Gait Distance (Feet) 3 Feet  Assistive device Rolling walker (2 wheels)  Gait Pattern/deviations Step-to pattern;Antalgic;Trunk flexed  General Gait Details Short distance gait to [facility] due to  urine incontinence  Gait velocity decr  Balance  Overall balance assessment Needs assistance  Sitting-balance support Feet supported;No upper extremity supported  Sitting balance-Leahy Scale Fair  Standing balance support Bilateral upper extremity supported;Reliant on assistive device for balance;During functional activity  Standing balance-Leahy Scale Fair  General Comments  General comments (skin integrity, edema, etc.) L hip dressing dry and intact  Exercises  Exercises Other exercises  Other Exercises  Other Exercises  (Pt given HEP and safe recs for mobility, car transfers, and stair negotiation)  PT - End of Session  Equipment Utilized During Treatment Gait belt  Activity Tolerance Patient tolerated treatment well  Patient left Other (comment) (Left on BSC with NT to prepare for d/c to STR)  Nurse Communication Mobility status   PT - Assessment/Plan  PT Visit Diagnosis Unsteadiness on feet (R26.81);Pain;Difficulty in walking, not elsewhere classified (R26.2)  Pain - Right/Left Left  Pain - part of body Hip  PT Frequency (ACUTE ONLY) 7X/week  Follow Up Recommendations Skilled nursing-short term rehab (<3 hours/day)  Can patient physically be transported by private vehicle No  Patient can return home with the following Assistance with cooking/housework;Assist for transportation;Help with stairs or ramp for entrance;Two people to help with walking and/or transfers;A little help with bathing/dressing/bathroom  PT equipment None recommended by PT  AM-PAC PT 6 Clicks Mobility Outcome Measure (Version 2)  Help needed turning from your back to your side while in a flat bed without using bedrails? 3  Help needed moving from lying on your back to sitting on the side of a flat bed without using bedrails? 3  Help needed moving to and from a bed to a chair (including a wheelchair)?  2  Help needed standing up from a chair using your arms (e.g., wheelchair or bedside chair)? 3  Help  needed to walk in hospital room? 2  Help needed climbing 3-5 steps with a railing?  1  6 Click Score 14  Consider Recommendation of Discharge To: CIR/SNF/LTACH  Progressive Mobility  What is the highest level of mobility based on the mobility assessment? Level 4 (Ambulates with assistance) - Balance while stepping forward/back - Complete  Activity Ambulated with assistance  PT Goal Progression  Progress towards PT goals Progressing toward goals  PT Time Calculation  PT Start Time (ACUTE ONLY) 1500  PT Stop Time (ACUTE ONLY) 1515  PT Time Calculation (min) (ACUTE ONLY) 15 min  PT General Charges  $$ ACUTE PT VISIT 1 Visit  PT Treatments  $Therapeutic Activity 8-22 mins  Darice Bohr, PTA 07/29/2024

## 2024-07-29 NOTE — TOC Transition Note (Signed)
 Transition of Care [facility]) - Discharge Note   Patient Details  Name: LIBNI FUSARO MRN: [id number] Date of Birth: 1946/05/31  Transition of Care [facility]) CM/SW Contact:  Alfonse [name], RN Phone Number: 07/29/2024, 3:03 PM   Clinical Narrative:  Call from [facility] with [employer]. SNF request approved, auth# [id number], PTAR transport approved, auth# [id number], team notified. DC order and DC Summary in place, Whitney w/[name] notified. RM 104, cal lreport [phone] [id number]. No further TOC needs identified at this time.         Barriers to Discharge: Continued Medical Work up   Patient Goals and CMS Choice Patient states their goals for this hospitalization and ongoing recovery are:: SNF at [name] CMS Medicare.gov Compare Post Acute Care list provided to:: Patient Choice offered to / list presented to : Patient [place] ownership interest in [facility].provided to:: Patient    Discharge Placement                       Discharge Plan and Services Additional resources added to the After Visit Summary for                                       Social Drivers of Health (SDOH) Interventions SDOH Screenings   Food Insecurity: No Food Insecurity (07/27/2024)  Housing: Low Risk  (07/27/2024)  Transportation Needs: No Transportation Needs (07/27/2024)  Utilities: Not At Risk (07/27/2024)  Social Connections: Moderately Isolated (07/27/2024)  Tobacco Use: Low Risk  (07/27/2024)     Readmission Risk Interventions     No data to display

## 2024-07-29 NOTE — Progress Notes (Signed)
 Patient refused removal of foley catheter stating her mobility is reduced and she did not want to get out of bad and also she does not like to use a bedpan.

## 2024-07-29 NOTE — Progress Notes (Signed)
 Good progression with gait training this am after pre-medicating prior to session. Pt with heavy reliance on RW to offset L LE pain. Vc's given for sequencing and wt shifting. Pt positioned in chair to comfort post session.   07/29/24 1100  PT Visit Information  Assistance Needed +1  History of Present Illness 78 y.o. female s/p L THA, anterior approach on 07/27/2024 due to failure of conservative measures,. Pt PMH includes but is not limited to: neuromuscular disorder, GERD, OA, back surgery and R THA (2022).  Subjective Data  Patient Stated Goal decrease pain  Precautions  Precautions Fall  Recall of Precautions/Restrictions Intact  Restrictions  Weight Bearing Restrictions Per Provider Order Yes  LLE Weight Bearing Per Provider Order WBAT  Pain Assessment  Pain Assessment 0-10  Pain Score 6  Pain Location L hip with movement, at rest 0/10  Pain Descriptors / Indicators Aching;Discomfort;Grimacing;Operative site guarding  Pain Intervention(s) Premedicated before session  Cognition  Arousal Alert  Behavior During Therapy [facility] for tasks assessed/performed  PT - Cognitive impairments No apparent impairments  Following Commands  Following commands Intact  Cueing  Cueing Techniques Verbal cues;Gestural cues  Communication  Communication No apparent difficulties  Bed Mobility  Overal bed mobility Needs Assistance  Bed Mobility Supine to Sit;Sit to Supine  Supine to sit Min assist;HOB elevated;Used rails  Sit to supine Min assist;HOB elevated;Used rails  General bed mobility comments  (Simulated leg lifter with gait belt to self assist L LE during bed mobility)  Transfers  Overall transfer level Needs assistance  Equipment used Rolling walker (2 wheels)  Transfers Sit to/from Stand  Sit to Stand Contact guard assist;From elevated surface  General transfer comment steady and stable transfer.  Ambulation/Gait  Ambulation/Gait assistance Contact guard assist  Gait Distance (Feet) 10  Feet  Assistive device Rolling walker (2 wheels)  Gait Pattern/deviations Step-to pattern;Antalgic;Trunk flexed  General Gait Details Pt able to progress gait in room followed with chair  Gait velocity decr  Balance  Overall balance assessment Needs assistance  Sitting-balance support Feet supported;No upper extremity supported  Sitting balance-Leahy Scale Fair  Standing balance support Bilateral upper extremity supported;Reliant on assistive device for balance;During functional activity  Standing balance-Leahy Scale Fair  Standing balance comment  (L hip pain limiting function)  Exercises  Exercises Total Joint  Total Joint Exercises  Ankle Circles/Pumps AROM;20 reps;Both;Supine  Heel Slides AAROM;Left;5 reps;Supine  Long Arc Quad AROM;Both;10 reps  PT - End of Session  Equipment Utilized During Treatment Gait belt  Activity Tolerance Patient tolerated treatment well  Patient left in chair  Nurse Communication Mobility status   PT - Assessment/Plan  PT Visit Diagnosis Unsteadiness on feet (R26.81);Pain;Difficulty in walking, not elsewhere classified (R26.2)  Pain - Right/Left Left  Pain - part of body Hip  PT Frequency (ACUTE ONLY) 7X/week  Follow Up Recommendations Skilled nursing-short term rehab (<3 hours/day)  Can patient physically be transported by private vehicle No  Patient can return home with the following Assistance with cooking/housework;Assist for transportation;Help with stairs or ramp for entrance;Two people to help with walking and/or transfers;A little help with bathing/dressing/bathroom  PT equipment None recommended by PT  AM-PAC PT 6 Clicks Mobility Outcome Measure (Version 2)  Help needed turning from your back to your side while in a flat bed without using bedrails? 3  Help needed moving from lying on your back to sitting on the side of a flat bed without using bedrails? 3  Help needed moving to and from a  bed to a chair (including a wheelchair)? 2  Help  needed standing up from a chair using your arms (e.g., wheelchair or bedside chair)? 3  Help needed to walk in hospital room? 2  Help needed climbing 3-5 steps with a railing?  1  6 Click Score 14  Consider Recommendation of Discharge To: CIR/SNF/LTACH  Progressive Mobility  What is the highest level of mobility based on the mobility assessment? Level 4 (Ambulates with assistance) - Balance while stepping forward/back - Complete  Activity Ambulated with assistance  PT Goal Progression  Progress towards PT goals Progressing toward goals  PT Time Calculation  PT Start Time (ACUTE ONLY) 1029  PT Stop Time (ACUTE ONLY) 1100  PT Time Calculation (min) (ACUTE ONLY) 31 min  PT General Charges  $$ ACUTE PT VISIT 1 Visit  PT Treatments  $Gait Training 8-22 mins  $Therapeutic Exercise 8-22 mins  Darice Bohr, PTA

## 2024-09-07 NOTE — Therapy (Signed)
 OUTPATIENT PHYSICAL THERAPY LOWER EXTREMITY EVALUATION   Patient Name: Tracey Morgan MRN: [id number] DOB:1946/06/23, 78 y.o., female Today's Date: 09/08/2024   END OF SESSION:  PT End of Session - 09/08/24 1321     Visit Number 1    Date for Recertification  11/03/24    PT Start Time 1318    Activity Tolerance Patient tolerated treatment well;Patient limited by lethargy    Behavior During Therapy [facility] for tasks assessed/performed          Past Medical History:  Diagnosis Date   Arthritis    back, hips   DDD (degenerative disc disease), lumbar    Diverticulitis    under control   Eczema    Family history of adverse reaction to anesthesia    sister and daughter very slow to wake up   GERD (gastroesophageal reflux disease)    Heart murmur    History of kidney stones    Neuromuscular disorder (HCC)    Rt leg    Pneumonia    as child   PONV (postoperative nausea and vomiting)    Pre-diabetes    Sciatica    Scoliosis    Past Surgical History:  Procedure Laterality Date   ADENOIDECTOMY     APPENDECTOMY  1979   BACK SURGERY  1981   CATARACT EXTRACTION Left    CESAREAN SECTION  1981   CHOLECYSTECTOMY  1972   COLON SURGERY  2021   sigmoidectomy   GALLBLADDER SURGERY  1979   HIP SURGERY Right 03/2021   TONSILLECTOMY     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   Procedure: TOTAL HIP ARTHROPLASTY ANTERIOR APPROACH;  Surgeon: Melodi Lerner, MD;  Location: [facility];  Service: Orthopedics;  Laterality: Right;    TOTAL HIP ARTHROPLASTY Left 07/27/2024   Procedure: ARTHROPLASTY, HIP, TOTAL, ANTERIOR APPROACH;  Surgeon: Melodi Lerner, MD;  Location: [facility];  Service: Orthopedics;  Laterality: Left;   Patient Active Problem List   Diagnosis Date Noted   Primary osteoarthritis of left hip 07/27/2024   OA (osteoarthritis) of hip 03/27/2021   Primary osteoarthritis of right hip 03/27/2021   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    PCP: Beverley Norleen NOVAK, MD   REFERRING PROVIDER: Melodi Lerner, MD   REFERRING DIAG: ***  THERAPY DIAG:  Pain in left hip  Sciatica, left side  Other abnormalities of gait and mobility  Muscle weakness (generalized)  RATIONALE FOR EVALUATION AND TREATMENT: Rehabilitation  ONSET DATE: 07/27/24  NEXT MD VISIT:    SUBJECTIVE:  SUBJECTIVE STATEMENT: Patient is a 78 y/o referred to PT following a anterior L THA by Dr Hiram on 07/27/24.  Orders are 1x/week x 4 weeks per referral from Dr Hiram.   Patient had several weeks of SNF rehab and also [facility] PT, but is finished with these services.   Called [facility] and requested that they D/C patient from [facility] services since she has now started outpatient PT.   Patient used a cane at baseline prior to surgery, and has only just started using a cane again in the last few days.   She has been walker dependent to date.   Her biggest c/o is posterior L hip pain/sciatica which she states has been long standing for years, but is much worse since the surgery.  She really has no anterior hip pain or thigh pain.   All of her pain is radicular in nature from the low back down the posterolateral thigh  to knee.   She lives alone and normally functions independently with all ADL  PAIN: Are you having pain? Yes: NPRS scale: 4/10;  6/10 worst Pain location: L posterior hip/sciatic pain Pain description: burning, radiating down the leg Aggravating factors: prolonged standing, bending, lifting Relieving factors: lying on R side w/ pillows between knees  PERTINENT HISTORY:  R THA 2022, remote h/o back surgery (no fusion); scoliosis, chronic back pain, pre-diabetes, DDD  PRECAUTIONS: Anterior hip  RED FLAGS: None  WEIGHT BEARING RESTRICTIONS: No  FALLS:  Has patient fallen in last  6 months? No  LIVING ENVIRONMENT: Lives with: lives alone Lives in: House/apartment Stairs: Yes: External: 3 steps; on left going up Has following equipment at home: Single point cane  OCCUPATION: retired Veterinary surgeon  PLOF: Independent with gait  PATIENT GOALS: get back to where I was presurgery, do housework, go shopping, get my groceries, etc.   OBJECTIVE: (objective measures completed at initial evaluation unless otherwise dated)  DIAGNOSTIC FINDINGS:  ***  PATIENT SURVEYS:  LEFS  Extreme difficulty/unable (0), Quite a bit of difficulty (1), Moderate difficulty (2), Little difficulty (3), No difficulty (4) Survey date:    Any of your usual work, housework or school activities   2. Usual hobbies, recreational or sporting activities   3. Getting into/out of the bath   4. Walking between rooms   5. Putting on socks/shoes   6. Squatting    7. Lifting an object, like a bag of groceries from the floor   8. Performing light activities around your home   9. Performing heavy activities around your home   10. Getting into/out of a car   11. Walking 2 blocks   12. Walking 1 mile   13. Going up/down 10 stairs (1 flight)   14. Standing for 1 hour   15.  sitting for 1 hour   16. Running on even ground   17. Running on uneven ground   18. Making sharp turns while running fast   19. Hopping    20. Rolling over in bed   Score total:  ***     COGNITION: Overall cognitive status: Within functional limits for tasks assessed    SENSATION: {sensation:27233}  EDEMA:  {edema:24020}  POSTURE:  {posture:25561}  PALPATION: ***  MUSCLE LENGTH: Hamstrings: Right *** deg; Left *** deg Debby test: Right *** deg; Left *** deg Hamstrings: *** ITB: *** Piriformis: *** Hip flexors: *** Quads: *** Heelcord: ***  LOWER EXTREMITY ROM:  {AROM/PROM:27142} ROM Right eval Left eval  Hip flexion  Hip extension    Hip abduction    Hip adduction    Hip internal rotation    Hip  external rotation    Knee flexion    Knee extension    Ankle dorsiflexion    Ankle plantarflexion    Ankle inversion    Ankle eversion     {AROM/PROM:27142} ROM Right eval Left eval  Hip flexion    Hip extension    Hip abduction    Hip adduction    Hip internal rotation    Hip external rotation    Knee flexion    Knee extension    Ankle dorsiflexion    Ankle plantarflexion    Ankle inversion    Ankle eversion    (Blank rows = not tested)  LOWER EXTREMITY MMT:  MMT Right eval Left eval  Hip flexion 5 4-  Hip extension    Hip abduction 3- NT  Hip adduction    Hip internal rotation 4- 5  Hip external rotation 4 5  Knee flexion 4 5  Knee extension 4 5  Ankle dorsiflexion    Ankle plantarflexion    Ankle inversion    Ankle eversion     (Blank rows = not tested)   FUNCTIONAL TESTS:  {Functional tests:24029}  GAIT: Distance walked: *** Assistive device utilized: {Assistive devices:23999} Level of assistance: {Levels of assistance:24026} Gait pattern: {gait characteristics:25376} Comments: ***   5x STS = 15.81 sec TUG = 17.88 sec with cane  TODAY'S TREATMENT:   ***   PATIENT EDUCATION:  Education details: PT eval findings, anticipated POC, and initial HEP  Person educated: Patient Education method: Explanation, Demonstration, Verbal cues, Tactile cues, and Handouts Education comprehension: verbalized understanding, verbal cues required, tactile cues required, and needs further education  HOME EXERCISE PROGRAM: Access Code: [email or web address] URL: [email or web address] Date: 09/08/2024 Prepared by: Garnette Montclair  Exercises - Clam with Resistance  - 1 x daily - 7 x weekly - 2 sets - 10 reps - Bridge with Resistance  - 1 x daily - 7 x weekly - 2 sets - 10 reps - Forward Step Up with Counter Support  - 1 x daily - 7 x weekly - 1 sets - 10 reps - Lateral Step Up with Counter Support  - 1 x daily - 7 x weekly - 1 sets - 10  reps   ASSESSMENT:  CLINICAL IMPRESSION: Tracey Morgan is a 78 y.o. female who was referred to physical therapy for evaluation and treatment for ***.  ***   Patient reports onset of *** pain beginning ***. Pain is worse with ***.  Patient has deficits in *** ROM, *** LE flexibility, *** strength, ***abnormal posture, and TTP with abnormal muscle tension *** which are interfering with ADLs and are impacting quality of life.  On LEFS patient scored ***/80 demonstrating *** functional limitation.  Tracey Morgan will benefit from skilled PT to address above deficits to improve mobility and activity tolerance with decreased pain interference.  OBJECTIVE IMPAIRMENTS: Abnormal gait, difficulty walking, decreased strength, and pain.   ACTIVITY LIMITATIONS: carrying, lifting, bending, standing, squatting, sleeping, and locomotion level  PARTICIPATION LIMITATIONS: {participationrestrictions:25113}  PERSONAL FACTORS: {Personal factors:25162} are also affecting patient's functional outcome.   REHAB POTENTIAL: {rehabpotential:25112}  CLINICAL DECISION MAKING: {clinical decision making:25114}  EVALUATION COMPLEXITY: {Evaluation complexity:25115}   GOALS: Goals reviewed with patient? {yes/no:20286}  SHORT TERM GOALS: Target date: ***  Patient will be independent with initial HEP. Baseline: *** Goal status: {GOALSTATUS:25110}  2.  Patient will report at  least 25% improvement in *** hip pain to improve QOL. Baseline: *** Goal status: {GOALSTATUS:25110}  3.  *** Baseline: *** Goal status: {GOALSTATUS:25110}  LONG TERM GOALS: Target date: ***  Patient will be independent with advanced/ongoing HEP to improve outcomes and carryover.  Baseline: *** Goal status: {GOALSTATUS:25110}  2.  Patient will report at least 50-75% improvement in *** hip pain to improve QOL. Baseline: *** Goal status: {GOALSTATUS:25110}  3.  Patient will demonstrate improved *** strength to >/= ***/5 for improved stability  and ease of mobility. Baseline: Refer to above LE MMT table Goal status: {GOALSTATUS:25110}  4.  Patient will be able to ambulate 600' with LRAD and normal gait pattern without increased pain to access community.  Baseline: *** Goal status: {GOALSTATUS:25110}  5.  Patient will report >/= ***/80 on LEFS (MCID = 9 pts) to demonstrate improved functional ability. Baseline: *** Goal status: {GOALSTATUS:25110}  6.  Patient will demonstrate at least 19/24 on DGI to decrease risk of falls. Baseline: *** Goal status: {GOALSTATUS:25110}    PLAN:  PT FREQUENCY: 1-2x/week  PT DURATION: 4 weeks  PLANNED INTERVENTIONS: 97164- PT Re-evaluation, 97750- Physical Performance Testing, 97110-Therapeutic exercises, 97530- Therapeutic activity, [id number]- Neuromuscular re-education, 97535- Self Care, 02859- Manual therapy, [id number]- Gait training, [id number]- Electrical stimulation (unattended), 97016- Vasopneumatic device, 97035- Ultrasound, 02966- Ionotophoresis 4mg /ml Dexamethasone , Patient/Family education, Balance training, Stair training, Taping, Spinal mobilization, Cryotherapy, and Moist heat  PLAN FOR NEXT SESSION: theragun and/or foam roll massage for L hip sciatica   Yarelin Reichardt, PT 09/08/2024, 9:38 PM

## 2024-09-08 ENCOUNTER — Ambulatory Visit: Attending: Orthopedic Surgery | Admitting: Rehabilitation

## 2024-09-08 ENCOUNTER — Encounter: Payer: Self-pay | Admitting: Rehabilitation

## 2024-09-08 DIAGNOSIS — M25552 Pain in left hip: Secondary | ICD-10-CM | POA: Insufficient documentation

## 2024-09-08 DIAGNOSIS — R2689 Other abnormalities of gait and mobility: Secondary | ICD-10-CM | POA: Insufficient documentation

## 2024-09-08 DIAGNOSIS — M5432 Sciatica, left side: Secondary | ICD-10-CM | POA: Insufficient documentation

## 2024-09-08 DIAGNOSIS — M6281 Muscle weakness (generalized): Secondary | ICD-10-CM | POA: Diagnosis present

## 2024-09-14 ENCOUNTER — Encounter: Payer: Self-pay | Admitting: Rehabilitation

## 2024-09-14 ENCOUNTER — Ambulatory Visit: Admitting: Rehabilitation

## 2024-09-14 DIAGNOSIS — R2689 Other abnormalities of gait and mobility: Secondary | ICD-10-CM

## 2024-09-14 DIAGNOSIS — M25552 Pain in left hip: Secondary | ICD-10-CM

## 2024-09-14 DIAGNOSIS — M5432 Sciatica, left side: Secondary | ICD-10-CM

## 2024-09-14 DIAGNOSIS — M6281 Muscle weakness (generalized): Secondary | ICD-10-CM

## 2024-09-14 NOTE — Therapy (Signed)
 OUTPATIENT PHYSICAL THERAPY LOWER EXTREMITY EVALUATION   Patient Name: Tracey Morgan MRN: [id number] DOB:Oct 11, 1946, 78 y.o., female Today's Date: 09/14/2024   END OF SESSION:  PT End of Session - 09/14/24 1404     Visit Number 2    Date for Recertification  11/03/24    PT Start Time 1401    PT Stop Time 1500    PT Time Calculation (min) 59 min    Activity Tolerance Patient tolerated treatment well;Patient limited by lethargy    Behavior During Therapy WFL for tasks assessed/performed          Past Medical History:  Diagnosis Date   Arthritis    back, hips   DDD (degenerative disc disease), lumbar    Diverticulitis    under control   Eczema    Family history of adverse reaction to anesthesia    sister and daughter very slow to wake up   GERD (gastroesophageal reflux disease)    Heart murmur    History of kidney stones    Neuromuscular disorder (HCC)    Rt leg    Pneumonia    as child   PONV (postoperative nausea and vomiting)    Pre-diabetes    Sciatica    Scoliosis    Past Surgical History:  Procedure Laterality Date   ADENOIDECTOMY     APPENDECTOMY  1979   BACK SURGERY  1981   CATARACT EXTRACTION Left    CESAREAN SECTION  1981   CHOLECYSTECTOMY  1972   COLON SURGERY  2021   sigmoidectomy   GALLBLADDER SURGERY  1979   HIP SURGERY Right 03/2021   TONSILLECTOMY     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   Procedure: TOTAL HIP ARTHROPLASTY ANTERIOR APPROACH;  Surgeon: Melodi Lerner, MD;  Location: [facility];  Service: Orthopedics;  Laterality: Right;    TOTAL HIP ARTHROPLASTY Left 07/27/2024   Procedure: ARTHROPLASTY, HIP, TOTAL, ANTERIOR APPROACH;  Surgeon: Melodi Lerner, MD;  Location: [facility];  Service: Orthopedics;  Laterality: Left;   Patient Active Problem List   Diagnosis Date Noted   Primary osteoarthritis of left hip 07/27/2024   OA (osteoarthritis) of hip 03/27/2021   Primary osteoarthritis of right hip 03/27/2021   Allergic contact  dermatitis due to metals 11/19/2020    PCP: Beverley Norleen NOVAK, MD   REFERRING PROVIDER: Melodi Lerner, MD   REFERRING DIAG:  Z51.89 (ICD-10-CM) - Encounter for other specified aftercare  [phone] (ICD-10-CM) - S/P total hip arthroplasty    THERAPY DIAG:  Pain in left hip  Sciatica, left side  Other abnormalities of gait and mobility  Muscle weakness (generalized)  RATIONALE FOR EVALUATION AND TREATMENT: Rehabilitation  ONSET DATE: 07/27/24  NEXT MD VISIT:    SUBJECTIVE:  SUBJECTIVE STATEMENT: Patient reports she cannot do clam/hip ER exercise because it increases her pain too much.   She is still having the sciatic pain LLE from her back down posterior hip and thigh  EVAL:  Patient is a 78 y/o referred to PT following a anterior L THA by Dr Hiram on 07/27/24.  Orders are 1x/week x 4 weeks per referral from Dr Hiram.   Patient had several weeks of SNF rehab and also [facility] PT, but is finished with these services.   Called [facility] and requested that they D/C patient from [facility] services since she has now started outpatient PT.   Patient used a cane at baseline prior to surgery, and has only just started using a cane again in the last few days.   She has been walker dependent to date.   Her biggest c/o is posterior L hip pain/sciatica which she states has been long standing for years, but is much worse since the surgery.  She really has no anterior hip pain or thigh pain.   All of her pain is radicular in nature from the low back down the posterolateral thigh  to knee.   She lives alone and normally functions independently with all ADL.  She is still working as a Veterinary surgeon.  PAIN: Are you having pain? Yes: NPRS scale: 4/10;  6/10 worst Pain location: L posterior hip/sciatic pain Pain description:  burning, radiating down the leg Aggravating factors: prolonged standing, bending, lifting Relieving factors: lying on R side w/ pillows between knees  PERTINENT HISTORY:  R THA 2022, remote h/o back surgery (no fusion); scoliosis, chronic back pain, pre-diabetes, DDD  PRECAUTIONS: Anterior hip  RED FLAGS: None  WEIGHT BEARING RESTRICTIONS: No  FALLS:  Has patient fallen in last 6 months? No  LIVING ENVIRONMENT: Lives with: lives alone Lives in: House/apartment Stairs: Yes: External: 3 steps; on left going up Has following equipment at home: Single point cane  OCCUPATION: still works as a Veterinary surgeon  PLOF: Independent with gait  PATIENT GOALS: get back to where I was presurgery, do housework, go shopping, get my groceries, etc.   OBJECTIVE: (objective measures completed at initial evaluation unless otherwise dated)  DIAGNOSTIC FINDINGS:  N/a  PATIENT SURVEYS:  LEFS  Extreme difficulty/unable (0), Quite a bit of difficulty (1), Moderate difficulty (2), Little difficulty (3), No difficulty (4) Survey date:  09/08/24  Any of your usual work, housework or school activities 0  2. Usual hobbies, recreational or sporting activities   3. Getting into/out of the bath 03  4. Walking between rooms 3  5. Putting on socks/shoes 3  6. Squatting  1  7. Lifting an object, like a bag of groceries from the floor 2  8. Performing light activities around your home 2  9. Performing heavy activities around your home 1  10. Getting into/out of a car 3  11. Walking 2 blocks 0  12. Walking 1 mile 0  13. Going up/down 10 stairs (1 flight) 0  14. Standing for 1 hour 0  15.  sitting for 1 hour 1  16. Running on even ground 0  17. Running on uneven ground 0  18. Making sharp turns while running fast 0  19. Hopping  0  20. Rolling over in bed 1  Score total:  20     COGNITION: Overall cognitive status: Within functional limits for tasks assessed    SENSATION: WFL  EDEMA:   NT  POSTURE:  decreased lumbar lordosis  PALPATION: Quite TTP over the sciatic nerve in the buttock  MUSCLE LENGTH: Debby test: NT due to anterior THA Hamstrings: mild tightness LLE ITB: NT due to anterior THA Piriformis: NT Hip flexors: unable to lie flat on back w/ LLE fully extended due to pain in anterior and posterior L hip    Active ROM Right eval Left eval  Hip flexion    Hip extension    Hip abduction    Hip adduction    Hip internal rotation    Hip external rotation    Knee flexion    Knee extension    Ankle dorsiflexion    Ankle plantarflexion    Ankle inversion    Ankle eversion    (Blank rows = not tested)  LOWER EXTREMITY MMT:  MMT Right eval Left eval  Hip flexion 5 4-  Hip extension    Hip abduction 3- NT  Hip adduction    Hip internal rotation 4- 5  Hip external rotation 4 5  Knee flexion 4 5  Knee extension 4 5  Ankle dorsiflexion 5 5  Ankle plantarflexion    Ankle inversion    Ankle eversion     (Blank rows = not tested)   FUNCTIONAL TESTS:  DGI/FGA TBD future visit 5x STS = 15.81 sec TUG = 17.88 sec with cane  GAIT: Distance walked: into clinic x 150' Assistive device utilized: Single point cane Level of assistance: CGA Gait pattern:  , decreased step length- Right, decreased stance time- Left, and antalgic Comments: very tentative, guarded short steps, unsure of herself with the cane, and needs CGA from PT for safety due to unsteadiness.   No overt LOB noted, but patient is not safe with cane unassisted in community settings yet.   Very high fall risk with cane in community for now    TODAY'S TREATMENT:  09/14/24 THERAPEUTIC EXERCISE: To improve strength.  Demonstration, verbal and tactile cues throughout for technique. NuStep L5 x 6'  THERAPEUTIC ACTIVITIES: To improve functional performance.  Demonstration, verbal and tactile cues throughout for technique. Seated hip ER/abd RTB x 3/10 BLE Seated hip flexor stretch x 1' x 3  LLE Lateral step ups x 2/10 LLE Sidestepping x 3 laps at mat table Counter back extension x 10 Supine lacrosse ball self massage  MANUAL THERAPY: To promote reduced pain utilizing connective tissue massage, therapeutic massage, and myofascial release. Theragun to pirformis and gluteals in R SL;  Foam roller MFR to same; lacrosse ball massage to same  MODALITIES: HP/IFC x 15' to L hip in R SL at machine default settings and intensity around 32mA  09/08/24 SELF CARE: Provided education on PT POC progression and to improve safety with use of SPC for assistive device, initial HEP   PATIENT EDUCATION:  Education details: HEP review and HEP update  Person educated: Patient Education method: Explanation, Demonstration, Verbal cues, Tactile cues, and Handouts Education comprehension: verbalized understanding, verbal cues required, tactile cues required, and needs further education  HOME EXERCISE PROGRAM: Access Code: [email or web address] URL: [email or web address] Date: 09/08/2024 Prepared by: Garnette Montclair  Exercises - Clam with Resistance  - 1 x daily - 7 x weekly - 2 sets - 10 reps - Bridge with Resistance  - 1 x daily - 7 x weekly - 2 sets - 10 reps - Forward Step Up with Counter Support  - 1 x daily - 7 x weekly - 1 sets - 10 reps - Lateral Step Up with Counter Support  - 1 x daily -  7 x weekly - 1 sets - 10 reps   ASSESSMENT:  CLINICAL IMPRESSION: Patient is still having considerable sciatic pain.  Clams are too painful, so this is modified to a seated position hip ER.  All of her pain is posteriorly.   She has no anterior R hip/groin pain with any exercises we are doing in therapy today.  She gets some relief with manual therapy and electrical stimulation after treatment.   We will continue to gently progress L hip strength and gait away from her cane as able.  She states she spoke with Dr hiram and that he said she could come in for twice weekly visits.   We will increase  her frequency to BIW.    Tracey Morgan is a 78 y.o. female who was referred to physical therapy for evaluation and treatment following an anterior L THA by Dr hiram about 6 weeks ago.   Orders are for 1x/weekly for 4 weeks.   Patient states she would like more visits than this if possible, and is advised that we would need her surgeon to send us  new orders for increased frequency.  Otherwise we will follow surgeon's orders for 1x/week. Patient is really not having any anterior L hip pain unless she tries to lie on her back with her legs fully extended flat on the table.   This is the main position she gets any anterior pain.  Otherwise she is fine with anterior L hip.   Her main c/o is L sciatica which she states she had a long time prior to surgery.   However, her sciatic pain has been much worse since the surgery.  She has only just been able to start using the cane as of this week due to the sciatic pain, and she is not steady with it.   She is probably ok with the cane at home, but not in community.   Gait is very slow and guarded with short steps with unsteadiness, no trunk rotation or movement.  She is very TTP over the L sciatic nerve and hip.   Hip musculature still quite weak and she has not regaained good strength or control with the L hip yet.   Patient has deficits in L hip/ LE flexibility, L hip strength, unsteady gait, pain, and TTP with abnormal muscle tension in the L piriformis/posterior hipo musculature which are interfering with ADLs and are impacting quality of life.  On LEFS patient scored 20/80 demonstrating severe functional limitation. Her goal is to return to independent gait and housework, etc.  She has good rehab potential to meet this goal, but she may need more time in therapy than the typical THA patient.    Tracey Morgan will benefit from skilled PT to address above deficits to improve mobility and activity tolerance with decreased pain interference.  OBJECTIVE IMPAIRMENTS: Abnormal  gait, difficulty walking, decreased strength, and pain.   ACTIVITY LIMITATIONS: carrying, lifting, bending, standing, squatting, sleeping, and locomotion level  PARTICIPATION LIMITATIONS: meal prep, cleaning, laundry, shopping, community activity, occupation, and yard work  PERSONAL FACTORS: Age, Fitness, and 1-2 comorbidities: R THA 2022, remote h/o back surgery (no fusion); scoliosis, chronic back pain, pre-diabetes, DDD are also affecting patient's functional outcome.   REHAB POTENTIAL: Good  CLINICAL DECISION MAKING: Evolving/moderate complexity  EVALUATION COMPLEXITY: Moderate   GOALS: Goals reviewed with patient? Yes  SHORT TERM GOALS: Target date: 10/06/2024  Patient will be independent with initial HEP. Baseline: 100% PT assist required for correct completion Goal  status: IN PROGRESS  2.  Patient will report at least 25% improvement in L hip pain to improve QOL. Baseline: 6/10 worst, posterior L hip Goal status: INITIAL  3.  Patient will demonstrate independent ambulation with her SPC indoors, outdoors, and up/down 1 flight of steps reciprocally w/ rail PRN for safe community access with cane Baseline: unsteady gait with cane, slow, requires CGA for safety and balance Goal status: IN PROGRESS  LONG TERM GOALS: Target date: 11/03/2024   Patient will be independent with advanced/ongoing HEP to improve outcomes and carryover.  Baseline: no advanced HEP yet Goal status: INITIAL  2.  Patient will report at least 50-75% improvement in posterior L hip pain to improve QOL. Baseline: 6/10 worst Goal status: INITIAL  3.  Patient will demonstrate improved LLE strength to >/= 5/5 for improved stability and ease of mobility. Baseline: Refer to above LE MMT table Goal status: INITIAL  4.  Patient will be able to ambulate 600' with no device and normal gait pattern without increased pain to access community.  Baseline: cane with CGA on level surfaces;  steps TBD Goal status:  INITIAL  5.  Patient will report >/= 50/80 on LEFS (MCID = 9 pts) to demonstrate improved functional ability. Baseline: 20/80 Goal status: INITIAL  6.  Patient will demonstrate at least 19/24 on DGI to decrease risk of falls. Baseline: TBD Goal status: INITIAL    PLAN:  PT FREQUENCY: 1-2x/week  PT DURATION: 4 weeks  PLANNED INTERVENTIONS: 97164- PT Re-evaluation, 97750- Physical Performance Testing, 97110-Therapeutic exercises, 97530- Therapeutic activity, [id number]- Neuromuscular re-education, 97535- Self Care, [id number]- Manual therapy, [id number]- Gait training, [id number]- Electrical stimulation (unattended), 97016- Vasopneumatic device, [id number]- Ultrasound, 02966- Ionotophoresis 4mg /ml Dexamethasone , Patient/Family education, Balance training, Stair training, Taping, Spinal mobilization, Cryotherapy, and Moist heat  PLAN FOR NEXT SESSION: see how MFR and estim did and repeat as needed for pain relief;  gentle L hip strengthening and stretching  Leanne Sisler, PT 09/14/2024, 3:15 PM

## 2024-09-22 ENCOUNTER — Ambulatory Visit: Admitting: Rehabilitation

## 2024-09-22 DIAGNOSIS — M6281 Muscle weakness (generalized): Secondary | ICD-10-CM

## 2024-09-22 DIAGNOSIS — M5432 Sciatica, left side: Secondary | ICD-10-CM

## 2024-09-22 DIAGNOSIS — R2689 Other abnormalities of gait and mobility: Secondary | ICD-10-CM

## 2024-09-22 DIAGNOSIS — M25552 Pain in left hip: Secondary | ICD-10-CM | POA: Diagnosis not present

## 2024-09-22 NOTE — Therapy (Signed)
 OUTPATIENT PHYSICAL THERAPY LOWER EXTREMITY EVALUATION   Patient Name: Tracey Morgan MRN: [id number] DOB:04-10-1946, 78 y.o., female Today's Date: 09/25/2024   END OF SESSION:    Past Medical History:  Diagnosis Date   Arthritis    back, hips   DDD (degenerative disc disease), lumbar    Diverticulitis    under control   Eczema    Family history of adverse reaction to anesthesia    sister and daughter very slow to wake up   GERD (gastroesophageal reflux disease)    Heart murmur    History of kidney stones    Neuromuscular disorder (HCC)    Rt leg    Pneumonia    as child   PONV (postoperative nausea and vomiting)    Pre-diabetes    Sciatica    Scoliosis    Past Surgical History:  Procedure Laterality Date   ADENOIDECTOMY     APPENDECTOMY  1979   BACK SURGERY  1981   CATARACT EXTRACTION Left    CESAREAN SECTION  1981   CHOLECYSTECTOMY  1972   COLON SURGERY  2021   sigmoidectomy   GALLBLADDER SURGERY  1979   HIP SURGERY Right 03/2021   TONSILLECTOMY     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   Procedure: TOTAL HIP ARTHROPLASTY ANTERIOR APPROACH;  Surgeon: Melodi Lerner, MD;  Location: [facility];  Service: Orthopedics;  Laterality: Right;    TOTAL HIP ARTHROPLASTY Left 07/27/2024   Procedure: ARTHROPLASTY, HIP, TOTAL, ANTERIOR APPROACH;  Surgeon: Melodi Lerner, MD;  Location: [facility];  Service: Orthopedics;  Laterality: Left;   Patient Active Problem List   Diagnosis Date Noted   Primary osteoarthritis of left hip 07/27/2024   OA (osteoarthritis) of hip 03/27/2021   Primary osteoarthritis of right hip 03/27/2021   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    PCP: Beverley Norleen NOVAK, MD   REFERRING PROVIDER: Melodi Lerner, MD   REFERRING DIAG:  Z51.89 (ICD-10-CM) - Encounter for other specified aftercare  [phone] (ICD-10-CM) - S/P total hip arthroplasty    THERAPY DIAG:  Pain in left hip  Sciatica, left side  Other abnormalities of  gait and mobility  Muscle weakness (generalized)  RATIONALE FOR EVALUATION AND TREATMENT: Rehabilitation  ONSET DATE: 07/27/24  NEXT MD VISIT:    SUBJECTIVE:                                                                                                                                                                                                         SUBJECTIVE STATEMENT: Patient reports  after last treatment she has felt no pain in her posterior L hip.   However, her R posterior hip is in severe pain since the weekend for no apparent reason.    She thinks it is probably coming from her back and she has an appointment with spine and scoliosis specialists on 11/4 to see if they can help her.     EVAL:  Patient is a 78 y/o referred to PT following a anterior L THA by Dr Hiram on 07/27/24.  Orders are 1x/week x 4 weeks per referral from Dr Hiram.   Patient had several weeks of SNF rehab and also [facility] PT, but is finished with these services.   Called [facility] and requested that they D/C patient from [facility] services since she has now started outpatient PT.   Patient used a cane at baseline prior to surgery, and has only just started using a cane again in the last few days.   She has been walker dependent to date.   Her biggest c/o is posterior L hip pain/sciatica which she states has been long standing for years, but is much worse since the surgery.  She really has no anterior hip pain or thigh pain.   All of her pain is radicular in nature from the low back down the posterolateral thigh  to knee.   She lives alone and normally functions independently with all ADL.  She is still working as a Veterinary surgeon.  PAIN: Are you having pain? Yes: NPRS scale: 4/10;  6/10 worst Pain location: L posterior hip/sciatic pain Pain description: burning, radiating down the leg Aggravating factors: prolonged standing, bending, lifting Relieving factors: lying on R side w/ pillows between knees  PERTINENT HISTORY:  R  THA 2022, remote h/o back surgery (no fusion); scoliosis, chronic back pain, pre-diabetes, DDD  PRECAUTIONS: Anterior hip  RED FLAGS: None  WEIGHT BEARING RESTRICTIONS: No  FALLS:  Has patient fallen in last 6 months? No  LIVING ENVIRONMENT: Lives with: lives alone Lives in: House/apartment Stairs: Yes: External: 3 steps; on left going up Has following equipment at home: Single point cane  OCCUPATION: still works as a Veterinary surgeon  PLOF: Independent with gait  PATIENT GOALS: get back to where I was presurgery, do housework, go shopping, get my groceries, etc.   OBJECTIVE: (objective measures completed at initial evaluation unless otherwise dated)  DIAGNOSTIC FINDINGS:  N/a  PATIENT SURVEYS:  LEFS  Extreme difficulty/unable (0), Quite a bit of difficulty (1), Moderate difficulty (2), Little difficulty (3), No difficulty (4) Survey date:  09/08/24  Any of your usual work, housework or school activities 0  2. Usual hobbies, recreational or sporting activities   3. Getting into/out of the bath 03  4. Walking between rooms 3  5. Putting on socks/shoes 3  6. Squatting  1  7. Lifting an object, like a bag of groceries from the floor 2  8. Performing light activities around your home 2  9. Performing heavy activities around your home 1  10. Getting into/out of a car 3  11. Walking 2 blocks 0  12. Walking 1 mile 0  13. Going up/down 10 stairs (1 flight) 0  14. Standing for 1 hour 0  15.  sitting for 1 hour 1  16. Running on even ground 0  17. Running on uneven ground 0  18. Making sharp turns while running fast 0  19. Hopping  0  20. Rolling over in bed 1  Score total:  20  COGNITION: Overall cognitive status: Within functional limits for tasks assessed    SENSATION: WFL  EDEMA:  NT  POSTURE:  decreased lumbar lordosis  PALPATION: Quite TTP over the sciatic nerve in the buttock  MUSCLE LENGTH: Thomas test: NT due to anterior THA Hamstrings: mild tightness  LLE ITB: NT due to anterior THA Piriformis: NT Hip flexors: unable to lie flat on back w/ LLE fully extended due to pain in anterior and posterior L hip    Active ROM Right eval Left eval  Hip flexion    Hip extension    Hip abduction    Hip adduction    Hip internal rotation    Hip external rotation    Knee flexion    Knee extension    Ankle dorsiflexion    Ankle plantarflexion    Ankle inversion    Ankle eversion    (Blank rows = not tested)  LOWER EXTREMITY MMT:  MMT Right eval Left eval  Hip flexion 5 4-  Hip extension    Hip abduction 3- NT  Hip adduction    Hip internal rotation 4- 5  Hip external rotation 4 5  Knee flexion 4 5  Knee extension 4 5  Ankle dorsiflexion 5 5  Ankle plantarflexion    Ankle inversion    Ankle eversion     (Blank rows = not tested)   FUNCTIONAL TESTS:  DGI/FGA TBD future visit 5x STS = 15.81 sec TUG = 17.88 sec with cane  GAIT: Distance walked: into clinic x 150' Assistive device utilized: Single point cane Level of assistance: CGA Gait pattern:  , decreased step length- Right, decreased stance time- Left, and antalgic Comments: very tentative, guarded short steps, unsure of herself with the cane, and needs CGA from PT for safety due to unsteadiness.   No overt LOB noted, but patient is not safe with cane unassisted in community settings yet.   Very high fall risk with cane in community for now    TODAY'S TREATMENT:  09/22/24 THERAPEUTIC EXERCISE: To improve strength.  Demonstration, verbal and tactile cues throughout for technique. NuStep L5 x 6'  Manual SLR hamstring stretch by PT x 1' x 3 RLE in hooklying Supine FABER R piriformis stretch x 1' x 3 RLE  MANUAL THERAPY: To promote reduced pain utilizing connective tissue massage, therapeutic massage, and myofascial release. Theragun to RIGHT pirformis in partial L sidelying but not all the way over on her L hip---patient has no pain L hip with positioning for treatment of  the R;   she is exquisitely TTP over the R ischial tuberosity and R piriformis    MODALITIES: HP/IFC x 15' to R hip in partial L SL at machine default settings and intensity around 27mA  09/14/24 THERAPEUTIC EXERCISE: To improve strength.  Demonstration, verbal and tactile cues throughout for technique. NuStep L5 x 6'  THERAPEUTIC ACTIVITIES: To improve functional performance.  Demonstration, verbal and tactile cues throughout for technique. Seated hip ER/abd RTB x 3/10 BLE Seated hip flexor stretch x 1' x 3 LLE Lateral step ups x 2/10 LLE Sidestepping x 3 laps at mat table Counter back extension x 10 Supine lacrosse ball self massage  MANUAL THERAPY: To promote reduced pain utilizing connective tissue massage, therapeutic massage, and myofascial release. Theragun to pirformis and gluteals in R SL;  Foam roller MFR to same; lacrosse ball massage to same  MODALITIES: HP/IFC x 15' to L hip in R SL at machine default settings and intensity around  32mA  09/08/24 SELF CARE: Provided education on PT POC progression and to improve safety with use of SPC for assistive device, initial HEP   PATIENT EDUCATION:  Education details: HEP review and HEP update  Person educated: Patient Education method: Explanation, Demonstration, Verbal cues, Tactile cues, and Handouts Education comprehension: verbalized understanding, verbal cues required, tactile cues required, and needs further education  HOME EXERCISE PROGRAM: Access Code: [email or web address] URL: [email or web address] Date: 09/08/2024 Prepared by: Garnette Montclair  Exercises - Clam with Resistance  - 1 x daily - 7 x weekly - 2 sets - 10 reps - Bridge with Resistance  - 1 x daily - 7 x weekly - 2 sets - 10 reps - Forward Step Up with Counter Support  - 1 x daily - 7 x weekly - 1 sets - 10 reps - Lateral Step Up with Counter Support  - 1 x daily - 7 x weekly - 1 sets - 10 reps   ASSESSMENT:  CLINICAL IMPRESSION: L sciatica has  resolved per patient report.   She had excellent relief after last PT visit.   Unfortunately she is now having severe R hip pain.  She is very TTP over the R posterior hip and ischial tuberosity.   Resisted R knee flexion does not provoke.  She does have ROM restrictions in the R hip so she is given stretching and modalities for this area today.   Will reassess next visit.   Tracey Morgan is a 78 y.o. female who was referred to physical therapy for evaluation and treatment following an anterior L THA by Dr Hiram about 6 weeks ago.   Orders are for 1x/weekly for 4 weeks.   Patient states she would like more visits than this if possible, and is advised that we would need her surgeon to send us  new orders for increased frequency.  Otherwise we will follow surgeon's orders for 1x/week. Patient is really not having any anterior L hip pain unless she tries to lie on her back with her legs fully extended flat on the table.   This is the main position she gets any anterior pain.  Otherwise she is fine with anterior L hip.   Her main c/o is L sciatica which she states she had a long time prior to surgery.   However, her sciatic pain has been much worse since the surgery.  She has only just been able to start using the cane as of this week due to the sciatic pain, and she is not steady with it.   She is probably ok with the cane at home, but not in community.   Gait is very slow and guarded with short steps with unsteadiness, no trunk rotation or movement.  She is very TTP over the L sciatic nerve and hip.   Hip musculature still quite weak and she has not regaained good strength or control with the L hip yet.   Patient has deficits in L hip/ LE flexibility, L hip strength, unsteady gait, pain, and TTP with abnormal muscle tension in the L piriformis/posterior hipo musculature which are interfering with ADLs and are impacting quality of life.  On LEFS patient scored 20/80 demonstrating severe functional limitation. Her  goal is to return to independent gait and housework, etc.  She has good rehab potential to meet this goal, but she may need more time in therapy than the typical THA patient.    Tracey Morgan will benefit from skilled PT to address above deficits to improve  mobility and activity tolerance with decreased pain interference.  OBJECTIVE IMPAIRMENTS: Abnormal gait, difficulty walking, decreased strength, and pain.   ACTIVITY LIMITATIONS: carrying, lifting, bending, standing, squatting, sleeping, and locomotion level  PARTICIPATION LIMITATIONS: meal prep, cleaning, laundry, shopping, community activity, occupation, and yard work  PERSONAL FACTORS: Age, Fitness, and 1-2 comorbidities: R THA 2022, remote h/o back surgery (no fusion); scoliosis, chronic back pain, pre-diabetes, DDD are also affecting patient's functional outcome.   REHAB POTENTIAL: Good  CLINICAL DECISION MAKING: Evolving/moderate complexity  EVALUATION COMPLEXITY: Moderate   GOALS: Goals reviewed with patient? Yes  SHORT TERM GOALS: Target date: 10/06/2024  Patient will be independent with initial HEP. Baseline: 100% PT assist required for correct completion Goal status: IN PROGRESS  2.  Patient will report at least 25% improvement in L hip pain to improve QOL. Baseline: 6/10 worst, posterior L hip Goal status: INITIAL  3.  Patient will demonstrate independent ambulation with her SPC indoors, outdoors, and up/down 1 flight of steps reciprocally w/ rail PRN for safe community access with cane Baseline: unsteady gait with cane, slow, requires CGA for safety and balance Goal status: IN PROGRESS  LONG TERM GOALS: Target date: 11/03/2024   Patient will be independent with advanced/ongoing HEP to improve outcomes and carryover.  Baseline: no advanced HEP yet Goal status: INITIAL  2.  Patient will report at least 50-75% improvement in posterior L hip pain to improve QOL. Baseline: 6/10 worst Goal status: INITIAL  3.  Patient  will demonstrate improved LLE strength to >/= 5/5 for improved stability and ease of mobility. Baseline: Refer to above LE MMT table Goal status: INITIAL  4.  Patient will be able to ambulate 600' with no device and normal gait pattern without increased pain to access community.  Baseline: cane with CGA on level surfaces;  steps TBD Goal status: INITIAL  5.  Patient will report >/= 50/80 on LEFS (MCID = 9 pts) to demonstrate improved functional ability. Baseline: 20/80 Goal status: INITIAL  6.  Patient will demonstrate at least 19/24 on DGI to decrease risk of falls. Baseline: TBD Goal status: INITIAL    PLAN:  PT FREQUENCY: 1-2x/week  PT DURATION: 4 weeks  PLANNED INTERVENTIONS: 02835- PT Re-evaluation, 97750- Physical Performance Testing, 97110-Therapeutic exercises, 97530- Therapeutic activity, [id number]- Neuromuscular re-education, 97535- Self Care, 02859- Manual therapy, [phone]- Gait training, [phone]- Electrical stimulation (unattended), 97016- Vasopneumatic device, 97035- Ultrasound, 02966- Ionotophoresis 4mg /ml Dexamethasone , Patient/Family education, Balance training, Stair training, Taping, Spinal mobilization, Cryotherapy, and Moist heat  PLAN FOR NEXT SESSION: assess L and R hip pain;  add hip extension activities if able;  Return to doing step ups if she can tolerate;  wall squats vs. Sink squats;  add balance if able   Tracey Morgan, PT 09/25/2024, 2:38 PM

## 2024-09-25 ENCOUNTER — Encounter: Payer: Self-pay | Admitting: Rehabilitation

## 2024-09-27 ENCOUNTER — Telehealth: Payer: Self-pay | Admitting: Rehabilitation

## 2024-09-27 ENCOUNTER — Ambulatory Visit

## 2024-09-27 NOTE — Telephone Encounter (Signed)
 Patient called yesterday to let us  know she was in a lot pain and having to use her walker.   Called back this AM at 1040 but was unable to reach patient.  L/M to call us  back at her convenience.

## 2024-09-29 ENCOUNTER — Ambulatory Visit

## 2024-10-03 ENCOUNTER — Emergency Department (HOSPITAL_COMMUNITY)

## 2024-10-03 ENCOUNTER — Inpatient Hospital Stay (HOSPITAL_COMMUNITY)
Admission: EM | Admit: 2024-10-03 | Discharge: 2024-10-11 | DRG: 544 | Disposition: A | Discharge status: Skilled Nursing Facility | Attending: Internal Medicine | Admitting: Internal Medicine

## 2024-10-03 DIAGNOSIS — K219 Gastro-esophageal reflux disease without esophagitis: Secondary | ICD-10-CM | POA: Diagnosis present

## 2024-10-03 DIAGNOSIS — E66811 Obesity, class 1: Secondary | ICD-10-CM | POA: Diagnosis present

## 2024-10-03 DIAGNOSIS — G8929 Other chronic pain: Secondary | ICD-10-CM | POA: Diagnosis present

## 2024-10-03 DIAGNOSIS — Z885 Allergy status to narcotic agent status: Secondary | ICD-10-CM

## 2024-10-03 DIAGNOSIS — R7303 Prediabetes: Secondary | ICD-10-CM | POA: Diagnosis present

## 2024-10-03 DIAGNOSIS — Z6832 Body mass index (BMI) 32.0-32.9, adult: Secondary | ICD-10-CM

## 2024-10-03 DIAGNOSIS — S3210XA Unspecified fracture of sacrum, initial encounter for closed fracture: Principal | ICD-10-CM | POA: Diagnosis present

## 2024-10-03 DIAGNOSIS — M1611 Unilateral primary osteoarthritis, right hip: Secondary | ICD-10-CM | POA: Diagnosis present

## 2024-10-03 DIAGNOSIS — K5900 Constipation, unspecified: Secondary | ICD-10-CM | POA: Diagnosis present

## 2024-10-03 DIAGNOSIS — I10 Essential (primary) hypertension: Secondary | ICD-10-CM | POA: Diagnosis present

## 2024-10-03 DIAGNOSIS — M419 Scoliosis, unspecified: Secondary | ICD-10-CM | POA: Diagnosis present

## 2024-10-03 DIAGNOSIS — S3219XA Other fracture of sacrum, initial encounter for closed fracture: Secondary | ICD-10-CM

## 2024-10-03 DIAGNOSIS — M169 Osteoarthritis of hip, unspecified: Secondary | ICD-10-CM | POA: Diagnosis not present

## 2024-10-03 DIAGNOSIS — E1169 Type 2 diabetes mellitus with other specified complication: Secondary | ICD-10-CM | POA: Diagnosis not present

## 2024-10-03 DIAGNOSIS — M8448XA Pathological fracture, other site, initial encounter for fracture: Secondary | ICD-10-CM | POA: Diagnosis present

## 2024-10-03 DIAGNOSIS — Z96643 Presence of artificial hip joint, bilateral: Secondary | ICD-10-CM | POA: Diagnosis present

## 2024-10-03 DIAGNOSIS — E119 Type 2 diabetes mellitus without complications: Secondary | ICD-10-CM

## 2024-10-03 DIAGNOSIS — I159 Secondary hypertension, unspecified: Secondary | ICD-10-CM

## 2024-10-03 LAB — BASIC METABOLIC PANEL WITH GFR
Anion gap: 13 (ref 5–15)
BUN: 12 mg/dL (ref 8–23)
CO2: 23 mmol/L (ref 22–32)
Calcium: 9.5 mg/dL (ref 8.9–10.3)
Chloride: 101 mmol/L (ref 98–111)
Creatinine, Ser: 0.7 mg/dL (ref 0.44–1.00)
GFR, Estimated: >60 mL/min (ref >60)
Glucose, Bld: 138 mg/dL — ABNORMAL HIGH (ref 70–99)
Potassium: 3.7 mmol/L (ref 3.5–5.1)
Sodium: 137 mmol/L (ref 135–145)

## 2024-10-03 LAB — CBC WITH DIFFERENTIAL/PLATELET
Abs Immature Granulocytes: 0.02 K/uL (ref 0.00–0.07)
Basophils Absolute: 0 K/uL (ref 0.0–0.1)
Basophils Relative: 0 %
Eosinophils Absolute: 0.1 K/uL (ref 0.0–0.5)
Eosinophils Relative: 1 %
HCT: 41.3 % (ref 36.0–46.0)
Hemoglobin: 12.9 g/dL (ref 12.0–15.0)
Immature Granulocytes: 0 %
Lymphocytes Relative: 16 %
Lymphs Abs: 1.2 K/uL (ref 0.7–4.0)
MCH: 29 pg (ref 26.0–34.0)
MCHC: 31.2 g/dL (ref 30.0–36.0)
MCV: 92.8 fL (ref 80.0–100.0)
Monocytes Absolute: 0.5 K/uL (ref 0.1–1.0)
Monocytes Relative: 7 %
Neutro Abs: 5.7 K/uL (ref 1.7–7.7)
Neutrophils Relative %: 76 %
Platelets: 434 K/uL — ABNORMAL HIGH (ref 150–400)
RBC: 4.45 MIL/uL (ref 3.87–5.11)
RDW: 12.8 % (ref 11.5–15.5)
WBC: 7.6 K/uL (ref 4.0–10.5)
nRBC: 0 % (ref 0.0–0.2)

## 2024-10-03 MED ORDER — HYDROMORPHONE HCL 1 MG/ML IJ SOLN
2.0000 mg | Freq: Once | INTRAMUSCULAR | Status: AC
  Administered 2024-10-03: 2 mg via INTRAVENOUS
  Filled 2024-10-03: qty 2

## 2024-10-03 MED ORDER — ONDANSETRON HCL 4 MG/2ML IJ SOLN
4.0000 mg | Freq: Once | INTRAMUSCULAR | Status: AC
  Administered 2024-10-03: 4 mg via INTRAVENOUS
  Filled 2024-10-03: qty 2

## 2024-10-03 MED ORDER — SODIUM CHLORIDE 0.9 % IV SOLN
12.5000 mg | Freq: Four times a day (QID) | INTRAVENOUS | Status: DC | PRN
  Administered 2024-10-04 (×2): 12.5 mg via INTRAVENOUS
  Filled 2024-10-03 (×2): qty 12.5

## 2024-10-03 NOTE — ED Provider Notes (Signed)
 [place] EMERGENCY DEPARTMENT AT [facility] Provider Note   CSN: [id number] Arrival date & time: 10/03/24  2022     Patient presents with: Tailbone Pain   Tracey Morgan is a 78 y.o. female.   78 year old female with past medical history of chronic pain and sciatica presenting to the emergency department today with bilateral hip pain.  The patient states that she has been following up with her orthopedist as an outpatient.  Reports that she saw them last week.  She states that she was started on prednisone  as well as oral Dilaudid .  The patient states that in August she did have a left hip replacement.  She states that the pain now is in her back and radiates to bilateral hips.  She had x-rays performed of her hips which were negative.  She came to the emergency department today due to worsening symptoms.  She denies any fevers.  Denies any recent falls or injuries.  She has been taking 4 mg of Dilaudid  every 6 hours in addition to gabapentin  and muscle relaxers with continued pain.  She denies any bowel or bladder dysfunction has not had any saddle anesthesia.        Prior to Admission medications   Medication Sig Start Date End Date Taking? Authorizing Provider  acetaminophen  (TYLENOL ) 500 MG tablet Take 500 mg by mouth every 6 (six) hours as needed for headache or moderate pain (pain score 4-6).    [provider]  azelastine  (ASTELIN ) 0.1 % nasal spray USE 1 DOSE IN EACH NOSTRIL TWICE DAILY AS DIRECTED 09/17/22   Cheryl Reusing, FNP  cetirizine  (ZYRTEC ) 10 MG tablet Take 1 tablet by mouth once daily 11/03/22   Cheryl Reusing, FNP  clobetasol ointment (TEMOVATE) 0.05 % Apply 1 Application topically 2 (two) times daily as needed (irritation). 05/15/21   [provider]  dapagliflozin  propanediol (FARXIGA ) 5 MG TABS tablet Take 10 mg by mouth daily.    [provider]  diphenhydrAMINE  (BENADRYL ) 25 MG tablet Take 25 mg by mouth every 6 (six)  hours as needed for allergies or itching.    [provider]  docusate sodium  (COLACE) 100 MG capsule Take 1 capsule (100 mg total) by mouth 2 (two) times daily. 07/28/24   Edmisten, Kristie L, PA  EPINEPHrine  0.3 mg/0.3 mL IJ SOAJ injection Inject 0.3 mg into the muscle as needed for anaphylaxis. 12/05/21   Cheryl Reusing, FNP  famotidine  (PEPCID ) 40 MG tablet Take 40 mg by mouth at bedtime as needed for heartburn or indigestion.    [provider]  fluticasone  (FLONASE ) 50 MCG/ACT nasal spray Place 2 sprays into both nostrils daily.    [provider]  HYDROmorphone  (DILAUDID ) 2 MG tablet Take 1-2 tablets (2-4 mg total) by mouth every 6 (six) hours as needed for moderate pain (pain score 4-6) or severe pain (pain score 7-10). 07/28/24   Edmisten, Kristie L, PA  methocarbamol  (ROBAXIN ) 500 MG tablet Take 1 tablet (500 mg total) by mouth every 6 (six) hours as needed for muscle spasms. 07/28/24   Edmisten, Roxie CROME, PA  ondansetron  (ZOFRAN ) 4 MG tablet Take 1 tablet (4 mg total) by mouth every 6 (six) hours as needed for nausea. 07/28/24   Edmisten, Kristie L, PA  pantoprazole  (PROTONIX ) 40 MG tablet Take 40 mg by mouth daily.    [provider]  polyethylene glycol (MIRALAX  / GLYCOLAX ) 17 g packet Take 17 g by mouth daily as needed for mild constipation. 07/28/24  Edmisten, Kristie L, PA  POTASSIUM PO Take 99 mg by mouth daily.    [provider]  predniSONE  (STERAPRED UNI-PAK 21 TAB) 5 MG (21) TBPK tablet 4 tablets on 8/23, 3 tablets on 8/24, 2 tablets on 8/25, 1 tablet on 8/26 07/30/24   Edmisten, Kristie L, PA  torsemide  (DEMADEX ) 20 MG tablet Take 20 mg by mouth daily.    [provider]  triamcinolone lotion (KENALOG) 0.1 % Apply 1 Application topically 2 (two) times daily.    [provider]    Allergies: Hydralazine, Metformin, Metronidazole , Percocet [oxycodone-acetaminophen ], Buprenorphine hcl, Codeine, Hydrocodone , and Morphine  and codeine    Review of Systems  Musculoskeletal:  Positive for arthralgias.  All other systems reviewed and are negative.   Updated Vital Signs BP 134/75   Pulse 95   Temp 97.8 F (36.6 C) (Oral)   Resp 16   SpO2 98%   Physical Exam Vitals and nursing note reviewed.   Gen: Appears uncomfortable Eyes: PERRL, EOMI HEENT: no oropharyngeal swelling Neck: trachea midline Resp: clear to auscultation bilaterally Card: RRR, no murmurs, rubs, or gallops Abd: nontender, nondistended Extremities: no calf tenderness, no edema MSK: The patient is tender over the mid and lower lumbar spine with no step-offs or deformities, she is tender over the left and right sacrum with no overlying erythema noted Neuro: The patient has equal strength sensation throughout the bilateral lower extremities Vascular: 2+ radial pulses bilaterally, 2+ DP pulses bilaterally Skin: no rashes Psyc: acting appropriately   (all labs ordered are listed, but only abnormal results are displayed) Labs Reviewed  CBC WITH DIFFERENTIAL/PLATELET - Abnormal; Notable for the following components:      Result Value   Platelets 434 (*)    All other components within normal limits  BASIC METABOLIC PANEL WITH GFR - Abnormal; Notable for the following components:   Glucose, Bld 138 (*)    All other components within normal limits    EKG: None  Radiology:    Procedures   Medications Ordered in the ED  promethazine  (PHENERGAN ) 12.5 mg in sodium chloride  0.9 % 50 mL IVPB (has no administration in time range)  HYDROmorphone  (DILAUDID ) injection 2 mg (2 mg Intravenous Given 10/03/24 2130)  ondansetron  (ZOFRAN ) injection 4 mg (4 mg Intravenous Given 10/03/24 2130)                                    Medical Decision Making 78 year old female with past medical history of sciatica presenting to the emergency department today with low back pain and bilateral hip pain.  Further evaluate the patient here with labs to  evaluate her kidney function to see if she will be amenable to NSAIDs.  Will give her IV Dilaudid .  Will obtain CT scans of her pelvis and lumbar spine for further evaluation for compression deformities or other acute pathology that could be contributing to her symptoms.  She does not have any red flag symptoms for cauda equina syndrome or cord compressing lesion but does seem to be in a fair amount of pain.  Unfortunately, she is on essentially maximum opiate  therapy already.  I will reevaluate for ultimate disposition.  The patient CT scan does show some sacral fractures that are new.  I did speak with Dr. Kit.  Recommends admission here and will have Ortho consult on the patient here at [facility] but there is no plan for  any Ortho intervention at this time  Amount and/or Complexity of Data Reviewed Labs: ordered. Radiology: ordered.  Risk Prescription drug management. Decision regarding hospitalization.        Final diagnoses:  Closed fracture of sacrum, unspecified portion of sacrum, initial encounter [facility])    ED Discharge Orders     None          Ula Prentice SAUNDERS, MD 10/03/24 2329

## 2024-10-03 NOTE — ED Triage Notes (Signed)
 Pt c/o sacral and hip pain worsening over the last few weeks. Pt has hx of sciatica and states pain is similar. No injuries reported per pt.

## 2024-10-03 NOTE — H&P (Signed)
 History and Physical    Patient: Tracey Morgan FMW:[id number] DOB: March 02, 1946 DOA: 10/03/2024 DOS: the patient was seen and examined on 10/03/2024 PCP: Beverley Norleen NOVAK, MD  Patient coming from: Home  Chief Complaint:  Chief Complaint  Patient presents with   Tailbone Pain   HPI: Tracey Morgan is a 78 y.o. female with medical history significant of degenerative disc disease with chronic sciatica, GERD, neuromuscular disorder, borderline diabetes, diverticular disease, essential hypertension, history of scoliosis who is usually following up with orthopedic surgery for the back pain reported to the ER with severe low back pain.  Patient was seen by orthopedics last week started on prednisone  taper dose on oral Dilaudid .  The pain has worsened.  Radiating down bilateral knees.  No fever or chills.  She is having difficulty moving around.  Despite the pain medicines the pain has persisted and gradually getting worse.  She therefore came to the ER for evaluation.  In the ER patient was found to have multiple lumbar degenerative disease with what appears to be S1 involvement.  At this point she is being admitted to the hospital for further evaluation and treatment.  Review of Systems: As mentioned in the history of present illness. All other systems reviewed and are negative. Past Medical History:  Diagnosis Date   Arthritis    back, hips   DDD (degenerative disc disease), lumbar    Diverticulitis    under control   Eczema    Family history of adverse reaction to anesthesia    sister and daughter very slow to wake up   GERD (gastroesophageal reflux disease)    Heart murmur    History of kidney stones    Neuromuscular disorder (HCC)    Rt leg    Pneumonia    as child   PONV (postoperative nausea and vomiting)    Pre-diabetes    Sciatica    Scoliosis    Past Surgical History:  Procedure Laterality Date   ADENOIDECTOMY     APPENDECTOMY  1979   BACK SURGERY  1981   CATARACT  EXTRACTION Left    CESAREAN SECTION  1981   CHOLECYSTECTOMY  1972   COLON SURGERY  2021   sigmoidectomy   GALLBLADDER SURGERY  1979   HIP SURGERY Right 03/2021   TONSILLECTOMY     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   Procedure: TOTAL HIP ARTHROPLASTY ANTERIOR APPROACH;  Surgeon: Melodi Lerner, MD;  Location: [facility];  Service: Orthopedics;  Laterality: Right;    TOTAL HIP ARTHROPLASTY Left 07/27/2024   Procedure: ARTHROPLASTY, HIP, TOTAL, ANTERIOR APPROACH;  Surgeon: Melodi Lerner, MD;  Location: [facility];  Service: Orthopedics;  Laterality: Left;   Social History:  reports that she has never smoked. She has never used smokeless tobacco. She reports that she does not drink alcohol  and does not use drugs.  Allergies  Allergen Reactions   Hydralazine Anaphylaxis and Nausea Only    Patient had nausea and headache with po hydralazine 10mg     Metformin Other (See Comments)    Severe colitis.   Metronidazole  Other (See Comments)    Not specified   Percocet [Oxycodone-Acetaminophen ]     Pt is unsure    Buprenorphine Hcl Nausea And Vomiting, Rash and Other (See Comments)    hallucinations    Codeine Nausea And Vomiting, Rash and Other (See Comments)    Hallucinations   Hydrocodone  Nausea And Vomiting and Rash   Morphine And Codeine Nausea And Vomiting, Rash and  Other (See Comments)    hallucinations    Family History  Problem Relation Age of Onset   Angioedema Father    Eczema Sister    Immunodeficiency Sister    Food Allergy  Niece     Prior to Admission medications   Medication Sig Start Date End Date Taking? Authorizing Provider  acetaminophen  (TYLENOL ) 500 MG tablet Take 500 mg by mouth every 6 (six) hours as needed for headache or moderate pain (pain score 4-6).    [provider]  azelastine  (ASTELIN ) 0.1 % nasal spray USE 1 DOSE IN EACH NOSTRIL TWICE DAILY AS DIRECTED 09/17/22   Cheryl Reusing, FNP  cetirizine  (ZYRTEC ) 10 MG tablet Take 1 tablet by  mouth once daily 11/03/22   Cheryl Reusing, FNP  clobetasol ointment (TEMOVATE) 0.05 % Apply 1 Application topically 2 (two) times daily as needed (irritation). 05/15/21   [provider]  dapagliflozin  propanediol (FARXIGA ) 5 MG TABS tablet Take 10 mg by mouth daily.    [provider]  diphenhydrAMINE  (BENADRYL ) 25 MG tablet Take 25 mg by mouth every 6 (six) hours as needed for allergies or itching.    [provider]  docusate sodium  (COLACE) 100 MG capsule Take 1 capsule (100 mg total) by mouth 2 (two) times daily. 07/28/24   Edmisten, Kristie L, PA  EPINEPHrine  0.3 mg/0.3 mL IJ SOAJ injection Inject 0.3 mg into the muscle as needed for anaphylaxis. 12/05/21   Cheryl Reusing, FNP  famotidine  (PEPCID ) 40 MG tablet Take 40 mg by mouth at bedtime as needed for heartburn or indigestion.    [provider]  fluticasone  (FLONASE ) 50 MCG/ACT nasal spray Place 2 sprays into both nostrils daily.    [provider]  HYDROmorphone  (DILAUDID ) 2 MG tablet Take 1-2 tablets (2-4 mg total) by mouth every 6 (six) hours as needed for moderate pain (pain score 4-6) or severe pain (pain score 7-10). 07/28/24   Edmisten, Kristie L, PA  methocarbamol  (ROBAXIN ) 500 MG tablet Take 1 tablet (500 mg total) by mouth every 6 (six) hours as needed for muscle spasms. 07/28/24   Edmisten, Kristie L, PA  ondansetron  (ZOFRAN ) 4 MG tablet Take 1 tablet (4 mg total) by mouth every 6 (six) hours as needed for nausea. 07/28/24   Edmisten, Kristie L, PA  pantoprazole  (PROTONIX ) 40 MG tablet Take 40 mg by mouth daily.    [provider]  polyethylene glycol (MIRALAX  / GLYCOLAX ) 17 g packet Take 17 g by mouth daily as needed for mild constipation. 07/28/24   Edmisten, Kristie L, PA  POTASSIUM PO Take 99 mg by mouth daily.    [provider]  predniSONE  (STERAPRED UNI-PAK 21 TAB) 5 MG (21) TBPK tablet 4 tablets on 8/23, 3 tablets on 8/24, 2 tablets on 8/25, 1 tablet on 8/26 07/30/24    Edmisten, Kristie L, PA  torsemide  (DEMADEX ) 20 MG tablet Take 20 mg by mouth daily.    [provider]  triamcinolone lotion (KENALOG) 0.1 % Apply 1 Application topically 2 (two) times daily.    [provider]    Physical Exam: Vitals:   10/03/24 2032 10/03/24 2145  BP: (!) 147/82 134/75  Pulse: 95   Resp: 16   Temp: 97.8 F (36.6 C)   TempSrc: Oral   SpO2: 98%    Constitutional: Acutely ill looking in obvious distress, morbidly obese Eyes: PERRL, lids and conjunctivae normal ENMT: Mucous membranes are moist. Posterior pharynx clear of any exudate or lesions.Normal dentition.  Neck:  normal, supple, no masses, no thyromegaly Respiratory: clear to auscultation bilaterally, no wheezing, no crackles. Normal respiratory effort. No accessory muscle use.  Cardiovascular: Regular rate and rhythm, no murmurs / rubs / gallops. No extremity edema. 2+ pedal pulses. No carotid bruits.  Abdomen: no tenderness, no masses palpated. No hepatosplenomegaly. Bowel sounds positive.  Musculoskeletal: Good range of motion, no joint swelling or tenderness, Skin: no rashes, lesions, ulcers. No induration Neurologic: CN 2-12 grossly intact. Sensation intact, DTR normal. Strength 5/5 in all 4.  Positive SLR on the right Psychiatric: Normal judgment and insight. Alert and oriented x 3. Normal mood  Data Reviewed:  Temperature 97.8, blood pressure 147/82,Glucose 138.  CBC within normal.  CT lumbar spine showed nondisplaced longitudinal periarticular fractures of the anterior sacrum bilaterally which is new.  Is some sacral insufficiency fractures.  There is chronic disc collapse at L4-5 as well as disc space loss at L1-2 with left paracentral osteophyte complex.  There is mild to moderate chronic superior endplate compression fractures of L2 and chronic degenerative mild grade 1 anterolisthesis from L3-4 down to L5-S1.  CT is pelvis without contrast showed advanced degenerative disc disease at  L4-5 with chronic mass effect of the right greater than left descending L5 nerve root and mild spinal stenosis.  There is evidence of bilateral hip replacement bilateral and significant osteopenia  Assessment and Plan:  #1 sacral insufficiency fracture: Patient having severe sciatic pain most likely secondary to this fracture.  Pain has not been controlled with outpatient therapy.  Patient will be admitted to the hospital for pain management.  IV pain medications with PT and OT.  Orthopedics already consulted and we will await their recommendations.  In the meantime continue with oral Dilaudid  as well as IV as needed.  She has taken gabapentin  at home and will consider continue.  #2 GERD: Continue with PPIs  #3 essential hypertension: Will confirm home regimen and resume.  #4 borderline diabetes: Not on active treatment.  Hemoglobin A1c is 6.4.  Will start sliding scale insulin  for monitoring.  #5 osteoarthritis: Status post bilateral hip replacements.  Currently stable.  #6 morbid obesity: Dietary counseling.  Continue to monitor    Advance Care Planning:   Code Status: Prior full code  Consults: Orthopedic surgery Dr. Kit  Family Communication: No family at bedside  Severity of Illness: The appropriate patient status for this patient is INPATIENT. Inpatient status is judged to be reasonable and necessary in order to provide the required intensity of service to ensure the patient's safety. The patient's presenting symptoms, physical exam findings, and initial radiographic and laboratory data in the context of their chronic comorbidities is felt to place them at high risk for further clinical deterioration. Furthermore, it is not anticipated that the patient will be medically stable for discharge from the hospital within 2 midnights of admission.   * I certify that at the point of admission it is my clinical judgment that the patient will require inpatient hospital care spanning beyond 2  midnights from the point of admission due to high intensity of service, high risk for further deterioration and high frequency of surveillance required.*  AuthorBETHA SIM KNOLL, MD 10/03/2024 11:56 PM  For on call review [email or web address].

## 2024-10-04 ENCOUNTER — Ambulatory Visit: Admitting: Rehabilitation

## 2024-10-04 ENCOUNTER — Other Ambulatory Visit: Payer: Self-pay

## 2024-10-04 DIAGNOSIS — I10 Essential (primary) hypertension: Secondary | ICD-10-CM | POA: Diagnosis not present

## 2024-10-04 DIAGNOSIS — S3210XA Unspecified fracture of sacrum, initial encounter for closed fracture: Secondary | ICD-10-CM

## 2024-10-04 DIAGNOSIS — K219 Gastro-esophageal reflux disease without esophagitis: Secondary | ICD-10-CM | POA: Diagnosis not present

## 2024-10-04 LAB — COMPREHENSIVE METABOLIC PANEL WITH GFR
ALT: 8 U/L (ref 0–44)
AST: 13 U/L — ABNORMAL LOW (ref 15–41)
Albumin: 3.3 g/dL — ABNORMAL LOW (ref 3.5–5.0)
Alkaline Phosphatase: 172 U/L — ABNORMAL HIGH (ref 38–126)
Anion gap: 9 (ref 5–15)
BUN: 10 mg/dL (ref 8–23)
CO2: 24 mmol/L (ref 22–32)
Calcium: 8.8 mg/dL — ABNORMAL LOW (ref 8.9–10.3)
Chloride: 108 mmol/L (ref 98–111)
Creatinine, Ser: 0.54 mg/dL (ref 0.44–1.00)
GFR, Estimated: >60 mL/min (ref >60)
Glucose, Bld: 127 mg/dL — ABNORMAL HIGH (ref 70–99)
Potassium: 3.7 mmol/L (ref 3.5–5.1)
Sodium: 141 mmol/L (ref 135–145)
Total Bilirubin: 0.7 mg/dL (ref 0.0–1.2)
Total Protein: 5.7 g/dL — ABNORMAL LOW (ref 6.5–8.1)

## 2024-10-04 LAB — CBC
HCT: 36.5 % (ref 36.0–46.0)
Hemoglobin: 11.6 g/dL — ABNORMAL LOW (ref 12.0–15.0)
MCH: 29.7 pg (ref 26.0–34.0)
MCHC: 31.8 g/dL (ref 30.0–36.0)
MCV: 93.4 fL (ref 80.0–100.0)
Platelets: 348 K/uL (ref 150–400)
RBC: 3.91 MIL/uL (ref 3.87–5.11)
RDW: 12.6 % (ref 11.5–15.5)
WBC: 7.8 K/uL (ref 4.0–10.5)
nRBC: 0 % (ref 0.0–0.2)

## 2024-10-04 LAB — GLUCOSE, CAPILLARY
Glucose-Capillary: 95 mg/dL (ref 70–99)
Glucose-Capillary: 88 mg/dL (ref 70–99)
Glucose-Capillary: 100 mg/dL — ABNORMAL HIGH (ref 70–99)
Glucose-Capillary: 112 mg/dL — ABNORMAL HIGH (ref 70–99)

## 2024-10-04 MED ORDER — AZELASTINE HCL 0.1 % NA SOLN
1.0000 | Freq: Two times a day (BID) | NASAL | Status: DC
  Administered 2024-10-04 – 2024-10-11 (×11): 1 via NASAL
  Filled 2024-10-04: qty 30

## 2024-10-04 MED ORDER — PANTOPRAZOLE SODIUM 40 MG PO TBEC
40.0000 mg | DELAYED_RELEASE_TABLET | Freq: Every day | ORAL | Status: DC
  Administered 2024-10-04 – 2024-10-11 (×8): 40 mg via ORAL
  Filled 2024-10-04 (×8): qty 1

## 2024-10-04 MED ORDER — ENOXAPARIN SODIUM 40 MG/0.4ML IJ SOSY
40.0000 mg | PREFILLED_SYRINGE | INTRAMUSCULAR | Status: DC
  Administered 2024-10-04 – 2024-10-11 (×8): 40 mg via SUBCUTANEOUS
  Filled 2024-10-04 (×8): qty 0.4

## 2024-10-04 MED ORDER — LACTATED RINGERS IV SOLN: INTRAVENOUS | Status: AC

## 2024-10-04 MED ORDER — TORSEMIDE 20 MG PO TABS
20.0000 mg | ORAL_TABLET | Freq: Every day | ORAL | Status: DC
  Filled 2024-10-04: qty 1

## 2024-10-04 MED ORDER — FLUTICASONE PROPIONATE 50 MCG/ACT NA SUSP
2.0000 | Freq: Every day | NASAL | Status: DC
  Filled 2024-10-04: qty 16

## 2024-10-04 MED ORDER — LIDOCAINE 5 % EX PTCH
1.0000 | MEDICATED_PATCH | CUTANEOUS | Status: DC
  Administered 2024-10-04 – 2024-10-11 (×8): 1 via TRANSDERMAL
  Filled 2024-10-04 (×8): qty 1

## 2024-10-04 MED ORDER — HYDROMORPHONE HCL 2 MG PO TABS
2.0000 mg | ORAL_TABLET | Freq: Four times a day (QID) | ORAL | Status: DC | PRN
  Administered 2024-10-04 – 2024-10-05 (×7): 2 mg via ORAL
  Administered 2024-10-06 (×3): 4 mg via ORAL
  Administered 2024-10-06: 2 mg via ORAL
  Administered 2024-10-07: 4 mg via ORAL
  Filled 2024-10-04: qty 2
  Filled 2024-10-04 (×4): qty 1
  Filled 2024-10-04 (×2): qty 2
  Filled 2024-10-04 (×2): qty 1
  Filled 2024-10-04 (×2): qty 2

## 2024-10-04 MED ORDER — ACETAMINOPHEN 500 MG PO TABS
1000.0000 mg | ORAL_TABLET | Freq: Three times a day (TID) | ORAL | Status: AC
Start: 2024-10-04 — End: 2024-10-07
  Administered 2024-10-04 – 2024-10-06 (×9): 1000 mg via ORAL
  Filled 2024-10-04 (×9): qty 2

## 2024-10-04 MED ORDER — METHOCARBAMOL 500 MG PO TABS
500.0000 mg | ORAL_TABLET | Freq: Four times a day (QID) | ORAL | Status: DC | PRN
  Administered 2024-10-04: 500 mg via ORAL
  Filled 2024-10-04: qty 1

## 2024-10-04 MED ORDER — DIPHENHYDRAMINE HCL 25 MG PO CAPS: 25.0000 mg | ORAL_CAPSULE | Freq: Four times a day (QID) | ORAL | Status: DC | PRN

## 2024-10-04 MED ORDER — DAPAGLIFLOZIN PROPANEDIOL 10 MG PO TABS
10.0000 mg | ORAL_TABLET | Freq: Every day | ORAL | Status: DC
  Administered 2024-10-04 – 2024-10-11 (×8): 10 mg via ORAL
  Filled 2024-10-04 (×9): qty 1

## 2024-10-04 MED ORDER — POLYETHYLENE GLYCOL 3350 17 G PO PACK
17.0000 g | PACK | Freq: Every day | ORAL | Status: DC | PRN
  Administered 2024-10-04 – 2024-10-07 (×4): 17 g via ORAL
  Filled 2024-10-04 (×5): qty 1

## 2024-10-04 MED ORDER — HYDROMORPHONE HCL 1 MG/ML IJ SOLN
0.5000 mg | INTRAMUSCULAR | Status: DC | PRN
  Filled 2024-10-04: qty 0.5

## 2024-10-04 MED ORDER — METHOCARBAMOL 1000 MG/10ML IJ SOLN
500.0000 mg | Freq: Four times a day (QID) | INTRAMUSCULAR | Status: DC
  Administered 2024-10-04 – 2024-10-11 (×29): 500 mg via INTRAVENOUS
  Filled 2024-10-04 (×28): qty 10

## 2024-10-04 MED ORDER — DOCUSATE SODIUM 100 MG PO CAPS
100.0000 mg | ORAL_CAPSULE | Freq: Two times a day (BID) | ORAL | Status: DC
Start: 2024-10-04 — End: 2024-10-08
  Administered 2024-10-04 – 2024-10-05 (×2): 100 mg via ORAL
  Filled 2024-10-04 (×7): qty 1

## 2024-10-04 MED ORDER — INSULIN ASPART 100 UNIT/ML IJ SOLN
0.0000 [IU] | Freq: Three times a day (TID) | INTRAMUSCULAR | Status: DC
  Filled 2024-10-04: qty 0.06

## 2024-10-04 MED ORDER — LORATADINE 10 MG PO TABS
10.0000 mg | ORAL_TABLET | Freq: Every day | ORAL | Status: DC
Start: 2024-10-04 — End: 2024-10-11
  Administered 2024-10-04 – 2024-10-10 (×7): 10 mg via ORAL
  Filled 2024-10-04 (×8): qty 1

## 2024-10-04 MED ORDER — INSULIN ASPART 100 UNIT/ML IJ SOLN
0.0000 [IU] | Freq: Every day | INTRAMUSCULAR | Status: DC
  Filled 2024-10-04: qty 0.05

## 2024-10-04 MED ORDER — ONDANSETRON HCL 4 MG/2ML IJ SOLN
4.0000 mg | Freq: Four times a day (QID) | INTRAMUSCULAR | Status: DC | PRN
  Administered 2024-10-04 – 2024-10-11 (×20): 4 mg via INTRAVENOUS
  Filled 2024-10-04 (×21): qty 2

## 2024-10-04 MED ORDER — LABETALOL HCL 5 MG/ML IV SOLN
10.0000 mg | INTRAVENOUS | Status: DC | PRN
Start: 2024-10-04 — End: 2024-10-11

## 2024-10-04 MED ORDER — FAMOTIDINE 20 MG PO TABS: 40.0000 mg | ORAL_TABLET | Freq: Every evening | ORAL | Status: DC | PRN

## 2024-10-04 NOTE — Evaluation (Signed)
 Occupational Therapy Evaluation Patient Details Name: Tracey Morgan MRN: [id number] DOB: 15-Feb-1946 Today's Date: 10/04/2024   History of Present Illness   78 y.o. female admitted with low back pain and bilateral hip pain.  CT L-spine and CT pelvis which showed bilateral periarticular longitudinal nondisplaced fracture of the anterior sacrum bilaterally of indeterminate age.  No pelvic hematoma, favored to be sacral insufficiency fractures.  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  Mild to moderate chronic superior endplate compression fracture of L2. PMH: L THA, anterior approach on 07/27/2024 due to failure of conservative measures,. Pt PMH: L THA 07/2024, neuromuscular disorder, GERD, OA, back surgery and R THA (2022).     Clinical Impressions PTA pt lives alone independently and was participating with outpt PT, recently using a RW for mobility due to increased back pain. Pt had just returned to bed after attempting to use the bedpan however it was too painful to sit on the [facility]. Pt able to roll in bed for placement of a fracture bedpan with improved pain tolerance - nsg made aware. Pt demonstrates a functional decline as noted below due to pain and generalized weakness and will benefit from continued inpatient follow up therapy, <3 hours/day to maximize functional level of independence with goal to return home. Acute OT to follow to facilitate DC to next venue of care.      If plan is discharge home, recommend the following:   A lot of help with walking and/or transfers;A lot of help with bathing/dressing/bathroom;Assistance with cooking/housework;Assist for transportation;Help with stairs or ramp for entrance     Functional Status Assessment   Patient has had a recent decline in their functional status and demonstrates the ability to make significant improvements in function in a reasonable and predictable amount of time.     Equipment Recommendations    BSC/3in1     Recommendations for Other Services         Precautions/Restrictions   Precautions Precautions: Fall Precaution/Restrictions Comments: back precautions used to hlep with pain/log rolling Required Braces or Orthoses:  (May benefit from lumbar corsett) Restrictions Weight Bearing Restrictions Per Provider Order: No     Mobility Bed Mobility Overal bed mobility: Needs Assistance Bed Mobility: Rolling Rolling: Supervision; heavy use of bedrail         General bed mobility comments: ableto roll side to side    Transfers                   General transfer comment: declined; had just returned to bed with assistance of nursing who states pt was min A @ RW level      Balance Overall balance assessment:  (no history of falls)                                         ADL either performed or assessed with clinical judgement   ADL Overall ADL's : Needs assistance/impaired Eating/Feeding: Independent   Grooming: Set up;Bed level   Upper Body Bathing: Minimal assistance;Bed level   Lower Body Bathing: Maximal assistance;Bed level   Upper Body Dressing : Set up;Sitting   Lower Body Dressing: Maximal assistance;Bed level       Toileting- Clothing Manipulation and Hygiene: Maximal assistance;Bed level Toileting - Clothing Manipulation Details (indicate cue type and reason): unableto tolerate sitting on commode; used a fracture pan with log rolling side  to side to place bed pan     Functional mobility during ADLs:  (declined; had previously tried to use the [facility] with nursing and just returned to bed; states she was able to stand pivot and get to the [facility] however could not tolerate sitting on the commode)       Vision Baseline Vision/History: 1 Wears glasses Vision Assessment?: Wears glasses for reading     Perception         Praxis         Pertinent Vitals/Pain Pain Assessment Pain Assessment: 0-10 Pain Score: 8  Pain  Location: pelvis/hips (L ribs) Pain Descriptors / Indicators: Aching (hard) Pain Intervention(s): Limited activity within patient's tolerance, Other (comment) (lidocain patch)     Extremity/Trunk Assessment Upper Extremity Assessment Upper Extremity Assessment: Right hand dominant;Overall WFL for tasks assessed   Lower Extremity Assessment Lower Extremity Assessment: Defer to PT evaluation   Cervical / Trunk Assessment Cervical / Trunk Assessment: Other exceptions (back pain; hx of back surgery)   Communication Communication Communication: No apparent difficulties   Cognition Arousal: Alert Behavior During Therapy: WFL for tasks assessed/performed Cognition: No apparent impairments                                       Cueing  General Comments          Exercises     Shoulder Instructions      Home Living Family/patient expects to be discharged to:: Skilled nursing facility Living Arrangements: Alone                                      Prior Functioning/Environment Prior Level of Function : Independent/Modified Independent             Mobility Comments: Has been using a RW since back has been bothering her; was using her cane prior to taht ADLs Comments: was struggling to complete; cousin was assisting as needed    OT Problem List: Decreased strength;Decreased range of motion;Decreased activity tolerance;Impaired balance (sitting and/or standing);Decreased knowledge of use of DME or AE;Decreased knowledge of precautions;Pain   OT Treatment/Interventions: Self-care/ADL training;Therapeutic exercise;DME and/or AE instruction;Therapeutic activities;Patient/family education;Balance training      OT Goals(Current goals can be found in the care plan section)   Acute Rehab OT Goals Patient Stated Goal: pain control, walk, get better OT Goal Formulation: With patient Time For Goal Achievement: 10/18/24 Potential to Achieve  Goals: Good   OT Frequency:  Min 2X/week    Co-evaluation PT/OT/SLP Co-Evaluation/Treatment: Yes (partial) Reason for Co-Treatment: To address functional/ADL transfers   OT goals addressed during session: ADL's and self-care      AM-PAC OT 6 Clicks Daily Activity     Outcome Measure Help from another person eating meals?: None Help from another person taking care of personal grooming?: A Little Help from another person toileting, which includes using toliet, bedpan, or urinal?: A Lot Help from another person bathing (including washing, rinsing, drying)?: A Lot Help from another person to put on and taking off regular upper body clothing?: A Little Help from another person to put on and taking off regular lower body clothing?: A Lot 6 Click Score: 16   End of Session Nurse Communication: Mobility status;Other (comment) (pt on bed pan)  Activity Tolerance: Patient limited by pain Patient left:  in bed;with call bell/phone within reach  OT Visit Diagnosis: Unsteadiness on feet (R26.81);Muscle weakness (generalized) (M62.81);Pain;Other abnormalities of gait and mobility (R26.89) Pain - part of body:  (back/ sacrum)                Time: 8863-8840 OT Time Calculation (min): 23 min Charges:  OT General Charges $OT Visit: 1 Visit OT Evaluation $OT Eval Low Complexity: 1 Low  Edy Belt, OT/L   Acute OT Clinical Specialist Acute Rehabilitation Services Pager [phone] Office [phone]   [facility] 10/04/2024, 4:08 PM

## 2024-10-04 NOTE — Plan of Care (Signed)
  Problem: Health Behavior/Discharge Planning: Goal: Ability to identify and utilize available resources and services will improve Outcome: Progressing Goal: Ability to manage health-related needs will improve Outcome: Progressing

## 2024-10-04 NOTE — Evaluation (Signed)
 Physical Therapy Evaluation Patient Details Name: Tracey Morgan MRN: [id number] DOB: 26-Jul-1946 Today's Date: 10/04/2024  History of Present Illness  78 y.o. female admitted with low back pain and bilateral hip pain.  CT L-spine and CT pelvis which showed bilateral periarticular longitudinal nondisplaced fracture of the anterior sacrum bilaterally of indeterminate age.  No pelvic hematoma, favored to be sacral insufficiency fractures.  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  Mild to moderate chronic superior endplate compression fracture of L2.s/p L THA, anterior approach on 07/27/2024 due to failure of conservative measures,. Pt PMH: L THA 07/2024, neuromuscular disorder, GERD, OA, back surgery and R THA (2022).  Clinical Impression  Pt admitted with above diagnosis.  Pt currently with functional limitations due to the deficits listed below (see PT Problem List). Pt will benefit from acute skilled PT to increase their independence and safety with mobility to allow discharge.      The patient reports that she just stood and attempted sitting on BSC and could not tolerate and deferred to try again. Patient is able to roll slowly to the left and back to supine after bedpan placed.  Patient resides alone and has been driving, going to OPPT for sciatica that was prompted after LTHA.   Patient  currently very limited due to reports of pain in the sacral areas. Patient will benefit from continued inpatient follow up therapy, <3 hours/day       If plan is discharge home, recommend the following: Two people to help with walking and/or transfers;A lot of help with bathing/dressing/bathroom;Assist for transportation;Help with stairs or ramp for entrance;Assistance with cooking/housework   Can travel by private vehicle   No    Equipment Recommendations None recommended by PT  Recommendations for Other Services       Functional Status Assessment Patient has had a recent decline  in their functional status and demonstrates the ability to make significant improvements in function in a reasonable and predictable amount of time.     Precautions / Restrictions Precautions Precautions: Fall Precaution/Restrictions Comments: back precautions used to hlep with pain/log rolling Restrictions Weight Bearing Restrictions Per Provider Order: No      Mobility  Bed Mobility   Bed Mobility: Rolling Rolling: Supervision         General bed mobility comments: pillow placed between knees to roll    Transfers                   General transfer comment: declined    Ambulation/Gait                  Stairs            Wheelchair Mobility     Tilt Bed    Modified Rankin (Stroke Patients Only)       Balance                                             Pertinent Vitals/Pain Pain Assessment Pain Score: 8  Pain Location: pelvis/hips Pain Descriptors / Indicators: Aching Pain Intervention(s): Limited activity within patient's tolerance, Premedicated before session, Monitored during session    Home Living Family/patient expects to be discharged to:: Skilled nursing facility Living Arrangements: Alone                      Prior  Function Prior Level of Function : Independent/Modified Independent             Mobility Comments: Has been using a RW since back has been bothering her; was using her cane prior to taht ADLs Comments: was struggling to complete; cousin was assisting as needed     Extremity/Trunk Assessment   Upper Extremity Assessment Upper Extremity Assessment: Right hand dominant;Overall WFL for tasks assessed    Lower Extremity Assessment Lower Extremity Assessment: RLE deficits/detail;LLE deficits/detail RLE Deficits / Details: able to slowly heel slide to hook lying position LLE Deficits / Details: moves slower  can  move to hip and knee flexion    Cervical / Trunk  Assessment Cervical / Trunk Assessment: Other exceptions  Communication   Communication Communication: No apparent difficulties    Cognition Arousal: Alert Behavior During Therapy: WFL for tasks assessed/performed, Anxious   PT - Cognitive impairments: No apparent impairments                         Following commands: Intact       Cueing       General Comments General comments (skin integrity, edema, etc.): no falls    Exercises     Assessment/Plan    PT Assessment Patient needs continued PT services  PT Problem List Decreased strength;Decreased range of motion;Decreased knowledge of use of DME;Decreased activity tolerance;Decreased safety awareness;Decreased balance;Decreased knowledge of precautions;Decreased mobility;Pain       PT Treatment Interventions DME instruction;Gait training;Patient/family education;Functional mobility training;Therapeutic activities;Therapeutic exercise;Balance training    PT Goals (Current goals can be found in the Care Plan section)  Acute Rehab PT Goals Patient Stated Goal: no pain and wal;k PT Goal Formulation: With patient Time For Goal Achievement: 10/18/24 Potential to Achieve Goals: Fair    Frequency Min 3X/week     Co-evaluation PT/OT/SLP Co-Evaluation/Treatment: Yes Reason for Co-Treatment: To address functional/ADL transfers PT goals addressed during session: Mobility/safety with mobility OT goals addressed during session: ADL's and self-care       AM-PAC PT 6 Clicks Mobility  Outcome Measure Help needed turning from your back to your side while in a flat bed without using bedrails?: A Little Help needed moving from lying on your back to sitting on the side of a flat bed without using bedrails?: Total Help needed moving to and from a bed to a chair (including a wheelchair)?: Total Help needed standing up from a chair using your arms (e.g., wheelchair or bedside chair)?: Total Help needed to walk in  hospital room?: Total Help needed climbing 3-5 steps with a railing? : Total 6 Click Score: 8    End of Session   Activity Tolerance: Patient tolerated treatment well;Patient limited by pain Patient left: in bed;with call bell/phone within reach (OT in room) Nurse Communication: Mobility status PT Visit Diagnosis: Unsteadiness on feet (R26.81);Other abnormalities of gait and mobility (R26.89);Pain    Time: 1528-1550 PT Time Calculation (min) (ACUTE ONLY): 22 min   Charges:   PT Evaluation $PT Eval Low Complexity: 1 Low   PT General Charges $$ ACUTE PT VISIT: 1 Visit         Darice Potters PT Acute Rehabilitation Services Office [phone]  Potters Darice Norris 10/04/2024, 4:50 PM

## 2024-10-04 NOTE — Progress Notes (Signed)
 [facility]                                                                              Tyhesha Dutson, is a 78 y.o. female, DOB - Apr 30, 1946, FMW:[id number] Admit date - 10/03/2024    Outpatient Primary MD for the patient is Beverley Norleen NOVAK, MD  LOS - 1  days  Chief Complaint  Patient presents with   Tailbone Pain       Brief summary   Patient is a 78 year old female with degenerative disc disease of chronic cervical, GERD, neuromuscular disorder, borderline diabetes, diverticular disease, HTN, history of scoliosis presented to ED with severe low back pain.  Patient Dors that she has been following with orthopedic surgery outpatient, was seen last week and started on prednisone  tapering dose and oral Dilaudid  however pain continued to worsen.  Radiating down to bilateral knees, having difficulty ambulating. Patient was admitted for further workup Patient had a CT L-spine and CT pelvis which showed bilateral periarticular longitudinal nondisplaced fracture of the anterior sacrum bilaterally of indeterminate age.  No pelvic hematoma, favored to be sacral insufficiency fractures.  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  Mild to moderate chronic superior endplate compression fracture of L2.  Assessment & Plan     Sacral fracture (HCC) -Per patient, no mechanical fall and has been following with orthopedics outpatient. - CT pelvis showed sacral fracture, chronic disc collapse at L4-L5, mild disc space loss at L1-L2, mild to moderate chronic superior endplate compression fracture of L2 - Continue pain control, PT OT - Started on scheduled Tylenol , continue IV Dilaudid  as needed for severe or breakthrough pain, p.o. Dilaudid , Robaxin .  Add bowel regimen. - Per admitting note, orthopedics has been consulted.    Primary osteoarthritis of right hip -History of bilateral hip replacements, no acute issues  GERD - Continue PPI   Borderline  diabetes -Hemoglobin A1c 6.4 - Continue SSI, sensitive.  Continue Farxiga    HTN - Only on torsemide  at home, per patient's request, hold off on torsemide  today - Placed on labetalol  IV as needed with parameters   Obesity class I Estimated body mass index is 32.01 kg/m as calculated from the following:   Height as of this encounter: 5' 2 (1.575 m).   Weight as of this encounter: 79.4 kg.  Code Status: Full code DVT Prophylaxis:  enoxaparin (LOVENOX) injection 40 mg Start: 10/04/24 1000   Level of Care: Level of care: Med-Surg Family Communication: Updated patient's Disposition Plan:      Remains inpatient appropriate:   Patient lives at home by herself, PT OT evaluation pending.  She feels that she will do well in short-term rehab, previously had gone to [facility] after hip replacement.   Procedures:    Consultants:   Orthopedics  Antimicrobials:   Anti-infectives (From admission, onward)    None          Medications  acetaminophen   1,000 mg Oral TID   azelastine   1 spray Each Nare BID   dapagliflozin  propanediol  10 mg Oral Daily   docusate sodium   100 mg Oral BID   enoxaparin (  LOVENOX) injection  40 mg Subcutaneous Q24H   fluticasone   2 spray Each Nare Daily   insulin  aspart  0-5 Units Subcutaneous QHS   insulin  aspart  0-6 Units Subcutaneous TID WC   lidocaine   1 patch Transdermal Q24H   loratadine  10 mg Oral Daily   methocarbamol  (ROBAXIN ) injection  500 mg Intravenous Q6H   pantoprazole   40 mg Oral Daily      Subjective:   Keelin Neville was seen and examined today.  In significant back pain, 8/10, very uncomfortable.  No acute nausea vomiting, fever chills, chest pain or shortness of breath. + Constipation No acute events overnight.    Objective:   Vitals:   10/04/24 0048 10/04/24 0400 10/04/24 0741 10/04/24 0807  BP: (!) 154/88 (!) 155/81 (!) 150/76 (!) 140/52  Pulse: 93 66 73 76  Resp: 18 18 18 20   Temp: 97.6 F (36.4 C) 98.2 F (36.8 C)  98.1 F (36.7 C) 98 F (36.7 C)  TempSrc: Oral Oral Oral Oral  SpO2: 100% 93% 100% 100%  Weight:   79.4 kg   Height:   5' 2 (1.575 m)     Intake/Output Summary (Last 24 hours) at 10/04/2024 1211 Last data filed at 10/04/2024 9385 Gross per 24 hour  Intake 93.16 ml  Output --  Net 93.16 ml     Wt Readings from Last 3 Encounters:  10/04/24 79.4 kg  07/27/24 83.9 kg  07/14/24 83.9 kg     Exam General: Alert and oriented x 3, NAD Cardiovascular: S1 S2 auscultated,  RRR Respiratory: Clear to auscultation bilaterally, no wheezing, rales or rhonchi Gastrointestinal: Soft, nontender, nondistended, + bowel sounds Ext: no pedal edema bilaterally Neuro: Strength 5/5 upper and lower extremities bilaterally Skin: No rashes Psych: Normal affect     Data Reviewed:  I have personally reviewed following labs    CBC Lab Results  Component Value Date   WBC 7.8 10/04/2024   RBC 3.91 10/04/2024   HGB 11.6 (L) 10/04/2024   HCT 36.5 10/04/2024   MCV 93.4 10/04/2024   MCH 29.7 10/04/2024   PLT 348 10/04/2024   MCHC 31.8 10/04/2024   RDW 12.6 10/04/2024   LYMPHSABS 1.2 10/03/2024   MONOABS 0.5 10/03/2024   EOSABS 0.1 10/03/2024   BASOSABS 0.0 10/03/2024     Last metabolic panel Lab Results  Component Value Date   NA 141 10/04/2024   K 3.7 10/04/2024   CL 108 10/04/2024   CO2 24 10/04/2024   BUN 10 10/04/2024   CREATININE 0.54 10/04/2024   GLUCOSE 127 (H) 10/04/2024   GFRNONAA >60 10/04/2024   GFRAA >60 05/06/2019   CALCIUM 8.8 (L) 10/04/2024   PROT 5.7 (L) 10/04/2024   ALBUMIN 3.3 (L) 10/04/2024   BILITOT 0.7 10/04/2024   ALKPHOS 172 (H) 10/04/2024   AST 13 (L) 10/04/2024   ALT 8 10/04/2024   ANIONGAP 9 10/04/2024    CBG (last 3)  Recent Labs    10/04/24 0854 10/04/24 1208  GLUCAP 95 88      Coagulation Profile: No results for input(s): INR, PROTIME in the last 168 hours.   Radiology Studies: I have personally reviewed the imaging studies  CT  PELVIS WO CONTRAST Addendum Date: 10/03/2024 ADDENDUM: In retrospect, there are bilateral periarticular longitudinal nondisplaced fractures of the anterior sacrum new from 04/27/2024 but of indeterminate age. no pelvic hematoma is associated with this. These are favored to be sacral insufficiency fractures. ---------------------------------------------------- Electronically signed by: Francis Quam MD 10/03/2024  11:03 PM EDT RP Workstation: [id number]   Result Date: 10/03/2024  ORIGINAL REPORT  EXAM: CT Pelvis, Without IV Contrast 10/03/2024 10:07:00 PM TECHNIQUE: Axial images were acquired through the pelvis without IV contrast. Reformatted images were reviewed. Automated exposure control, iterative reconstruction, and/or weight based adjustment of the mA/kV was utilized to reduce the radiation dose to as low as reasonably achievable. COMPARISON: CT abdomen and pelvis with contrast 04/27/2024. CLINICAL HISTORY: Pelvic pain, chronic, post-menopausal. Per chart: Pt c/o sacral and hip pain worsening over the last few weeks. Pt has hx of sciatica and states pain is similar. No injuries reported per pt. FINDINGS: BONES: There is osteopenia. No acute fracture or focal pathologic lesion is seen in the visualized lower lumbar spine, sacrum, bony pelvis, and visualized proximal femurs. At L4-L5, again noted is advanced degenerative disc disease, anterior bridging osteophytes, and posterior osteophytes lateralizing to the right with chronic mass effect on the right greater than left descending L5 nerve roots and mild acquired spinal stenosis. There is left greater than right foraminal stenosis at this level. Advanced left and moderate right facet hypertrophy also. This was seen previously. JOINTS: No dislocation. There is spray artifact related to bilateral hip replacements, interval left hip replacement since 04/27/2024. There is chronic degenerative sclerosis overlying the superior left acetabular borders. SOFT  TISSUES: There is a small umbilical fat hernia, and small midline and right paramedian infraumbilical fat hernias. There is no incarcerated hernia. No acute soft tissue abnormality is seen in the pelvis. INTRAPELVIC CONTENTS: The pelvic bowel is unremarkable aside from uncomplicated sigmoid diverticulosis and a rectosigmoid surgical colocolonic anastomosis. Limited images of the intrapelvic contents demonstrate no acute abnormality. IMPRESSION: 1. No acute osseous abnormality. 2. Advanced degenerative disc disease at L4-L5 with chronic mass effect on the right greater than left descending L5 nerve roots and mild acquired spinal stenosis. Left greater than right foraminal stenosis at this level. Advanced left and moderate right facet hypertrophy. 3. Bilateral hip replacements with interval left hip replacement since 04/27/2024. 4. Osteopenia and degenerative change. 5. Anterior wall fat  hernias described above. Electronically signed by: Francis Quam MD 10/03/2024 10:34 PM EDT RP Workstation: [id number]   CT Lumbar Spine Wo Contrast Result Date: 10/03/2024 EXAM: CT OF THE LUMBAR SPINE WITHOUT CONTRAST 10/03/2024 10:07:00 PM TECHNIQUE: CT of the lumbar spine was performed without the administration of intravenous contrast. Multiplanar reformatted images are provided for review. Automated exposure control, iterative reconstruction, and/or weight based adjustment of the mA/kV was utilized to reduce the radiation dose to as low as reasonably achievable. COMPARISON: CT of the abdomen and pelvis, and reconstructions 04/27/2024. CLINICAL HISTORY: Low back pain, increased fracture risk. Per chart: Pt c/o sacral and hip pain worsening over the last few weeks. Pt has hx of sciatica and states pain is similar. No injuries reported per pt. FINDINGS: BONES AND ALIGNMENT: Normal vertebral body heights. There is generalized osteopenia. No new alignment abnormality. Chronic degenerative mild grade 1 anterolisthesis is again  noted from L3-L4 down and there is a mild dextroscoliosis apex at L2. The lumbar segmentation is standard. New from 04/27/2024 there are nondisplaced longitudinal periarticular fractures of the anterior sacrum bilaterally, best seen on series 4 axial images 103 through 116. Possible sacral insufficiency fractures. There is a mild to moderate chronic upper plate compression fracture of L2, unchanged. . . No new lumbar fracture and no focal pathologic bone lesion. Both SI joints are patent. DEGENERATIVE CHANGES: At T11-T12 and T12-L1, the discs are normal in height. There  is no herniation, canal or foraminal stenosis. At L1-L2 there is mild disc space loss. There is a left paracentral to far lateral disc osteophyte complex partially effacing the left subarticular zone, suggested mild mass effect on the left L2 nerve root. There is mild facet spurring left greater than right without foraminal compromise. At L2-L3, the disc is normal in height. There is facet joint spurring and dorsal ligamentous hypertrophy and a mild generalized disc bulge but no herniation or canal stenosis. There is mild facet hypertrophy without foraminal compromise. At L3-L4 the disc is normal in height. There is moderate facet hypertrophy and mild ligament thickening but no significant disc bulge and no herniation, canal or foraminal stenosis. At L4-L5 there is chronic disc collapse with interbody ankylosis over portions and vacuum phenomenon. There are bidirectional endplate osteophytes, posteriorly are eccentric to the right with effacement of the bilateral subarticular zones, more so on the right and mild to moderate spinal canal stenosis. There is advanced left and moderate right facet hypertrophy. There is moderate to severe left and mild right foraminal stenosis. At L5-S1, there is mild disc space loss. The facet joints are hypertrophic and ankylosed. There is mild unilateral right foraminal stenosis. No bulge, herniation or canal stenosis.  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 There is aortic atherosclerosis. No acute paravertebral soft tissue findings. The patient is status post cholecystectomy with no biliary dilatation. IMPRESSION: 1. Nondisplaced longitudinal periarticular fractures of the anterior sacrum bilaterally, new from 04/27/2024, favored sacral insufficiency fractures. 2. Chronic disc collapse at L4-5 with interbody ankylosis over portions and bidirectional endplate osteophytes, resulting in acquired spinal canal and subarticular zone stenosis and moderate to severe left and mild right foraminal stenosis. 3. Mild disc space loss at L1-2 with a left paracentral to far lateral disc osteophyte complex partially effacing the left subarticular zone, with suggested mild mass effect on the left L2 nerve root. 4. Mild to moderate chronic superior endplate compression fracture of L2, unchanged. 5. Chronic degenerative mild grade 1 anterolisthesis from L3-4 down and mild dextroscoliosis with apex at L2. 6. Mild disc space loss at L5-S1 with hypertrophic and ankylosed facet joints, resulting in mild unilateral right foraminal stenosis. Electronically signed by: Francis Quam MD 10/03/2024 11:01 PM EDT RP Workstation: [id number]       Nydia Distance M.D. [facility] 10/04/2024, 12:11 PM  Available via Epic secure chat 7am-7pm After 7 pm, please refer to night coverage provider listed on amion.

## 2024-10-05 DIAGNOSIS — E1169 Type 2 diabetes mellitus with other specified complication: Secondary | ICD-10-CM

## 2024-10-05 DIAGNOSIS — M169 Osteoarthritis of hip, unspecified: Secondary | ICD-10-CM

## 2024-10-05 DIAGNOSIS — I159 Secondary hypertension, unspecified: Secondary | ICD-10-CM | POA: Diagnosis not present

## 2024-10-05 DIAGNOSIS — M1611 Unilateral primary osteoarthritis, right hip: Secondary | ICD-10-CM

## 2024-10-05 DIAGNOSIS — K219 Gastro-esophageal reflux disease without esophagitis: Secondary | ICD-10-CM | POA: Diagnosis not present

## 2024-10-05 DIAGNOSIS — S3219XA Other fracture of sacrum, initial encounter for closed fracture: Secondary | ICD-10-CM | POA: Diagnosis not present

## 2024-10-05 LAB — BASIC METABOLIC PANEL WITH GFR
Anion gap: 9 (ref 5–15)
BUN: 9 mg/dL (ref 8–23)
CO2: 25 mmol/L (ref 22–32)
Calcium: 9.4 mg/dL (ref 8.9–10.3)
Chloride: 105 mmol/L (ref 98–111)
Creatinine, Ser: 0.68 mg/dL (ref 0.44–1.00)
GFR, Estimated: >60 mL/min (ref >60)
Glucose, Bld: 91 mg/dL (ref 70–99)
Potassium: 3.6 mmol/L (ref 3.5–5.1)
Sodium: 139 mmol/L (ref 135–145)

## 2024-10-05 LAB — CBC
HCT: 37.6 % (ref 36.0–46.0)
Hemoglobin: 11.4 g/dL — ABNORMAL LOW (ref 12.0–15.0)
MCH: 28.6 pg (ref 26.0–34.0)
MCHC: 30.3 g/dL (ref 30.0–36.0)
MCV: 94.2 fL (ref 80.0–100.0)
Platelets: 333 K/uL (ref 150–400)
RBC: 3.99 MIL/uL (ref 3.87–5.11)
RDW: 12.8 % (ref 11.5–15.5)
WBC: 5.6 K/uL (ref 4.0–10.5)
nRBC: 0 % (ref 0.0–0.2)

## 2024-10-05 LAB — GLUCOSE, CAPILLARY
Glucose-Capillary: 101 mg/dL — ABNORMAL HIGH (ref 70–99)
Glucose-Capillary: 108 mg/dL — ABNORMAL HIGH (ref 70–99)
Glucose-Capillary: 86 mg/dL (ref 70–99)
Glucose-Capillary: 93 mg/dL (ref 70–99)

## 2024-10-05 MED ORDER — FLUTICASONE PROPIONATE 50 MCG/ACT NA SUSP
2.0000 | Freq: Every day | NASAL | Status: DC
  Filled 2024-10-05: qty 16

## 2024-10-05 MED ORDER — FLUTICASONE PROPIONATE 50 MCG/ACT NA SUSP
2.0000 | Freq: Every day | NASAL | Status: DC
  Administered 2024-10-05 – 2024-10-10 (×6): 2 via NASAL
  Filled 2024-10-05: qty 16

## 2024-10-05 NOTE — Progress Notes (Signed)
 Physical Therapy Treatment Patient Details Name: Tracey Morgan MRN: [id number] DOB: [birth year] Today's Date: 10/05/2024   History of Present Illness 78 y.o. female admitted with low back pain and bilateral hip pain.  CT L-spine and CT pelvis which showed bilateral periarticular longitudinal nondisplaced fracture of the anterior sacrum bilaterally of indeterminate age.  No pelvic hematoma, favored to be sacral insufficiency fractures.  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  Mild to moderate chronic superior endplate compression fracture of L2.s/p L THA, anterior approach on 07/27/2024 due to failure of conservative measures,. Pt PMH: L THA 07/2024, neuromuscular disorder, GERD, OA, back surgery and R THA (2022).    PT Comments  The patient reports pain continues in posterior thigh into buttocks, reports ice does assist I a small pain relief. Patient recently medicated with Robaxin . Patient able to roll self and then required assistance with legs  to bed edge, patient able to push to sitting with HOB raised to facilitate. Patient maximally reliant on UEs for support with increased pain with downward   pressure on pelvis. Patient stood briefly at [facility] with reports of intense pain in the buttocks.  Assisted patient back to supine with ice packs to buttocks. Patient will benefit from continued inpatient follow up therapy, <3 hours/day     If plan is discharge home, recommend the following: Two people to help with walking and/or transfers;A lot of help with bathing/dressing/bathroom;Assist for transportation;Help with stairs or ramp for entrance;Assistance with cooking/housework   Can travel by private vehicle     No  Equipment Recommendations  None recommended by PT    Recommendations for Other Services       Precautions / Restrictions Precautions Precautions: Fall Precaution/Restrictions Comments: back precautions used to help with pain/log rolling     Mobility  Bed  Mobility   Bed Mobility: Rolling, Sidelying to Sit, Sit to Sidelying Rolling: Contact guard assist Sidelying to sit: Contact guard assist     Sit to sidelying: Mod assist General bed mobility comments: patient slowly rolls to left side using flexed knees and reaches for rail, HOB raised to  lift trunk, patient slowly able  to move legs over bed edge and push to sitting with mod support. Patient scooted to bed edge. Assisted both legs back onto bed to sidelying to return to sidelying, remained on side    Transfers Overall transfer level: Needs assistance Equipment used: Rolling walker (2 wheels) Transfers: Sit to/from Stand Sit to Stand: Mod assist, +2 safety/equipment, +2 physical assistance           General transfer comment: bed raised, stood at RW, unable to tolerate WB to step to [facility] with complaints  of pain in buttocks    Ambulation/Gait                   Stairs             Wheelchair Mobility     Tilt Bed    Modified Rankin (Stroke Patients Only)       Balance Overall balance assessment: Needs assistance Sitting-balance support: Feet supported, Bilateral upper extremity supported Sitting balance-Leahy Scale: Fair Sitting balance - Comments: reliant on the UE's   for trunk support,  when relaeses UE support, reports increased pain and requested to return to  sidelying.   Standing balance support: Bilateral upper extremity supported, During functional activity, Reliant on assistive device for balance Standing balance-Leahy Scale: Poor  Communication Communication Communication: No apparent difficulties  Cognition Arousal: Alert Behavior During Therapy: WFL for tasks assessed/performed, Anxious   PT - Cognitive impairments: No apparent impairments                         Following commands: Intact      Cueing    Exercises Other Exercises Other Exercises: encouraged heel slides in  supine.    General Comments        Pertinent Vitals/Pain Pain Assessment Pain Score: 9  Pain Location: relates pain is posterior thighs and  about ishial tuberosity when seated and 'Left side ribs.  I  heard /felt a pop yesterday when I tried to get up yesterday Pain Descriptors / Indicators: Aching, Discomfort, Crying, Grimacing, Guarding Pain Intervention(s): Monitored during session, Premedicated before session, Repositioned, Ice applied    Home Living                          Prior Function            PT Goals (current goals can now be found in the care plan section) Progress towards PT goals: Progressing toward goals    Frequency    Min 3X/week      PT Plan      Co-evaluation              AM-PAC PT 6 Clicks Mobility   Outcome Measure  Help needed turning from your back to your side while in a flat bed without using bedrails?: A Little Help needed moving from lying on your back to sitting on the side of a flat bed without using bedrails?: A Lot Help needed moving to and from a bed to a chair (including a wheelchair)?: A Lot Help needed standing up from a chair using your arms (e.g., wheelchair or bedside chair)?: A Lot Help needed to walk in hospital room?: Total Help needed climbing 3-5 steps with a railing? : Total 6 Click Score: 11    End of Session Equipment Utilized During Treatment: Gait belt Activity Tolerance: Patient limited by pain Patient left: in bed;with call bell/phone within reach Nurse Communication: Mobility status PT Visit Diagnosis: Unsteadiness on feet (R26.81);Other abnormalities of gait and mobility (R26.89);Pain Pain - part of body: Leg     Time: 8882-8863 PT Time Calculation (min) (ACUTE ONLY): 19 min  Charges:    $Therapeutic Activity: 8-22 mins PT General Charges $$ ACUTE PT VISIT: 1 Visit                     Darice Potters PT Acute Rehabilitation Services Office [phone]    Potters Darice Norris 10/05/2024, 1:46 PM

## 2024-10-05 NOTE — Plan of Care (Signed)

## 2024-10-05 NOTE — Progress Notes (Signed)
 PROGRESS NOTE    Tracey Morgan  FMW:[id number] DOB: 08-26-1946 DOA: 10/03/2024 PCP: Beverley Norleen NOVAK, MD   Brief Narrative:  Patient is a 78 year old female with degenerative disc disease of chronic cervical, GERD, neuromuscular disorder, borderline diabetes, diverticular disease, HTN, history of scoliosis presented to ED with severe low back pain.  Patient Dors that she has been following with orthopedic surgery outpatient, was seen last week and started on prednisone  tapering dose and oral Dilaudid  however pain continued to worsen.  Radiating down to bilateral knees, having difficulty ambulating.  Hospitalist called for further evaluation.  Assessment & Plan:   Principal Problem:   Sacral fracture (HCC) Active Problems:   OA (osteoarthritis) of hip   Primary osteoarthritis of right hip   Gastro-esophageal reflux disease without esophagitis   Hypertension   Morbid obesity (HCC)   Type 2 diabetes mellitus (HCC)   Acute versus subacute sacral fracture (HCC) Ambulatory dysfunction, acute on chronic - No reported fall or trauma, orthopedics consulted and following, appreciate insight recommendations -Orthopedics following, unfortunately no surgical intervention/options at this time - PT OT continue to work with patient to improve ambulatory dysfunction - On current regimen including p.o. and IV Dilaudid  she appears her somewhat well-controlled today; continue Robaxin  and bowel regimen.   Primary osteoarthritis of right hip -History of bilateral hip replacements, no acute issues GERD - Continue PPI Pre-diabetes -Hemoglobin A1c 6.4 - Continue SSI, sensitive.  Continue Farxiga  HTN-hold home torsemide , as needed labetalol  as needed  Obesity class I Body mass index is 32.01 kg/m.  DVT prophylaxis: enoxaparin (LOVENOX) injection 40 mg Start: 10/04/24 1000 Code Status:   Code Status: Full Code Family Communication: At bedside  Status is: Inpatient  Dispo: The patient is from:  Home              Anticipated d/c is to: To be determined              Anticipated d/c date is: To be determined              Patient currently not medically stable for discharge  Consultants:  Orthopedic surgery  Procedures:  None  Antimicrobials:  None indicated  Subjective: No acute issues or events overnight, continues to tolerate PT quite poorly in the setting of pain.  Minimally improving from prior, patient hopeful for possible procedure with orthopedics but this is a nonoperative fracture.  She otherwise denies nausea vomiting diarrhea constipation headache fevers chills or chest pain.  Objective: Vitals:   10/04/24 0807 10/04/24 1327 10/04/24 1900 10/05/24 0459  BP: (!) 140/52 129/61 136/65 (!) 146/70  Pulse: 76 72 83 71  Resp: 20 18 18 18   Temp: 98 F (36.7 C) 97.9 F (36.6 C) 97.9 F (36.6 C) 97.6 F (36.4 C)  TempSrc: Oral Oral Oral Oral  SpO2: 100% 99% 98% 97%  Weight:      Height:        Intake/Output Summary (Last 24 hours) at 10/05/2024 0801 Last data filed at 10/04/2024 1526 Gross per 24 hour  Intake 247.84 ml  Output --  Net 247.84 ml   Filed Weights   10/04/24 0741  Weight: 79.4 kg    Examination:  General:  Pleasantly resting in bed, No acute distress. HEENT:  Normocephalic atraumatic.  Sclerae nonicteric, noninjected.  Extraocular movements intact bilaterally. Neck:  Without mass or deformity.  Trachea is midline. Lungs:  Clear to auscultate bilaterally without rhonchi, wheeze, or rales. Heart:  Regular rate and rhythm.  Without  murmurs, rubs, or gallops. Abdomen:  Soft, nontender, nondistended.  Without guarding or rebound. Extremities: Without cyanosis, clubbing, edema, or obvious deformity. Skin:  Warm and dry, no erythema.   Data Reviewed: I have personally reviewed following labs and imaging studies  CBC: Recent Labs  Lab 10/03/24 2052 10/04/24 0430 10/05/24 0444  WBC 7.6 7.8 5.6  NEUTROABS 5.7  --   --   HGB 12.9 11.6*  11.4*  HCT 41.3 36.5 37.6  MCV 92.8 93.4 94.2  PLT 434* 348 333   Basic Metabolic Panel: Recent Labs  Lab 10/03/24 2052 10/04/24 0430 10/05/24 0444  NA 137 141 139  K 3.7 3.7 3.6  CL 101 108 105  CO2 23 24 25   GLUCOSE 138* 127* 91  BUN 12 10 9   CREATININE 0.70 0.54 0.68  CALCIUM 9.5 8.8* 9.4   GFR: Estimated Creatinine Clearance: 59.3 mL/min (by C-G formula based on SCr of 0.68 mg/dL). Liver Function Tests: Recent Labs  Lab 10/04/24 0430  AST 13*  ALT 8  ALKPHOS 172*  BILITOT 0.7  PROT 5.7*  ALBUMIN 3.3*   CBG: Recent Labs  Lab 10/04/24 0854 10/04/24 1208 10/04/24 1705 10/04/24 2102 10/05/24 0745  GLUCAP 95 88 100* 112* 101*   No results found for this or any previous visit (from the past 240 hours).   Radiology Studies: CT PELVIS WO CONTRAST Addendum Date: 10/03/2024  ADDENDUM #1  ADDENDUM: In retrospect, there are bilateral periarticular longitudinal nondisplaced fractures of the anterior sacrum new from 04/27/2024 but of indeterminate age. no pelvic hematoma is associated with this. These are favored to be sacral insufficiency fractures. ---------------------------------------------------- Electronically signed by: Francis Quam MD 10/03/2024 11:03 PM EDT RP Workstation: [id number]   Result Date: 10/03/2024  ORIGINAL REPORT  EXAM: CT Pelvis, Without IV Contrast 10/03/2024 10:07:00 PM TECHNIQUE: Axial images were acquired through the pelvis without IV contrast. Reformatted images were reviewed. Automated exposure control, iterative reconstruction, and/or weight based adjustment of the mA/kV was utilized to reduce the radiation dose to as low as reasonably achievable. COMPARISON: CT abdomen and pelvis with contrast 04/27/2024. CLINICAL HISTORY: Pelvic pain, chronic, post-menopausal. Per chart: Pt c/o sacral and hip pain worsening over the last few weeks. Pt has hx of sciatica and states pain is similar. No injuries reported per pt. FINDINGS: BONES: There is  osteopenia. No acute fracture or focal pathologic lesion is seen in the visualized lower lumbar spine, sacrum, bony pelvis, and visualized proximal femurs. At L4-L5, again noted is advanced degenerative disc disease, anterior bridging osteophytes, and posterior osteophytes lateralizing to the right with chronic mass effect on the right greater than left descending L5 nerve roots and mild acquired spinal stenosis. There is left greater than right foraminal stenosis at this level. Advanced left and moderate right facet hypertrophy also. This was seen previously. JOINTS: No dislocation. There is spray artifact related to bilateral hip replacements, interval left hip replacement since 04/27/2024. There is chronic degenerative sclerosis overlying the superior left acetabular borders. SOFT TISSUES: There is a small umbilical fat hernia, and small midline and right paramedian infraumbilical fat hernias. There is no incarcerated hernia. No acute soft tissue abnormality is seen in the pelvis. INTRAPELVIC CONTENTS: The pelvic bowel is unremarkable aside from uncomplicated sigmoid diverticulosis and a rectosigmoid surgical colocolonic anastomosis. Limited images of the intrapelvic contents demonstrate no acute abnormality. IMPRESSION: 1. No acute osseous abnormality. 2. Advanced degenerative disc disease at L4-L5 with chronic mass effect on the right greater than left descending L5 nerve roots  and mild acquired spinal stenosis. Left greater than right foraminal stenosis at this level. Advanced left and moderate right facet hypertrophy. 3. Bilateral hip replacements with interval left hip replacement since 04/27/2024. 4. Osteopenia and degenerative change. 5. Anterior wall fat  hernias described above. Electronically signed by: Francis Quam MD 10/03/2024 10:34 PM EDT RP Workstation: [id number]   CT Lumbar Spine Wo Contrast Result Date: 10/03/2024 EXAM: CT OF THE LUMBAR SPINE WITHOUT CONTRAST 10/03/2024 10:07:00 PM  TECHNIQUE: CT of the lumbar spine was performed without the administration of intravenous contrast. Multiplanar reformatted images are provided for review. Automated exposure control, iterative reconstruction, and/or weight based adjustment of the mA/kV was utilized to reduce the radiation dose to as low as reasonably achievable. COMPARISON: CT of the abdomen and pelvis, and reconstructions 04/27/2024. CLINICAL HISTORY: Low back pain, increased fracture risk. Per chart: Pt c/o sacral and hip pain worsening over the last few weeks. Pt has hx of sciatica and states pain is similar. No injuries reported per pt. FINDINGS: BONES AND ALIGNMENT: Normal vertebral body heights. There is generalized osteopenia. No new alignment abnormality. Chronic degenerative mild grade 1 anterolisthesis is again noted from L3-L4 down and there is a mild dextroscoliosis apex at L2. The lumbar segmentation is standard. New from 04/27/2024 there are nondisplaced longitudinal periarticular fractures of the anterior sacrum bilaterally, best seen on series 4 axial images 103 through 116. Possible sacral insufficiency fractures. There is a mild to moderate chronic upper plate compression fracture of L2, unchanged. . . No new lumbar fracture and no focal pathologic bone lesion. Both SI joints are patent. DEGENERATIVE CHANGES: At T11-T12 and T12-L1, the discs are normal in height. There is no herniation, canal or foraminal stenosis. At L1-L2 there is mild disc space loss. There is a left paracentral to far lateral disc osteophyte complex partially effacing the left subarticular zone, suggested mild mass effect on the left L2 nerve root. There is mild facet spurring left greater than right without foraminal compromise. At L2-L3, the disc is normal in height. There is facet joint spurring and dorsal ligamentous hypertrophy and a mild generalized disc bulge but no herniation or canal stenosis. There is mild facet hypertrophy without foraminal  compromise. At L3-L4 the disc is normal in height. There is moderate facet hypertrophy and mild ligament thickening but no significant disc bulge and no herniation, canal or foraminal stenosis. At L4-L5 there is chronic disc collapse with interbody ankylosis over portions and vacuum phenomenon. There are bidirectional endplate osteophytes, posteriorly are eccentric to the right with effacement of the bilateral subarticular zones, more so on the right and mild to moderate spinal canal stenosis. There is advanced left and moderate right facet hypertrophy. There is moderate to severe left and mild right foraminal stenosis. At L5-S1, there is mild disc space loss. The facet joints are hypertrophic and ankylosed. There is mild unilateral right foraminal stenosis. No bulge, herniation or canal stenosis. 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 There is aortic atherosclerosis. No acute paravertebral soft tissue findings. The patient is status post cholecystectomy with no biliary dilatation. IMPRESSION: 1. Nondisplaced longitudinal periarticular fractures of the anterior sacrum bilaterally, new from 04/27/2024, favored sacral insufficiency fractures. 2. Chronic disc collapse at L4-5 with interbody ankylosis over portions and bidirectional endplate osteophytes, resulting in acquired spinal canal and subarticular zone stenosis and moderate to severe left and mild right foraminal stenosis. 3. Mild disc space loss at L1-2  with a left paracentral to far lateral disc osteophyte complex partially effacing the left subarticular zone, with suggested mild mass effect on the left L2 nerve root. 4. Mild to moderate chronic superior endplate compression fracture of L2, unchanged. 5. Chronic degenerative mild grade 1 anterolisthesis from L3-4 down and mild dextroscoliosis with apex at L2. 6. Mild disc space loss at L5-S1 with hypertrophic and ankylosed facet joints,  resulting in mild unilateral right foraminal stenosis. Electronically signed by: Francis Quam MD 10/03/2024 11:01 PM EDT RP Workstation: [id number]      Scheduled Meds:  acetaminophen   1,000 mg Oral TID   azelastine   1 spray Each Nare BID   dapagliflozin  propanediol  10 mg Oral Daily   docusate sodium   100 mg Oral BID   enoxaparin (LOVENOX) injection  40 mg Subcutaneous Q24H   fluticasone   2 spray Each Nare Daily   insulin  aspart  0-5 Units Subcutaneous QHS   insulin  aspart  0-6 Units Subcutaneous TID WC   lidocaine   1 patch Transdermal Q24H   loratadine  10 mg Oral Daily   methocarbamol  (ROBAXIN ) injection  500 mg Intravenous Q6H   pantoprazole   40 mg Oral Daily   Continuous Infusions:  promethazine  (PHENERGAN ) injection (IM or IVPB) Stopped (10/04/24 9385)     LOS: 2 days   Time spent:  Elsie JAYSON Montclair, DO [facility]  If 7PM-7AM, please contact night-coverage [email or web address]  10/05/2024, 8:01 AM

## 2024-10-05 NOTE — Consult Note (Cosign Needed Addendum)
 Subjective:  HPI: Tracey Morgan, 78 y.o. female, with PMH of left total hip arthroplasty earlier this year presented to the emergency department with severe low back pain. She was seen for the same issue in our outpatient office approximately 2 weeks ago. The pain was primarily located in the right buttock and described as a stabbing pain. The intensity of pain was much less at the time. Radiographs in our office found no acute issues, she was prescribed oral prednisone . Her pain worsened over the next week without further injury. The prednisone  nor dilaudid  (left over from THA) improved her pain. Due to severity she presented to ED and was found to have sacral insufficiency fractures. Orthopedics consulted.   Patient Active Problem List   Diagnosis Date Noted   Sacral fracture (HCC) 10/03/2024   Morbid obesity (HCC) 10/03/2024   Primary osteoarthritis of left hip 07/27/2024   OA (osteoarthritis) of hip 03/27/2021   Primary osteoarthritis of right hip 03/27/2021   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   Hypertension 12/20/2019   Type 2 diabetes mellitus (HCC) 11/26/2018   Esophageal dysphagia 04/25/2016   Gastro-esophageal reflux disease without esophagitis 04/25/2016    Past Medical History:  Diagnosis Date   Arthritis    back, hips   DDD (degenerative disc disease), lumbar    Diverticulitis    under control   Eczema    Family history of adverse reaction to anesthesia    sister and daughter very slow to wake up   GERD (gastroesophageal reflux disease)    Heart murmur    History of kidney stones    Neuromuscular disorder (HCC)    Rt leg    Pneumonia    as child   PONV (postoperative nausea and vomiting)    Pre-diabetes    Sciatica    Scoliosis     Past Surgical History:  Procedure Laterality Date   ADENOIDECTOMY     APPENDECTOMY  1979   BACK SURGERY  1981   CATARACT EXTRACTION Left    CESAREAN SECTION  1981   CHOLECYSTECTOMY  1972   COLON SURGERY   2021   sigmoidectomy   GALLBLADDER SURGERY  1979   HIP SURGERY Right 03/2021   TONSILLECTOMY     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   Procedure: TOTAL HIP ARTHROPLASTY ANTERIOR APPROACH;  Surgeon: Melodi Lerner, MD;  Location: [facility];  Service: Orthopedics;  Laterality: Right;    TOTAL HIP ARTHROPLASTY Left 07/27/2024   Procedure: ARTHROPLASTY, HIP, TOTAL, ANTERIOR APPROACH;  Surgeon: Melodi Lerner, MD;  Location: [facility];  Service: Orthopedics;  Laterality: Left;    Prior to Admission medications   Medication Sig Start Date End Date Taking? Authorizing Provider  acetaminophen  (TYLENOL ) 500 MG tablet Take 500 mg by mouth every 6 (six) hours as needed for headache or moderate pain (pain score 4-6).   Yes [provider]  azelastine  (ASTELIN ) 0.1 % nasal spray USE 1 DOSE IN EACH NOSTRIL TWICE DAILY AS DIRECTED Patient taking differently: Place 1 spray into both nostrils in the morning. 09/17/22  Yes Cheryl Reusing, FNP  cetirizine  (ZYRTEC ) 10 MG tablet Take 1 tablet by mouth once daily 11/03/22  Yes Cheryl Reusing, FNP  dapagliflozin  propanediol (FARXIGA ) 5 MG TABS tablet Take 10 mg by mouth daily.   Yes [provider]  diphenhydrAMINE  (BENADRYL ) 25 MG tablet Take 25 mg by mouth every 6 (six) hours as needed for allergies or itching.   Yes [provider]  famotidine  (PEPCID ) 40 MG tablet Take 40 mg by mouth at bedtime as needed for heartburn or indigestion.   Yes [provider]  fluticasone  (FLONASE ) 50 MCG/ACT nasal spray Place 2 sprays into both nostrils every evening.   Yes [provider]  gabapentin  (NEURONTIN ) 100 MG capsule Take 200 mg by mouth every evening.   Yes [provider]  HYDROmorphone  (DILAUDID ) 2 MG tablet Take 1-2 tablets (2-4 mg total) by mouth every 6 (six) hours as needed for moderate pain (pain score 4-6) or severe pain (pain score 7-10). 07/28/24  Yes Shanan Fitzpatrick L, PA  magnesium  30 MG tablet  Take 30 mg by mouth every evening.   Yes [provider]  methocarbamol  (ROBAXIN ) 500 MG tablet Take 1 tablet (500 mg total) by mouth every 6 (six) hours as needed for muscle spasms. 07/28/24  Yes Shawndrea Rutkowski L, PA  Olopatadine HCl 0.2 % SOLN Place 1 drop into both eyes daily as needed. 07/20/24  Yes [provider]  ondansetron  (ZOFRAN ) 4 MG tablet Take 1 tablet (4 mg total) by mouth every 6 (six) hours as needed for nausea. 07/28/24  Yes Anaiz Qazi L, PA  pantoprazole  (PROTONIX ) 40 MG tablet Take 40 mg by mouth daily.   Yes [provider]  polyethylene glycol (MIRALAX  / GLYCOLAX ) 17 g packet Take 17 g by mouth daily as needed for mild constipation. 07/28/24  Yes Ogden Handlin L, PA  POTASSIUM PO Take 99 mg by mouth every evening.   Yes [provider]  predniSONE  (STERAPRED UNI-PAK 21 TAB) 5 MG (21) TBPK tablet 4 tablets on 8/23, 3 tablets on 8/24, 2 tablets on 8/25, 1 tablet on 8/26 07/30/24  Yes Rommel Hogston L, PA  torsemide  (DEMADEX ) 20 MG tablet Take 20 mg by mouth daily.   Yes [provider]  triamcinolone lotion (KENALOG) 0.1 % Apply 1 Application topically 2 (two) times daily as needed.   Yes [provider]  Vitamin D-Vitamin K (VITAMIN K2-VITAMIN D3 PO) Take 1 tablet by mouth every evening.   Yes [provider]  docusate sodium  (COLACE) 100 MG capsule Take 1 capsule (100 mg total) by mouth 2 (two) times daily. Patient not taking: Reported on 10/04/2024 07/28/24   Reena Flank L, PA  EPINEPHrine  0.3 mg/0.3 mL IJ SOAJ injection Inject 0.3 mg into the muscle as needed for anaphylaxis. Patient not taking: Reported on 10/04/2024 12/05/21   Cheryl Reusing, FNP    Allergies  Allergen Reactions   Hydralazine Anaphylaxis and Nausea Only    Patient had nausea and headache with po hydralazine 10mg     Metformin Other (See Comments)    Severe colitis.   Metronidazole  Other (See Comments)    Not specified    Percocet [Oxycodone-Acetaminophen ]     Pt is unsure    Buprenorphine Hcl Nausea And Vomiting, Rash and Other (See Comments)    hallucinations    Codeine Nausea And Vomiting, Rash and Other (See Comments)    Hallucinations   Hydrocodone  Nausea And Vomiting and Rash   Morphine And Codeine Nausea And Vomiting, Rash and Other (See Comments)    hallucinations    Social History   Socioeconomic History   Marital status: Divorced    Spouse name: Not on file   Number of children: Not on file   Years of education: Not on file   Highest education level: Not on file  Occupational History   Not on file  Tobacco Use   Smoking status:  Never   Smokeless tobacco: Never  Vaping Use   Vaping status: Never Used  Substance and Sexual Activity   Alcohol  use: No   Drug use: No   Sexual activity: Never  Other Topics Concern   Not on file  Social History Narrative   Not on file   Social Drivers of Health   Financial Resource Strain: Not on file  Food Insecurity: No Food Insecurity (10/04/2024)   Hunger Vital Sign    Worried About Running Out of Food in the Last Year: Never true    Ran Out of Food in the Last Year: Never true  Transportation Needs: No Transportation Needs (10/04/2024)   PRAPARE - Administrator, Civil Service (Medical): No    Lack of Transportation (Non-Medical): No  Physical Activity: Not on file  Stress: Not on file  Social Connections: Moderately Isolated (10/04/2024)   Social Connection and Isolation Panel    Frequency of Communication with Friends and Family: More than three times a week    Frequency of Social Gatherings with Friends and Family: More than three times a week    Attends Religious Services: More than 4 times per year    Active Member of [employer] or Organizations: No    Attends Banker Meetings: Never    Marital Status: Divorced  Catering Manager Violence: Not At Risk (10/04/2024)   Humiliation, Afraid, Rape, and Kick  questionnaire    Fear of Current or Ex-Partner: No    Emotionally Abused: No    Physically Abused: No    Sexually Abused: No    Tobacco Use: Low Risk  (09/25/2024)   Patient History    Smoking Tobacco Use: Never    Smokeless Tobacco Use: Never    Passive Exposure: Not on file   Social History   Substance and Sexual Activity  Alcohol  Use No    Family History  Problem Relation Age of Onset   Angioedema Father    Eczema Sister    Immunodeficiency Sister    Food Allergy  Niece     Review of Systems  Constitutional:  Negative for chills and fever.  HENT:  Negative for congestion, sore throat and tinnitus.   Eyes:  Negative for double vision, photophobia and pain.  Respiratory:  Negative for cough, shortness of breath and wheezing.   Cardiovascular:  Negative for chest pain, palpitations and orthopnea.  Gastrointestinal:  Negative for heartburn, nausea and vomiting.  Genitourinary:  Negative for dysuria, frequency and urgency.  Musculoskeletal:  Positive for back pain and joint pain.  Neurological:  Negative for dizziness, weakness and headaches.    Objective:  Physical Exam: Alert and oriented, no acute distress  Lower extremity exam: Bilateral calves soft and nontender Dorsal flexion/plantar flexion intact bilaterally No swelling noted in either LE Able to wiggle toes Distal pulses 2+ bilaterally   Vital signs in last 24 hours: Temp:  [97.6 F (36.4 C)-97.9 F (36.6 C)] 97.6 F (36.4 C) (10/29 0459) Pulse Rate:  [71-83] 71 (10/29 0459) Resp:  [18] 18 (10/29 0459) BP: (129-146)/(61-70) 146/70 (10/29 0459) SpO2:  [97 %-99 %] 97 % (10/29 0459)  Imaging Review L-spine/pelvis CT shows bilaterally nondisplaced fracture of the sacrum.  Assessment/Plan:  Sacral insufficiency fracture  This is a nonoperative fracture, which was discussed with the patient today. Continue pain management and progress activity as tolerated. Pain seems well managed currently at  rest.   Roxie Mess, PA-C Orthopedic Surgery [facility]

## 2024-10-06 ENCOUNTER — Ambulatory Visit: Admitting: Rehabilitation

## 2024-10-06 DIAGNOSIS — I159 Secondary hypertension, unspecified: Secondary | ICD-10-CM | POA: Diagnosis not present

## 2024-10-06 DIAGNOSIS — K219 Gastro-esophageal reflux disease without esophagitis: Secondary | ICD-10-CM | POA: Diagnosis not present

## 2024-10-06 DIAGNOSIS — S3219XA Other fracture of sacrum, initial encounter for closed fracture: Secondary | ICD-10-CM | POA: Diagnosis not present

## 2024-10-06 LAB — GLUCOSE, CAPILLARY: Glucose-Capillary: 72 mg/dL (ref 70–99)

## 2024-10-06 MED ORDER — OLOPATADINE HCL 0.1 % OP SOLN
1.0000 [drp] | Freq: Two times a day (BID) | OPHTHALMIC | Status: DC
Start: 2024-10-06 — End: 2024-10-11
  Administered 2024-10-06 – 2024-10-10 (×6): 1 [drp] via OPHTHALMIC
  Filled 2024-10-06: qty 5

## 2024-10-06 MED ORDER — TORSEMIDE 20 MG PO TABS
20.0000 mg | ORAL_TABLET | Freq: Every day | ORAL | Status: DC
  Filled 2024-10-06 (×6): qty 1

## 2024-10-06 MED ORDER — GABAPENTIN 100 MG PO CAPS
200.0000 mg | ORAL_CAPSULE | Freq: Every evening | ORAL | Status: DC
  Administered 2024-10-06 – 2024-10-10 (×5): 200 mg via ORAL
  Filled 2024-10-06 (×5): qty 2

## 2024-10-06 NOTE — Plan of Care (Signed)

## 2024-10-06 NOTE — Plan of Care (Signed)
  Problem: Education: Goal: Ability to describe self-care measures that may prevent or decrease complications (Diabetes Survival Skills Education) will improve Outcome: Progressing   Problem: Coping: Goal: Ability to adjust to condition or change in health will improve Outcome: Progressing

## 2024-10-06 NOTE — TOC Initial Note (Signed)
 Transition of Care [facility]) - Initial/Assessment Note    Patient Details  Name: Tracey Morgan MRN: [id number] Date of Birth: 1946/03/29  Transition of Care [facility]) CM/SW Contact:    Lanaiya Lantry, Glenys DASEN, RN  Clinical Narrative:                 CM spoke with patient at the bedside to discuss PT recommendation for SNF. Patient is agreeable to sending out referrals and requested [name]. CM explained the selection process. CM sent referrals for SNF out via HUB. Waiting on bed offers.   Expected Discharge Plan: Skilled Nursing Facility Barriers to Discharge: Continued Medical Work up, SNF Pending bed offer   Patient Goals and CMS Choice Patient states their goals for this hospitalization and ongoing recovery are:: [facility] CMS Medicare.gov Compare Post Acute Care list provided to:: Patient Choice offered to / list presented to : Patient [place] ownership interest in [facility]provided to:: Parent NA    Expected Discharge Plan and Services In-house Referral: NA Discharge Planning Services: CM Consult   Living arrangements for the past 2 months: Single Family Home                 DME Arranged: N/A DME Agency: NA       HH Arranged: NA HH Agency: NA        Prior Living Arrangements/Services Living arrangements for the past 2 months: Single Family Home Lives with:: Self Patient language and need for interpreter reviewed:: Yes Do you feel safe going back to the place where you live?: Yes      Need for Family Participation in Patient Care: Yes (Comment) Care giver support system in place?: Yes (comment) Current home services: DME (cane , walker) Criminal Activity/Legal Involvement Pertinent to Current Situation/Hospitalization: No - Comment as needed  Activities of Daily Living   ADL Screening (condition at time of admission) Independently performs ADLs?: Yes (appropriate for developmental age) Is the patient deaf or have difficulty hearing?: No Does the  patient have difficulty seeing, even when wearing glasses/contacts?: No Does the patient have difficulty concentrating, remembering, or making decisions?: No  Permission Sought/Granted Permission sought to share information with : Case Manager Permission granted to share information with : Yes, Verbal Permission Granted  Share Information with NAME: Ceclia Domino (Daughter)  [phone]  Permission granted to share info w AGENCY: SNF        Emotional Assessment Appearance:: Appears stated age Attitude/Demeanor/Rapport: Engaged Affect (typically observed): Appropriate Orientation: : Oriented to Self, Oriented to Place, Oriented to  Time, Oriented to Situation Alcohol  / Substance Use: Not Applicable Psych Involvement: No (comment)  Admission diagnosis:  Sacral fracture (HCC) [S32.10XA] Closed fracture of sacrum, unspecified portion of sacrum, initial encounter (HCC) [S32.10XA] Patient Active Problem List   Diagnosis Date Noted   Sacral fracture (HCC) 10/03/2024   Morbid obesity (HCC) 10/03/2024   Primary osteoarthritis of left hip 07/27/2024   OA (osteoarthritis) of hip 03/27/2021   Primary osteoarthritis of right hip 03/27/2021   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   Hypertension 12/20/2019   Type 2 diabetes mellitus (HCC) 11/26/2018   Esophageal dysphagia 04/25/2016   Gastro-esophageal reflux disease without esophagitis 04/25/2016   PCP:  Beverley Norleen NOVAK, MD Pharmacy:   [facility] [address] [place] - [address] [place] [address] Phone: [phone] Fax: [phone]     Social Drivers of Health (SDOH) Social History: SDOH Screenings   Food Insecurity:  No Food Insecurity (10/04/2024)  Housing: Low Risk  (10/04/2024)  Transportation Needs: No Transportation Needs (10/04/2024)  Utilities: Not At Risk (10/04/2024)  Social Connections: Moderately Isolated (10/04/2024)  Tobacco Use: Low Risk  (09/25/2024)    SDOH Interventions: Food Insecurity Interventions: Intervention Not Indicated Housing Interventions: Intervention Not Indicated Transportation Interventions: Intervention Not Indicated Utilities Interventions: Intervention Not Indicated Social Connections Interventions: Intervention Not Indicated   Readmission Risk Interventions    10/06/2024    5:20 PM  Readmission Risk Prevention Plan  Post Dischage Appt Complete  Medication Screening Complete  Transportation Screening Complete

## 2024-10-06 NOTE — Progress Notes (Signed)
 PT Cancellation Note  Patient Details Name: Tracey Morgan MRN: [id number] DOB: 09-Apr-1946   Cancelled Treatment:    Reason Eval/Treat Not Completed: Other (comment) PT attempted x 2, patient  just up to [facility] both times. Patient reports icing to buttocks to  numb to tolerate sitting. Continue PT.Scorpio Fortin [facility] PT Acute Rehabilitation Services Office [phone]   Leigh Darice Norris 10/06/2024, 2:55 PM

## 2024-10-06 NOTE — Progress Notes (Signed)
 PROGRESS NOTE    Tracey Morgan  FMW:[id number] DOB: Feb 10, 1946 DOA: 10/03/2024 PCP: Beverley Norleen NOVAK, MD   Brief Narrative:  Patient is a 78 year old female with degenerative disc disease of chronic cervical, GERD, neuromuscular disorder, borderline diabetes, diverticular disease, HTN, history of scoliosis presented to ED with severe low back pain.  Patient Dors that she has been following with orthopedic surgery outpatient, was seen last week and started on prednisone  tapering dose and oral Dilaudid  however pain continued to worsen.  Radiating down to bilateral knees, having difficulty ambulating.  Hospitalist called for further evaluation.  Assessment & Plan:   Principal Problem:   Sacral fracture (HCC) Active Problems:   OA (osteoarthritis) of hip   Primary osteoarthritis of right hip   Gastro-esophageal reflux disease without esophagitis   Hypertension   Morbid obesity (HCC)   Type 2 diabetes mellitus (HCC)   Acute versus subacute sacral fracture (HCC) Ambulatory dysfunction, acute on chronic - No reported fall or trauma, orthopedics consulted and following, appreciate insight recommendations -Orthopedics following, unfortunately no surgical intervention/options at this time - PT OT continue to work with patient to improve ambulatory dysfunction - On current regimen including p.o. and IV Dilaudid  she appears her somewhat well-controlled today; continue Robaxin  and bowel regimen.   Primary osteoarthritis of right hip -History of bilateral hip replacements, no acute issues GERD - Continue PPI Pre-diabetes -Hemoglobin A1c 6.4 - Continue SSI, sensitive.  Continue Farxiga  HTN-hold home torsemide , as needed labetalol  as needed  Obesity class I Body mass index is 32.01 kg/m.  DVT prophylaxis: enoxaparin (LOVENOX) injection 40 mg Start: 10/04/24 1000 Code Status:   Code Status: Full Code Family Communication: At bedside  Status is: Inpatient  Dispo: The patient is from:  Home              Anticipated d/c is to: To be determined              Anticipated d/c date is: To be determined              Patient currently not medically stable for discharge  Consultants:  Orthopedic surgery  Procedures:  None  Antimicrobials:  None indicated  Subjective: No acute issues or events overnight, continues to report pain but moderately improving.  Able to ambulate to bedside commode for bowel movement earlier in the day.  Objective: Vitals:   10/05/24 0459 10/05/24 1301 10/05/24 2000 10/06/24 0501  BP: (!) 146/70 (!) 145/69 (!) 143/65 (!) 131/57  Pulse: 71 68 68 66  Resp: 18 19 12 20   Temp: 97.6 F (36.4 C) (!) 97.5 F (36.4 C) 98.5 F (36.9 C) 97.7 F (36.5 C)  TempSrc: Oral  Oral   SpO2: 97% 96% 100% 99%  Weight:      Height:        Intake/Output Summary (Last 24 hours) at 10/06/2024 0819 Last data filed at 10/05/2024 1821 Gross per 24 hour  Intake 480 ml  Output 300 ml  Net 180 ml   Filed Weights   10/04/24 0741  Weight: 79.4 kg    Examination:  General:  Pleasantly resting in bed, No acute distress. HEENT:  Normocephalic atraumatic.  Sclerae nonicteric, noninjected.  Extraocular movements intact bilaterally. Neck:  Without mass or deformity.  Trachea is midline. Lungs:  Clear to auscultate bilaterally without rhonchi, wheeze, or rales. Heart:  Regular rate and rhythm.  Without murmurs, rubs, or gallops. Abdomen:  Soft, nontender, nondistended.  Without guarding or rebound. Extremities: Without cyanosis, clubbing,  edema, or obvious deformity. Skin:  Warm and dry, no erythema.   Data Reviewed: I have personally reviewed following labs and imaging studies  CBC: Recent Labs  Lab 10/03/24 2052 10/04/24 0430 10/05/24 0444  WBC 7.6 7.8 5.6  NEUTROABS 5.7  --   --   HGB 12.9 11.6* 11.4*  HCT 41.3 36.5 37.6  MCV 92.8 93.4 94.2  PLT 434* 348 333   Basic Metabolic Panel: Recent Labs  Lab 10/03/24 2052 10/04/24 0430 10/05/24 0444   NA 137 141 139  K 3.7 3.7 3.6  CL 101 108 105  CO2 23 24 25   GLUCOSE 138* 127* 91  BUN 12 10 9   CREATININE 0.70 0.54 0.68  CALCIUM 9.5 8.8* 9.4   GFR: Estimated Creatinine Clearance: 59.3 mL/min (by C-G formula based on SCr of 0.68 mg/dL). Liver Function Tests: Recent Labs  Lab 10/04/24 0430  AST 13*  ALT 8  ALKPHOS 172*  BILITOT 0.7  PROT 5.7*  ALBUMIN 3.3*   CBG: Recent Labs  Lab 10/05/24 0745 10/05/24 1151 10/05/24 1629 10/05/24 2204 10/06/24 0742  GLUCAP 101* 108* 86 93 72   No results found for this or any previous visit (from the past 240 hours).   Scheduled Meds:  acetaminophen   1,000 mg Oral TID   azelastine   1 spray Each Nare BID   dapagliflozin  propanediol  10 mg Oral Daily   docusate sodium   100 mg Oral BID   enoxaparin (LOVENOX) injection  40 mg Subcutaneous Q24H   fluticasone   2 spray Each Nare QHS   insulin  aspart  0-5 Units Subcutaneous QHS   insulin  aspart  0-6 Units Subcutaneous TID WC   lidocaine   1 patch Transdermal Q24H   loratadine  10 mg Oral Daily   methocarbamol  (ROBAXIN ) injection  500 mg Intravenous Q6H   pantoprazole   40 mg Oral Daily   Continuous Infusions:  promethazine  (PHENERGAN ) injection (IM or IVPB) Stopped (10/04/24 9385)     LOS: 3 days   Time spent:  Elsie JAYSON Montclair, DO [facility]  If 7PM-7AM, please contact night-coverage [email or web address]  10/06/2024, 8:19 AM

## 2024-10-07 ENCOUNTER — Encounter (HOSPITAL_COMMUNITY): Payer: Self-pay | Admitting: Internal Medicine

## 2024-10-07 DIAGNOSIS — S3219XA Other fracture of sacrum, initial encounter for closed fracture: Secondary | ICD-10-CM | POA: Diagnosis not present

## 2024-10-07 DIAGNOSIS — I159 Secondary hypertension, unspecified: Secondary | ICD-10-CM | POA: Diagnosis not present

## 2024-10-07 DIAGNOSIS — K219 Gastro-esophageal reflux disease without esophagitis: Secondary | ICD-10-CM | POA: Diagnosis not present

## 2024-10-07 LAB — URINALYSIS, ROUTINE W REFLEX MICROSCOPIC
Bacteria, UA: NONE SEEN
Bilirubin Urine: NEGATIVE
Glucose, UA: >=500 mg/dL — AB
Hgb urine dipstick: NEGATIVE
Ketones, ur: 5 mg/dL — AB
Nitrite: NEGATIVE
Protein, ur: NEGATIVE mg/dL
Specific Gravity, Urine: 1.024 (ref 1.005–1.030)
pH: 5 (ref 5.0–8.0)

## 2024-10-07 MED ORDER — MECLIZINE HCL 25 MG PO TABS
25.0000 mg | ORAL_TABLET | Freq: Three times a day (TID) | ORAL | Status: DC | PRN
  Administered 2024-10-09 – 2024-10-10 (×3): 25 mg via ORAL
  Filled 2024-10-07 (×3): qty 1

## 2024-10-07 MED ORDER — ACETAMINOPHEN 325 MG PO TABS
325.0000 mg | ORAL_TABLET | ORAL | Status: DC | PRN
  Filled 2024-10-07: qty 1

## 2024-10-07 MED ORDER — TRAMADOL HCL 50 MG PO TABS
50.0000 mg | ORAL_TABLET | Freq: Once | ORAL | Status: DC
Start: 2024-10-07 — End: 2024-10-11
  Filled 2024-10-07: qty 1

## 2024-10-07 MED ORDER — HYDROMORPHONE HCL 2 MG PO TABS
2.0000 mg | ORAL_TABLET | Freq: Four times a day (QID) | ORAL | Status: DC | PRN
  Administered 2024-10-07 – 2024-10-08 (×4): 4 mg via ORAL
  Filled 2024-10-07 (×2): qty 2
  Filled 2024-10-07: qty 1
  Filled 2024-10-07: qty 2
  Filled 2024-10-07: qty 1

## 2024-10-07 NOTE — Progress Notes (Signed)
 PROGRESS NOTE    Tracey Morgan  FMW:[id number] DOB: 01-20-1946 DOA: 10/03/2024 PCP: Beverley Norleen NOVAK, MD   Brief Narrative:  Patient is a 78 year old female with degenerative disc disease of chronic cervical, GERD, neuromuscular disorder, borderline diabetes, diverticular disease, HTN, history of scoliosis presented to ED with severe low back pain.  Patient Dors that she has been following with orthopedic surgery outpatient, was seen last week and started on prednisone  tapering dose and oral Dilaudid  however pain continued to worsen.  Radiating down to bilateral knees, having difficulty ambulating.  Hospitalist called for further evaluation.  Assessment & Plan:   Principal Problem:   Sacral fracture (HCC) Active Problems:   OA (osteoarthritis) of hip   Primary osteoarthritis of right hip   Gastro-esophageal reflux disease without esophagitis   Hypertension   Morbid obesity (HCC)   Type 2 diabetes mellitus (HCC)   Acute versus subacute sacral fracture (HCC) Ambulatory dysfunction, acute on chronic - No reported fall or trauma, orthopedics consulted and following, appreciate insight recommendations -Orthopedics following, unfortunately no surgical intervention/options at this time - PT OT continue to work with patient to improve ambulatory dysfunction - Somewhat controlled on PO dilaudid /acetaminophen  - will add x1 tramadol  dose today to see if she tolerates this better than dilaudid  (continues to complain of somnolence) - No IV dilaudid  used in >72h- will DC   Primary osteoarthritis of right hip -History of bilateral hip replacements, no acute issues GERD - Continue PPI Pre-diabetes -Hemoglobin A1c 6.4 - Continue SSI, sensitive.  Continue Farxiga  HTN-hold home torsemide , as needed labetalol  as needed  Obesity class I Body mass index is 32.01 kg/m.  DVT prophylaxis: enoxaparin (LOVENOX) injection 40 mg Start: 10/04/24 1000 Code Status:   Code Status: Full Code Family  Communication: At bedside  Status is: Inpatient  Dispo: The patient is from: Home              Anticipated d/c is to: To be determined              Anticipated d/c date is: To be determined              Patient currently not medically stable for discharge  Consultants:  Orthopedic surgery  Procedures:  None  Antimicrobials:  None indicated  Subjective: No acute issues or events overnight, continues to report pain but moderately improving.  Able to ambulate to bedside commode for bowel movement earlier in the day.  Objective: Vitals:   10/05/24 2000 10/06/24 0501 10/06/24 1925 10/07/24 0510  BP: (!) 143/65 (!) 131/57 134/65 (!) 151/72  Pulse: 68 66 80 68  Resp: 12 20 18 16   Temp: 98.5 F (36.9 C) 97.7 F (36.5 C) 97.6 F (36.4 C) (!) 97.5 F (36.4 C)  TempSrc: Oral  Oral   SpO2: 100% 99% 99% 93%  Weight:      Height:        Intake/Output Summary (Last 24 hours) at 10/07/2024 0758 Last data filed at 10/07/2024 0520 Gross per 24 hour  Intake --  Output 400 ml  Net -400 ml   Filed Weights   10/04/24 0741  Weight: 79.4 kg    Examination:  General:  Pleasantly resting in bed, No acute distress. HEENT:  Normocephalic atraumatic.  Sclerae nonicteric, noninjected.  Extraocular movements intact bilaterally. Neck:  Without mass or deformity.  Trachea is midline. Lungs:  Clear to auscultate bilaterally without rhonchi, wheeze, or rales. Heart:  Regular rate and rhythm.  Without murmurs, rubs, or gallops.  Abdomen:  Soft, nontender, nondistended.  Without guarding or rebound. Extremities: Without cyanosis, clubbing, edema, or obvious deformity. Skin:  Warm and dry, no erythema.   Data Reviewed: I have personally reviewed following labs and imaging studies  CBC: Recent Labs  Lab 10/03/24 2052 10/04/24 0430 10/05/24 0444  WBC 7.6 7.8 5.6  NEUTROABS 5.7  --   --   HGB 12.9 11.6* 11.4*  HCT 41.3 36.5 37.6  MCV 92.8 93.4 94.2  PLT 434* 348 333   Basic  Metabolic Panel: Recent Labs  Lab 10/03/24 2052 10/04/24 0430 10/05/24 0444  NA 137 141 139  K 3.7 3.7 3.6  CL 101 108 105  CO2 23 24 25   GLUCOSE 138* 127* 91  BUN 12 10 9   CREATININE 0.70 0.54 0.68  CALCIUM 9.5 8.8* 9.4   GFR: Estimated Creatinine Clearance: 59.3 mL/min (by C-G formula based on SCr of 0.68 mg/dL). Liver Function Tests: Recent Labs  Lab 10/04/24 0430  AST 13*  ALT 8  ALKPHOS 172*  BILITOT 0.7  PROT 5.7*  ALBUMIN 3.3*   CBG: Recent Labs  Lab 10/05/24 0745 10/05/24 1151 10/05/24 1629 10/05/24 2204 10/06/24 0742  GLUCAP 101* 108* 86 93 72   No results found for this or any previous visit (from the past 240 hours).   Scheduled Meds:  azelastine   1 spray Each Nare BID   dapagliflozin  propanediol  10 mg Oral Daily   docusate sodium   100 mg Oral BID   enoxaparin (LOVENOX) injection  40 mg Subcutaneous Q24H   fluticasone   2 spray Each Nare QHS   gabapentin   200 mg Oral QPM   lidocaine   1 patch Transdermal Q24H   loratadine  10 mg Oral Daily   methocarbamol  (ROBAXIN ) injection  500 mg Intravenous Q6H   olopatadine  1 drop Both Eyes BID   pantoprazole   40 mg Oral Daily   torsemide   20 mg Oral Daily   Continuous Infusions:  promethazine  (PHENERGAN ) injection (IM or IVPB) Stopped (10/04/24 9385)     LOS: 4 days   Time spent:  Elsie JAYSON Montclair, DO [facility]  If 7PM-7AM, please contact night-coverage [email or web address]  10/07/2024, 7:58 AM

## 2024-10-07 NOTE — Progress Notes (Addendum)
 Physical Therapy Treatment Patient Details Name: Tracey Morgan MRN: [id number] DOB: 06/03/1946 Today's Date: 10/07/2024   History of Present Illness 78 y.o. female admitted with low back pain and bilateral hip pain.  CT L-spine and CT pelvis which showed bilateral periarticular longitudinal nondisplaced fracture of the anterior sacrum bilaterally of indeterminate age.  No pelvic hematoma, favored to be sacral insufficiency fractures.  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  Mild to moderate chronic superior endplate compression fracture of L2.s/p L THA, anterior approach on 07/27/2024 due to failure of conservative measures,. Pt PMH: L THA 07/2024, neuromuscular disorder, GERD, OA, back surgery and R THA (2022).    PT Comments  The patient is making small gains in  mobility, patient able to mobilize self to siting, moving slowly. Patient reporting dizziness when rolled and then sat upright. Reports decreased dizzy symptoms.  Patient  stood x ~  3  minutes at RW and  able to scootch  feet to move toward [facility], not actually able to lift  a foot to step due to  pain. After return to supine, patient having difficulty getting comfortable. Patient  Using Ice to  buttocks with some relief. Patient will benefit from continued inpatient follow up therapy, <3 hours/day    If plan is discharge home, recommend the following: Two people to help with walking and/or transfers;A lot of help with bathing/dressing/bathroom;Assist for transportation;Help with stairs or ramp for entrance;Assistance with cooking/housework   Can travel by private vehicle     No  Equipment Recommendations  None recommended by PT    Recommendations for Other Services       Precautions / Restrictions Precautions Precautions: Fall Restrictions Weight Bearing Restrictions Per Provider Order: No     Mobility  Bed Mobility Overal bed mobility: Needs Assistance Bed Mobility: Rolling, Sidelying to Sit, Sit  to Sidelying Rolling: Contact guard assist Sidelying to sit: Contact guard assist     Sit to sidelying: Contact guard assist General bed mobility comments: patient slowly moves herself slowly, knows how to move and adjust with least pain. patient rolled, moved legs over  edge and the able to push self to upiright. Going back to sidelying, then required support  of legs onto bed. Able to roll to back    Transfers Overall transfer level: Needs assistance Equipment used: Rolling walker (2 wheels) Transfers: Sit to/from Stand Sit to Stand: Mod assist, +2 safety/equipment, From elevated surface           General transfer comment: bed raised, stood at RW, tolerated standing and scootching feet   to left along the bed edge.    Ambulation/Gait                   Stairs             Wheelchair Mobility     Tilt Bed    Modified Rankin (Stroke Patients Only)       Balance Overall balance assessment: Needs assistance Sitting-balance support: Feet supported, Bilateral upper extremity supported Sitting balance-Leahy Scale: Fair Sitting balance - Comments: reliant on the UE's   for trunk support,  when relaeses UE support, reports increased pain and requested to return to  sidelying.   Standing balance support: Bilateral upper extremity supported, During functional activity, Reliant on assistive device for balance Standing balance-Leahy Scale: Poor  Communication Communication Communication: No apparent difficulties  Cognition Arousal: Alert Behavior During Therapy: WFL for tasks assessed/performed, Anxious   PT - Cognitive impairments: No apparent impairments                         Following commands: Intact      Cueing    Exercises      General Comments        Pertinent Vitals/Pain Pain Assessment Pain Score: 8  Pain Location: intermittent periods of intense pain, depending  on how pt moves Pain  Descriptors / Indicators: Aching, Discomfort, Crying, Grimacing, Guarding, Cramping Pain Intervention(s): Monitored during session, Premedicated before session, Ice applied, Repositioned    Home Living                          Prior Function            PT Goals (current goals can now be found in the care plan section) Progress towards PT goals: Progressing toward goals    Frequency    Min 3X/week      PT Plan      Co-evaluation              AM-PAC PT 6 Clicks Mobility   Outcome Measure  Help needed turning from your back to your side while in a flat bed without using bedrails?: A Little Help needed moving from lying on your back to sitting on the side of a flat bed without using bedrails?: A Little Help needed moving to and from a bed to a chair (including a wheelchair)?: A Lot Help needed standing up from a chair using your arms (e.g., wheelchair or bedside chair)?: A Lot Help needed to walk in hospital room?: Total Help needed climbing 3-5 steps with a railing? : Total 6 Click Score: 12    End of Session Equipment Utilized During Treatment: Gait belt Activity Tolerance: Patient limited by pain Patient left: in bed;with call bell/phone within reach Nurse Communication: Mobility status PT Visit Diagnosis: Unsteadiness on feet (R26.81);Other abnormalities of gait and mobility (R26.89);Pain     Time: 1125-1150 PT Time Calculation (min) (ACUTE ONLY): 25 min  Charges:    $Therapeutic Activity: 23-37 mins PT General Charges $$ ACUTE PT VISIT: 1 Visit                     {[facility] PT Acute Rehabilitation Services Office [phone]    Leigh Darice Norris 10/07/2024, 2:18 PM

## 2024-10-07 NOTE — Plan of Care (Signed)

## 2024-10-07 NOTE — TOC Progression Note (Addendum)
 Transition of Care [facility]) - Progression Note    Patient Details  Name: Tracey Morgan MRN: [id number] Date of Birth: [birth year]  Transition of Care [facility]) CM/SW Contact  Doneta Glenys DASEN, RN Phone Number: 10/07/2024, 2:53 PM  Clinical Narrative:    Choice List given Rm 1533 Earnie Bertrand       Service Provider Request Status STAR(s) Address Phone Patient Preferred  [facility] Preferred SNF  Accepted  [address] [place] [address] [phone]   [facility] SNF  Accepted  [address], [place] [address] [phone]   [facility] SNF  Accepted  [address] [place] [address] [phone]   [facility] Preferred SNF  Accepted  [address] [place] [address] [phone]   [facility] SNF  Accepted  [address] [place] [place] [address] [phone]   [facility]  Accepted  [address] [place] [address] [phone]    3:39 PM Patient informed CM that she is not going to go to any SNF except [name]. Patient states she know someone that can get her in to [name] and since she is not being discharge until the end of next week she has time. CM called [name] and left message requesting to review referral.  Expected Discharge Plan: Skilled Nursing Facility Barriers to Discharge: Continued Medical Work up, SNF Pending bed offer               Expected Discharge Plan and Services In-house Referral: NA Discharge Planning Services: CM Consult   Living arrangements for the past 2 months: Single Family Home                 DME Arranged: N/A DME Agency: NA       HH Arranged: NA HH Agency: NA         Social Drivers of Health (SDOH) Interventions SDOH Screenings   Food Insecurity: No Food Insecurity (10/04/2024)  Housing: Low Risk  (10/04/2024)  Transportation Needs: No Transportation Needs (10/04/2024)  Utilities: Not At Risk (10/04/2024)   Social Connections: Moderately Isolated (10/04/2024)  Tobacco Use: Low Risk  (09/25/2024)    Readmission Risk Interventions    10/06/2024    5:20 PM  Readmission Risk Prevention Plan  Post Dischage Appt Complete  Medication Screening Complete  Transportation Screening Complete

## 2024-10-08 DIAGNOSIS — K219 Gastro-esophageal reflux disease without esophagitis: Secondary | ICD-10-CM | POA: Diagnosis not present

## 2024-10-08 DIAGNOSIS — I159 Secondary hypertension, unspecified: Secondary | ICD-10-CM | POA: Diagnosis not present

## 2024-10-08 DIAGNOSIS — S3219XA Other fracture of sacrum, initial encounter for closed fracture: Secondary | ICD-10-CM | POA: Diagnosis not present

## 2024-10-08 MED ORDER — HYDROMORPHONE HCL 2 MG PO TABS
2.0000 mg | ORAL_TABLET | ORAL | Status: DC | PRN
  Administered 2024-10-08 – 2024-10-10 (×10): 4 mg via ORAL
  Administered 2024-10-10: 2 mg via ORAL
  Administered 2024-10-11 (×2): 4 mg via ORAL
  Filled 2024-10-08 (×13): qty 2

## 2024-10-08 MED ORDER — SENNOSIDES-DOCUSATE SODIUM 8.6-50 MG PO TABS
1.0000 | ORAL_TABLET | Freq: Two times a day (BID) | ORAL | Status: DC
  Filled 2024-10-08 (×3): qty 1

## 2024-10-08 MED ORDER — POLYETHYLENE GLYCOL 3350 17 G PO PACK
17.0000 g | PACK | Freq: Two times a day (BID) | ORAL | Status: DC
  Administered 2024-10-08 – 2024-10-09 (×4): 17 g via ORAL
  Filled 2024-10-08 (×7): qty 1

## 2024-10-08 NOTE — Progress Notes (Addendum)
 PROGRESS NOTE    Tracey Morgan  FMW:[id number] DOB: 1946-06-12 DOA: 10/03/2024 PCP: Beverley Norleen NOVAK, MD   Brief Narrative:  Patient is a 78 year old female with degenerative disc disease of chronic cervical, GERD, neuromuscular disorder, borderline diabetes, diverticular disease, HTN, history of scoliosis presented to ED with severe low back pain.  Patient Dors that she has been following with orthopedic surgery outpatient, was seen last week and started on prednisone  tapering dose and oral Dilaudid  however pain continued to worsen.  Radiating down to bilateral knees, having difficulty ambulating.  Hospitalist called for further evaluation.  Assessment & Plan:   Principal Problem:   Sacral fracture (HCC) Active Problems:   OA (osteoarthritis) of hip   Primary osteoarthritis of right hip   Gastro-esophageal reflux disease without esophagitis   Hypertension   Morbid obesity (HCC)   Type 2 diabetes mellitus (HCC)   Acute versus subacute sacral fracture (HCC) Ambulatory dysfunction, acute on chronic - No reported fall or trauma, orthopedics consulted and following, appreciate insight recommendations -Orthopedics following, unfortunately no surgical intervention/options at this time - PT OT continue to work with patient to improve ambulatory dysfunction - Increase frequency of Dilaudid  today, patient did not require tramadol  previously   Primary osteoarthritis of right hip -History of bilateral hip replacements, no acute issues GERD - Continue PPI Pre-diabetes -Hemoglobin A1c 6.4 - Continue SSI, sensitive.  Continue Farxiga  HTN-hold home torsemide , as needed labetalol  as needed  Obesity class I Body mass index is 32.01 kg/m.  DVT prophylaxis: enoxaparin (LOVENOX) injection 40 mg Start: 10/04/24 1000 Code Status:   Code Status: Full Code Family Communication: At bedside  Status is: Inpatient  Dispo: The patient is from: Home              Anticipated d/c is to: To be  determined              Anticipated d/c date is: To be determined              Patient currently not medically stable for discharge  Consultants:  Orthopedic surgery  Procedures:  None  Antimicrobials:  None indicated  Subjective: No acute issues or events overnight, continues to report pain but moderately improving.  Able to ambulate to bedside commode for bowel movement earlier in the day.  Objective: Vitals:   10/07/24 0510 10/07/24 1347 10/07/24 2027 10/08/24 0510  BP: (!) 151/72 127/62 123/61 132/71  Pulse: 68 82 83 73  Resp: 16 16 17 18   Temp: (!) 97.5 F (36.4 C) 97.9 F (36.6 C) 98.1 F (36.7 C) 97.8 F (36.6 C)  TempSrc:    Oral  SpO2: 93%  98%   Weight:      Height:       No intake or output data in the 24 hours ending 10/08/24 0800  Filed Weights   10/04/24 0741  Weight: 79.4 kg    Examination:  General:  Pleasantly resting in bed, No acute distress. HEENT:  Normocephalic atraumatic.  Sclerae nonicteric, noninjected.  Extraocular movements intact bilaterally. Neck:  Without mass or deformity.  Trachea is midline. Lungs:  Clear to auscultate bilaterally without rhonchi, wheeze, or rales. Heart:  Regular rate and rhythm.  Without murmurs, rubs, or gallops. Abdomen:  Soft, nontender, nondistended.  Without guarding or rebound. Extremities: Without cyanosis, clubbing, edema, or obvious deformity. Skin:  Warm and dry, no erythema.   Data Reviewed: I have personally reviewed following labs and imaging studies  CBC: Recent Labs  Lab 10/03/24  2052 10/04/24 0430 10/05/24 0444  WBC 7.6 7.8 5.6  NEUTROABS 5.7  --   --   HGB 12.9 11.6* 11.4*  HCT 41.3 36.5 37.6  MCV 92.8 93.4 94.2  PLT 434* 348 333   Basic Metabolic Panel: Recent Labs  Lab 10/03/24 2052 10/04/24 0430 10/05/24 0444  NA 137 141 139  K 3.7 3.7 3.6  CL 101 108 105  CO2 23 24 25   GLUCOSE 138* 127* 91  BUN 12 10 9   CREATININE 0.70 0.54 0.68  CALCIUM 9.5 8.8* 9.4    GFR: Estimated Creatinine Clearance: 59.3 mL/min (by C-G formula based on SCr of 0.68 mg/dL). Liver Function Tests: Recent Labs  Lab 10/04/24 0430  AST 13*  ALT 8  ALKPHOS 172*  BILITOT 0.7  PROT 5.7*  ALBUMIN 3.3*   CBG: Recent Labs  Lab 10/05/24 0745 10/05/24 1151 10/05/24 1629 10/05/24 2204 10/06/24 0742  GLUCAP 101* 108* 86 93 72   No results found for this or any previous visit (from the past 240 hours).   Scheduled Meds:  azelastine   1 spray Each Nare BID   dapagliflozin  propanediol  10 mg Oral Daily   docusate sodium   100 mg Oral BID   enoxaparin (LOVENOX) injection  40 mg Subcutaneous Q24H   fluticasone   2 spray Each Nare QHS   gabapentin   200 mg Oral QPM   lidocaine   1 patch Transdermal Q24H   loratadine  10 mg Oral Daily   methocarbamol  (ROBAXIN ) injection  500 mg Intravenous Q6H   olopatadine  1 drop Both Eyes BID   pantoprazole   40 mg Oral Daily   torsemide   20 mg Oral Daily   traMADol   50 mg Oral Once   Continuous Infusions:  promethazine  (PHENERGAN ) injection (IM or IVPB) Stopped (10/04/24 9385)     LOS: 5 days   Time spent:  Elsie JAYSON Montclair, DO [facility]  If 7PM-7AM, please contact night-coverage [email or web address]  10/08/2024, 8:00 AM

## 2024-10-08 NOTE — Plan of Care (Addendum)
  Problem: Education: Goal: Ability to describe self-care measures that may prevent or decrease complications (Diabetes Survival Skills Education) will improve Outcome: Progressing   Problem: Coping: Goal: Ability to adjust to condition or change in health will improve Outcome: Progressing   Problem: Fluid Volume: Goal: Ability to maintain a balanced intake and output will improve Outcome: Progressing   Problem: Health Behavior/Discharge Planning: Goal: Ability to identify and utilize available resources and services will improve Outcome: Progressing   Problem: Pain Managment: Goal: General experience of comfort will improve and/or be controlled Outcome: Progressing   Problem: Safety: Goal: Ability to remain free from injury will improve Outcome: Progressing

## 2024-10-08 NOTE — Plan of Care (Signed)

## 2024-10-08 NOTE — Plan of Care (Signed)
  Problem: Education: Goal: Ability to describe self-care measures that may prevent or decrease complications (Diabetes Survival Skills Education) will improve 10/08/2024 0736 by Cathleen Christobal BROCKS, RN Outcome: Progressing 10/08/2024 0733 by Cathleen Christobal BROCKS, RN Outcome: Progressing   Problem: Coping: Goal: Ability to adjust to condition or change in health will improve Outcome: Progressing   Problem: Fluid Volume: Goal: Ability to maintain a balanced intake and output will improve 10/08/2024 0736 by Cathleen Christobal BROCKS, RN Outcome: Progressing 10/08/2024 0733 by Cathleen Christobal BROCKS, RN Outcome: Progressing   Problem: Health Behavior/Discharge Planning: Goal: Ability to identify and utilize available resources and services will improve Outcome: Progressing   Problem: Pain Managment: Goal: General experience of comfort will improve and/or be controlled Outcome: Progressing   Problem: Safety: Goal: Ability to remain free from injury will improve Outcome: Progressing

## 2024-10-09 DIAGNOSIS — K219 Gastro-esophageal reflux disease without esophagitis: Secondary | ICD-10-CM | POA: Diagnosis not present

## 2024-10-09 DIAGNOSIS — I159 Secondary hypertension, unspecified: Secondary | ICD-10-CM | POA: Diagnosis not present

## 2024-10-09 DIAGNOSIS — S3219XA Other fracture of sacrum, initial encounter for closed fracture: Secondary | ICD-10-CM | POA: Diagnosis not present

## 2024-10-09 MED ORDER — BISACODYL 10 MG RE SUPP
10.0000 mg | Freq: Every day | RECTAL | Status: DC | PRN
  Administered 2024-10-09 (×2): 10 mg via RECTAL
  Filled 2024-10-09 (×2): qty 1

## 2024-10-09 NOTE — Progress Notes (Signed)
 PROGRESS NOTE    Tracey Morgan  FMW:[id number] DOB: March 25, 1946 DOA: 10/03/2024 PCP: Beverley Norleen NOVAK, MD   Brief Narrative:  Patient is a 78 year old female with degenerative disc disease of chronic cervical, GERD, neuromuscular disorder, borderline diabetes, diverticular disease, HTN, history of scoliosis presented to ED with severe low back pain.  Patient Dors that she has been following with orthopedic surgery outpatient, was seen last week and started on prednisone  tapering dose and oral Dilaudid  however pain continued to worsen.  Radiating down to bilateral knees, having difficulty ambulating.  Hospitalist called for further evaluation.  Assessment & Plan:   Principal Problem:   Sacral fracture (HCC) Active Problems:   OA (osteoarthritis) of hip   Primary osteoarthritis of right hip   Gastro-esophageal reflux disease without esophagitis   Hypertension   Morbid obesity (HCC)   Type 2 diabetes mellitus (HCC)   Acute versus subacute sacral fracture (HCC) Ambulatory dysfunction, acute on chronic - No reported fall or trauma, orthopedics consulted and following, appreciate insight recommendations -Orthopedics following, unfortunately no surgical intervention/options at this time - PT OT continue to work with patient to improve ambulatory dysfunction - Increase frequency of Dilaudid  today, patient did not require tramadol  previously   Constipation, ongoing - Likely multifactorial given above - Increase ambulation/OOB as tolerated - Increase bowel regimen - add suppository/enema today - follow for BM  Primary osteoarthritis of right hip -History of bilateral hip replacements, no acute issues GERD - Continue PPI Pre-diabetes -Hemoglobin A1c 6.4 - Continue SSI, sensitive.  Continue Farxiga  HTN-hold home torsemide , as needed labetalol  as needed  Obesity class I Body mass index is 32.01 kg/m.  DVT prophylaxis: enoxaparin (LOVENOX) injection 40 mg Start: 10/04/24 1000 Code  Status:   Code Status: Full Code Family Communication: At bedside  Status is: Inpatient  Dispo: The patient is from: Home              Anticipated d/c is to: To be determined              Anticipated d/c date is: To be determined              Patient currently not medically stable for discharge  Consultants:  Orthopedic surgery  Procedures:  None  Antimicrobials:  None indicated  Subjective: No acute issues or events overnight, continues to report pain but moderately improving.  Able to ambulate to bedside commode for bowel movement earlier in the day.  Objective: Vitals:   10/08/24 0510 10/08/24 1346 10/08/24 2008 10/09/24 0515  BP: 132/71 135/69 (!) 122/56 129/64  Pulse: 73 70 85 82  Resp: 18 16 18 16   Temp: 97.8 F (36.6 C) 98 F (36.7 C) 97.8 F (36.6 C) 97.7 F (36.5 C)  TempSrc: Oral Oral Oral   SpO2:  100% 99% 97%  Weight:      Height:        Intake/Output Summary (Last 24 hours) at 10/09/2024 0806 Last data filed at 10/08/2024 1356 Gross per 24 hour  Intake --  Output 600 ml  Net -600 ml    Filed Weights   10/04/24 0741  Weight: 79.4 kg    Examination:  General:  Pleasantly resting in bed, No acute distress. HEENT:  Normocephalic atraumatic.  Sclerae nonicteric, noninjected.  Extraocular movements intact bilaterally. Neck:  Without mass or deformity.  Trachea is midline. Lungs:  Clear to auscultate bilaterally without rhonchi, wheeze, or rales. Heart:  Regular rate and rhythm.  Without murmurs, rubs, or gallops. Abdomen:  Soft, nontender, nondistended.  Without guarding or rebound. Extremities: Without cyanosis, clubbing, edema, or obvious deformity. Skin:  Warm and dry, no erythema.   Data Reviewed: I have personally reviewed following labs and imaging studies  CBC: Recent Labs  Lab 10/03/24 2052 10/04/24 0430 10/05/24 0444  WBC 7.6 7.8 5.6  NEUTROABS 5.7  --   --   HGB 12.9 11.6* 11.4*  HCT 41.3 36.5 37.6  MCV 92.8 93.4 94.2  PLT  434* 348 333   Basic Metabolic Panel: Recent Labs  Lab 10/03/24 2052 10/04/24 0430 10/05/24 0444  NA 137 141 139  K 3.7 3.7 3.6  CL 101 108 105  CO2 23 24 25   GLUCOSE 138* 127* 91  BUN 12 10 9   CREATININE 0.70 0.54 0.68  CALCIUM 9.5 8.8* 9.4   GFR: Estimated Creatinine Clearance: 59.3 mL/min (by C-G formula based on SCr of 0.68 mg/dL). Liver Function Tests: Recent Labs  Lab 10/04/24 0430  AST 13*  ALT 8  ALKPHOS 172*  BILITOT 0.7  PROT 5.7*  ALBUMIN 3.3*   CBG: Recent Labs  Lab 10/05/24 0745 10/05/24 1151 10/05/24 1629 10/05/24 2204 10/06/24 0742  GLUCAP 101* 108* 86 93 72   No results found for this or any previous visit (from the past 240 hours).   Scheduled Meds:  azelastine   1 spray Each Nare BID   dapagliflozin  propanediol  10 mg Oral Daily   enoxaparin (LOVENOX) injection  40 mg Subcutaneous Q24H   fluticasone   2 spray Each Nare QHS   gabapentin   200 mg Oral QPM   lidocaine   1 patch Transdermal Q24H   loratadine  10 mg Oral Daily   methocarbamol  (ROBAXIN ) injection  500 mg Intravenous Q6H   olopatadine  1 drop Both Eyes BID   pantoprazole   40 mg Oral Daily   polyethylene glycol  17 g Oral BID   senna-docusate  1 tablet Oral BID   torsemide   20 mg Oral Daily   traMADol   50 mg Oral Once   Continuous Infusions:  promethazine  (PHENERGAN ) injection (IM or IVPB) Stopped (10/04/24 9385)     LOS: 6 days   Time spent:  Elsie JAYSON Montclair, DO [facility]  If 7PM-7AM, please contact night-coverage [email or web address]  10/09/2024, 8:06 AM

## 2024-10-09 NOTE — Plan of Care (Signed)

## 2024-10-09 NOTE — Plan of Care (Signed)

## 2024-10-10 DIAGNOSIS — S3219XA Other fracture of sacrum, initial encounter for closed fracture: Secondary | ICD-10-CM | POA: Diagnosis not present

## 2024-10-10 DIAGNOSIS — I159 Secondary hypertension, unspecified: Secondary | ICD-10-CM | POA: Diagnosis not present

## 2024-10-10 DIAGNOSIS — K219 Gastro-esophageal reflux disease without esophagitis: Secondary | ICD-10-CM | POA: Diagnosis not present

## 2024-10-10 LAB — CREATININE, SERUM
Creatinine, Ser: 0.66 mg/dL (ref 0.44–1.00)
GFR, Estimated: >60 mL/min (ref >60)

## 2024-10-10 NOTE — Progress Notes (Signed)
 Occupational Therapy Treatment Patient Details Name: Tracey Morgan MRN: [id number] DOB: Dec 21, 1945 Today's Date: 10/10/2024   History of present illness 78 y.o. female admitted with low back pain and bilateral hip pain.  CT L-spine and CT pelvis which showed bilateral periarticular longitudinal nondisplaced fracture of the anterior sacrum bilaterally of indeterminate age.  No pelvic hematoma, favored to be sacral insufficiency fractures.  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  Mild to moderate chronic superior endplate compression fracture of L2.s/p L THA, anterior approach on 07/27/2024 due to failure of conservative measures,. Pt PMH: L THA 07/2024, neuromuscular disorder, GERD, OA, back surgery and R THA (2022).   OT comments  Pt making steady progress toward goals. Able to take several steps to recliner after using the [facility] and assisting with pericare. Pt complaining of dizziness from vertigo - nsg made aware/pt requesting Meclizine. Patient will benefit from continued inpatient follow up therapy, <3 hours/day to maximize functional level of independence with goal to return home. Acute OT to follow. .       If plan is discharge home, recommend the following:  A lot of help with walking and/or transfers;A lot of help with bathing/dressing/bathroom;Assistance with cooking/housework;Assist for transportation;Help with stairs or ramp for entrance   Equipment Recommendations  BSC/3in1    Recommendations for Other Services      Precautions / Restrictions Precautions Precautions: Fall Precaution/Restrictions Comments: back precautions used to help with pain/log rolling Restrictions Weight Bearing Restrictions Per Provider Order: No       Mobility Bed Mobility Overal bed mobility: Needs Assistance Bed Mobility: Supine to Sit     Supine to sit: Contact guard          Transfers Overall transfer level: Needs assistance Equipment used: Rolling walker (2  wheels) Transfers: Sit to/from Stand, Bed to chair/wheelchair/BSC Sit to Stand: Min assist, +2 safety/equipment     Step pivot transfers: Min assist, +2 safety/equipment     General transfer comment: cues ot stay inside the RW to offload legs     Balance     Sitting balance-Leahy Scale: Good       Standing balance-Leahy Scale: Poor                             ADL either performed or assessed with clinical judgement   ADL Overall ADL's : Needs assistance/impaired     Grooming: Set up;Sitting   Upper Body Bathing: Set up;Sitting   Lower Body Bathing: Moderate assistance;Sit to/from stand   Upper Body Dressing : Set up;Sitting   Lower Body Dressing: Moderate assistance;Sit to/from stand   Toilet Transfer: Minimal assistance;Ambulation;BSC/3in1;Rolling walker (2 wheels)   Toileting- Clothing Manipulation and Hygiene: Moderate assistance       Functional mobility during ADLs: Minimal assistance;Rolling walker (2 wheels);Cueing for safety;+2 for safety/equipment General ADL Comments: uses AE at baseline - sock aide/reacher. Able to complete pericare with assistance - completing form seated and semi standing position - best in seated position    Extremity/Trunk Assessment Upper Extremity Assessment Upper Extremity Assessment: Right hand dominant   Lower Extremity Assessment Lower Extremity Assessment: Defer to PT evaluation                              Cognition Arousal: Alert Behavior During Therapy: WFL for tasks assessed/performed, Anxious Cognition: No apparent impairments  Cueing      Exercises Exercises: Other exercises Other Exercises Other Exercises: encouraged chair level marching/LAOQ; BUE AROM as tolerated    Shoulder Instructions       General Comments Pt sat on special cushion/cut out that she brought from home    Pertinent Vitals/ Pain       Pain  Assessment Pain Assessment: 0-10 Pain Score: 8  Pain Location: low back/buttocks Pain Descriptors / Indicators: Aching, Discomfort, Crying, Grimacing, Guarding, Cramping Pain Intervention(s): Limited activity within patient's tolerance, Premedicated before session  Home Living                                          Prior Functioning/Environment              Frequency  Min 2X/week        Progress Toward Goals  OT Goals(current goals can now be found in the care plan section)  Progress towards OT goals: Progressing toward goals  Acute Rehab OT Goals Patient Stated Goal: get better OT Goal Formulation: With patient Time For Goal Achievement: 10/18/24 Potential to Achieve Goals: Good ADL Goals Pt Will Perform Lower Body Bathing: with min assist;sit to/from stand;with adaptive equipment Pt Will Perform Lower Body Dressing: with min assist;with adaptive equipment;sit to/from stand Pt Will Transfer to Toilet: with min assist;ambulating Pt Will Perform Toileting - Clothing Manipulation and hygiene: with min assist;sit to/from stand;sitting/lateral leans;with adaptive equipment  Plan      Co-evaluation    PT/OT/SLP Co-Evaluation/Treatment: Yes Reason for Co-Treatment: To address functional/ADL transfers   OT goals addressed during session: ADL's and self-care      AM-PAC OT 6 Clicks Daily Activity     Outcome Measure   Help from another person eating meals?: None Help from another person taking care of personal grooming?: A Little Help from another person toileting, which includes using toliet, bedpan, or urinal?: A Lot Help from another person bathing (including washing, rinsing, drying)?: A Lot Help from another person to put on and taking off regular upper body clothing?: A Little Help from another person to put on and taking off regular lower body clothing?: A Lot 6 Click Score: 16    End of Session Equipment Utilized During Treatment:  Gait belt;Rolling walker (2 wheels)  OT Visit Diagnosis: Unsteadiness on feet (R26.81);Other abnormalities of gait and mobility (R26.89);Muscle weakness (generalized) (M62.81);Pain Pain - part of body:  (low back/buttocks)   Activity Tolerance Patient tolerated treatment well   Patient Left in chair;with call bell/phone within reach;with chair alarm set   Nurse Communication Mobility status        Time: 8880-8853 OT Time Calculation (min): 27 min  Charges: OT General Charges $OT Visit: 1 Visit OT Treatments $Self Care/Home Management : 8-22 mins  Kreg Sink, OT/L   Acute OT Clinical Specialist Acute Rehabilitation Services Pager [phone] Office [phone]   [facility] 10/10/2024, 11:55 AM

## 2024-10-10 NOTE — Plan of Care (Signed)

## 2024-10-10 NOTE — Progress Notes (Signed)
 PROGRESS NOTE    Tracey Morgan  FMW:[id number] DOB: 04-06-1946 DOA: 10/03/2024 PCP: Beverley Norleen NOVAK, MD   Brief Narrative:  Patient is a 78 year old female with degenerative disc disease of chronic cervical, GERD, neuromuscular disorder, borderline diabetes, diverticular disease, HTN, history of scoliosis presented to ED with severe low back pain.  Patient Dors that she has been following with orthopedic surgery outpatient, was seen last week and started on prednisone  tapering dose and oral Dilaudid  however pain continued to worsen.  Radiating down to bilateral knees, having difficulty ambulating.  Hospitalist called for further evaluation.  Assessment & Plan:   Principal Problem:   Sacral fracture (HCC) Active Problems:   OA (osteoarthritis) of hip   Primary osteoarthritis of right hip   Gastro-esophageal reflux disease without esophagitis   Hypertension   Morbid obesity (HCC)   Type 2 diabetes mellitus (HCC)   Acute versus subacute sacral fracture (HCC) Ambulatory dysfunction, acute on chronic - No reported fall or trauma, orthopedics consulted and following, appreciate insight recommendations -Orthopedics following, unfortunately no surgical intervention/options at this time - PT OT continue to work with patient to improve ambulatory dysfunction - Increase frequency of Dilaudid  today, patient did not require tramadol  previously   Constipation, resolved - Likely multifactorial given above - Increase ambulation/OOB as tolerated - Improved with increased bowel regimen - multiple BM since 11/2  Primary osteoarthritis of right hip -History of bilateral hip replacements, no acute issues GERD - Continue PPI Pre-diabetes -Hemoglobin A1c 6.4 - Continue SSI, sensitive.  Continue Farxiga  HTN-hold home torsemide , as needed labetalol  as needed  Obesity class I Body mass index is 32.01 kg/m.  DVT prophylaxis: enoxaparin (LOVENOX) injection 40 mg Start: 10/04/24 1000 Code Status:    Code Status: Full Code Family Communication: At bedside  Status is: Inpatient  Dispo: The patient is from: Home              Anticipated d/c is to: To be determined              Anticipated d/c date is: To be determined              Patient currently not medically stable for discharge  Consultants:  Orthopedic surgery  Procedures:  None  Antimicrobials:  None indicated  Subjective: No acute issues or events overnight, continues to report pain but moderately improving.  Able to ambulate to bedside commode for bowel movement earlier in the day.  Objective: Vitals:   10/09/24 0515 10/09/24 1800 10/09/24 2035 10/10/24 0455  BP: 129/64 (!) 142/86 (!) 116/104 132/74  Pulse: 82 98 82 70  Resp: 16  18 18   Temp: 97.7 F (36.5 C) 97.9 F (36.6 C) 98.1 F (36.7 C) 97.7 F (36.5 C)  TempSrc:  Oral Oral Oral  SpO2: 97% 99% 91% 99%  Weight:      Height:        Intake/Output Summary (Last 24 hours) at 10/10/2024 0747 Last data filed at 10/09/2024 1730 Gross per 24 hour  Intake 960 ml  Output --  Net 960 ml    Filed Weights   10/04/24 0741  Weight: 79.4 kg    Examination:  General:  Pleasantly resting in bed, No acute distress. HEENT:  Normocephalic atraumatic.  Sclerae nonicteric, noninjected.  Extraocular movements intact bilaterally. Neck:  Without mass or deformity.  Trachea is midline. Lungs:  Clear to auscultate bilaterally without rhonchi, wheeze, or rales. Heart:  Regular rate and rhythm.  Without murmurs, rubs, or gallops. Abdomen:  Soft, nontender, nondistended.  Without guarding or rebound. Extremities: Without cyanosis, clubbing, edema, or obvious deformity. Skin:  Warm and dry, no erythema.   Data Reviewed: I have personally reviewed following labs and imaging studies  CBC: Recent Labs  Lab 10/03/24 2052 10/04/24 0430 10/05/24 0444  WBC 7.6 7.8 5.6  NEUTROABS 5.7  --   --   HGB 12.9 11.6* 11.4*  HCT 41.3 36.5 37.6  MCV 92.8 93.4 94.2  PLT 434*  348 333   Basic Metabolic Panel: Recent Labs  Lab 10/03/24 2052 10/04/24 0430 10/05/24 0444 10/10/24 0419  NA 137 141 139  --   K 3.7 3.7 3.6  --   CL 101 108 105  --   CO2 23 24 25   --   GLUCOSE 138* 127* 91  --   BUN 12 10 9   --   CREATININE 0.70 0.54 0.68 0.66  CALCIUM 9.5 8.8* 9.4  --    GFR: Estimated Creatinine Clearance: 59.3 mL/min (by C-G formula based on SCr of 0.66 mg/dL). Liver Function Tests: Recent Labs  Lab 10/04/24 0430  AST 13*  ALT 8  ALKPHOS 172*  BILITOT 0.7  PROT 5.7*  ALBUMIN 3.3*   CBG: Recent Labs  Lab 10/05/24 0745 10/05/24 1151 10/05/24 1629 10/05/24 2204 10/06/24 0742  GLUCAP 101* 108* 86 93 72   No results found for this or any previous visit (from the past 240 hours).   Scheduled Meds:  azelastine   1 spray Each Nare BID   dapagliflozin  propanediol  10 mg Oral Daily   enoxaparin (LOVENOX) injection  40 mg Subcutaneous Q24H   fluticasone   2 spray Each Nare QHS   gabapentin   200 mg Oral QPM   lidocaine   1 patch Transdermal Q24H   loratadine  10 mg Oral Daily   methocarbamol  (ROBAXIN ) injection  500 mg Intravenous Q6H   olopatadine  1 drop Both Eyes BID   pantoprazole   40 mg Oral Daily   polyethylene glycol  17 g Oral BID   torsemide   20 mg Oral Daily   traMADol   50 mg Oral Once   Continuous Infusions:  promethazine  (PHENERGAN ) injection (IM or IVPB) Stopped (10/04/24 9385)     LOS: 7 days   Time spent:  Elsie JAYSON Montclair, DO [facility]  If 7PM-7AM, please contact night-coverage [email or web address]  10/10/2024, 7:47 AM

## 2024-10-10 NOTE — Progress Notes (Signed)
 Physical Therapy Treatment Patient Details Name: KARAGAN LEHR MRN: [id number] DOB: 08/27/1946 Today's Date: 10/10/2024   History of Present Illness 78 y.o. female admitted with low back pain and bilateral hip pain.  CT L-spine and CT pelvis which showed bilateral periarticular longitudinal nondisplaced fracture of the anterior sacrum bilaterally of indeterminate age.  No pelvic hematoma, favored to be sacral insufficiency fractures.  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  Mild to moderate chronic superior endplate compression fracture of L2.s/p L THA, anterior approach on 07/27/2024 due to failure of conservative measures,. Pt PMH: L THA 07/2024, neuromuscular disorder, GERD, OA, back surgery and R THA (2022).    PT Comments   The patient demonstrates improved  mobility, getting to sitting  to bed edge using rail. Patient able to stand and step to [facility] using RW,  then stand  and  perform post BM care with min assist to steady, using  1 UE support on RW. Patient able to take about 6 steps to recliner using RW.  Patient  does really put forth effort to progress activity.  Patient will benefit from continued inpatient follow up therapy, <3 hours/day    If plan is discharge home, recommend the following: Two people to help with walking and/or transfers;A lot of help with bathing/dressing/bathroom;Assist for transportation;Help with stairs or ramp for entrance;Assistance with cooking/housework   Can travel by private vehicle     No  Equipment Recommendations  None recommended by PT    Recommendations for Other Services       Precautions / Restrictions Precautions Precaution/Restrictions Comments: now BM urgency Restrictions Weight Bearing Restrictions Per Provider Order: No     Mobility  Bed Mobility Overal bed mobility: Needs Assistance Bed Mobility: Sidelying to Sit, Rolling Rolling: Supervision, Used rails Sidelying to sit: Contact guard assist, HOB elevated,  Used rails       General bed mobility comments: patient slowly moves herself slowly, knows how to move and adjust with least pain. patient rolled, moveds legs over  edge and the able to push self to upiright.    Transfers Overall transfer level: Needs assistance Equipment used: Rolling walker (2 wheels) Transfers: Sit to/from Stand, Bed to chair/wheelchair/BSC Sit to Stand: Min assist, +2 safety/equipment   Step pivot transfers: Min assist, +2 safety/equipment       General transfer comment: cues ot stay inside the RW to offload legs, cues for hand placement to reach to [facility] and recliner. patient able to take ~ 6 steps to get to recliner    Ambulation/Gait                   Stairs             Wheelchair Mobility     Tilt Bed    Modified Rankin (Stroke Patients Only)       Balance Overall balance assessment: Needs assistance Sitting-balance support: Feet supported, Bilateral upper extremity supported Sitting balance-Leahy Scale: Good Sitting balance - Comments: reliant on the UE's   for trunk support,  when relaeses UE support, reports increased pain and requested to return to  sidelying.   Standing balance support: Bilateral upper extremity supported, During functional activity, Reliant on assistive device for balance Standing balance-Leahy Scale: Poor                              Communication Communication Communication: No apparent difficulties  Cognition Arousal:  Alert Behavior During Therapy: WFL for tasks assessed/performed, Anxious   PT - Cognitive impairments: No apparent impairments                         Following commands: Intact      Cueing    Exercises      General Comments General comments (skin integrity, edema, etc.): Pt sat on special cushion/cut out that she brought from home      Pertinent Vitals/Pain Pain Assessment Pain Assessment: Faces Faces Pain Scale: Hurts even more Pain Location:  when WB, buttocks Pain Descriptors / Indicators: Aching, Discomfort, Grimacing, Guarding    Home Living                          Prior Function            PT Goals (current goals can now be found in the care plan section) Progress towards PT goals: Progressing toward goals    Frequency    Min 3X/week      PT Plan      Co-evaluation PT/OT/SLP Co-Evaluation/Treatment: Yes Reason for Co-Treatment: To address functional/ADL transfers PT goals addressed during session: Mobility/safety with mobility OT goals addressed during session: ADL's and self-care      AM-PAC PT 6 Clicks Mobility   Outcome Measure  Help needed turning from your back to your side while in a flat bed without using bedrails?: A Little Help needed moving from lying on your back to sitting on the side of a flat bed without using bedrails?: A Little Help needed moving to and from a bed to a chair (including a wheelchair)?: A Little Help needed standing up from a chair using your arms (e.g., wheelchair or bedside chair)?: A Little Help needed to walk in hospital room?: Total Help needed climbing 3-5 steps with a railing? : Total 6 Click Score: 14    End of Session Equipment Utilized During Treatment: Gait belt Activity Tolerance: Patient tolerated treatment well Patient left: in chair;with call bell/phone within reach Nurse Communication: Mobility status PT Visit Diagnosis: Unsteadiness on feet (R26.81);Other abnormalities of gait and mobility (R26.89);Pain Pain - part of body: Leg     Time: 8876-8845 PT Time Calculation (min) (ACUTE ONLY): 31 min  Charges:    $Therapeutic Activity: 8-22 mins PT General Charges $$ ACUTE PT VISIT: 1 Visit                     Darice Potters PT Acute Rehabilitation Services Office [phone]    Potters Darice Norris 10/10/2024, 12:42 PM

## 2024-10-10 NOTE — TOC Progression Note (Addendum)
 Transition of Care [facility]) - Progression Note    Patient Details  Name: Tracey Morgan MRN: [id number] Date of Birth: 09-11-1946  [facility] CM/SW Contact  Doneta Glenys DASEN, RN Phone Number: 10/10/2024, 1:22 PM  Clinical Narrative:    CM spoke with [name]. [name] requested additional PT notes. CM faxed [phone] a requested notes to [name]. CM informed patient of the $48 dollars daily payment that insurance does not cover. Patient agreeable to cost. [name] updated. CM called [employer] left message to start insurance auth. 1:32 PM [facility]TA Ellouise Pouch returned call and insurance auth for [name] started.   Expected Discharge Plan: Skilled Nursing Facility Barriers to Discharge: Continued Medical Work up, SNF Pending bed offer               Expected Discharge Plan and Services In-house Referral: NA Discharge Planning Services: CM Consult   Living arrangements for the past 2 months: Single Family Home                 DME Arranged: N/A DME Agency: NA       HH Arranged: NA HH Agency: NA         Social Drivers of Health (SDOH) Interventions SDOH Screenings   Food Insecurity: No Food Insecurity (10/04/2024)  Housing: Low Risk  (10/04/2024)  Transportation Needs: No Transportation Needs (10/04/2024)  Utilities: Not At Risk (10/04/2024)  Social Connections: Moderately Isolated (10/04/2024)  Tobacco Use: Low Risk  (10/07/2024)    Readmission Risk Interventions    10/06/2024    5:20 PM  Readmission Risk Prevention Plan  Post Dischage Appt Complete  Medication Screening Complete  Transportation Screening Complete

## 2024-10-11 ENCOUNTER — Encounter

## 2024-10-11 DIAGNOSIS — I159 Secondary hypertension, unspecified: Secondary | ICD-10-CM | POA: Diagnosis not present

## 2024-10-11 DIAGNOSIS — S3219XA Other fracture of sacrum, initial encounter for closed fracture: Secondary | ICD-10-CM | POA: Diagnosis not present

## 2024-10-11 DIAGNOSIS — K219 Gastro-esophageal reflux disease without esophagitis: Secondary | ICD-10-CM | POA: Diagnosis not present

## 2024-10-11 MED ORDER — ONDANSETRON HCL 4 MG PO TABS: 4.0000 mg | ORAL_TABLET | Freq: Four times a day (QID) | ORAL | 0 refills | Status: AC | PRN

## 2024-10-11 MED ORDER — PROMETHAZINE HCL 6.25 MG/5ML PO SOLN: 6.2500 mg | Freq: Four times a day (QID) | ORAL | 0 refills | Status: AC | PRN

## 2024-10-11 MED ORDER — BISACODYL 10 MG RE SUPP: 10.0000 mg | Freq: Every day | RECTAL | 0 refills | Status: AC | PRN

## 2024-10-11 MED ORDER — HYDROMORPHONE HCL 2 MG PO TABS: 2.0000 mg | ORAL_TABLET | Freq: Four times a day (QID) | ORAL | 0 refills | Status: AC | PRN

## 2024-10-11 NOTE — NC FL2 (Signed)
 [place]  MEDICAID FL2 LEVEL OF CARE FORM     IDENTIFICATION  Patient Name: Tracey Morgan Birthdate: 1946/03/02 Sex: female Admission Date (Current Location): 10/03/2024  [facility] and [place] Number:  [employer], Television/film/video and Address:  [facility],  [address], [place] [address]      Provider Number: [id number]  Attending Physician Name and Address:  Lue Elsie BROCKS, MD  Relative Name and Phone Number:       Current Level of Care: Hospital Recommended Level of Care: Skilled Nursing Facility Prior Approval Number:    Date Approved/Denied:   PASRR Number: [id number]  Discharge Plan: SNF    Current Diagnoses: Patient Active Problem List   Diagnosis Date Noted   Sacral fracture (HCC) 10/03/2024   Morbid obesity (HCC) 10/03/2024   Primary osteoarthritis of left hip 07/27/2024   OA (osteoarthritis) of hip 03/27/2021   Primary osteoarthritis of right hip 03/27/2021   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   Hypertension 12/20/2019   Type 2 diabetes mellitus (HCC) 11/26/2018   Esophageal dysphagia 04/25/2016   Gastro-esophageal reflux disease without esophagitis 04/25/2016    Orientation RESPIRATION BLADDER Height & Weight     Self, Time, Situation, Place  Normal Continent Weight: 79.4 kg Height:  5' 2 (157.5 cm)  BEHAVIORAL SYMPTOMS/MOOD NEUROLOGICAL BOWEL NUTRITION STATUS      Continent Diet  AMBULATORY STATUS COMMUNICATION OF NEEDS Skin   Extensive Assist Verbally Normal                       Personal Care Assistance Level of Assistance  Bathing, Feeding, Dressing Bathing Assistance: Limited assistance Feeding assistance: Limited assistance Dressing Assistance: Limited assistance     Functional Limitations Info             SPECIAL CARE FACTORS FREQUENCY  PT (By licensed PT), OT (By licensed OT)     PT Frequency: 5X weekly OT Frequency: 5X weekly            Contractures      Additional Factors  Info  Code Status, Allergies Code Status Info: FULL Allergies Info: Hydralazine, Metformin, Metronidazole , Percocet (Oxycodone-acetaminophen ), Buprenorphine Hcl, Codeine, Hydrocodone , Morphine And Codeine           Current Medications (10/11/2024):  This is the current hospital active medication list Current Facility-Administered Medications  Medication Dose Route Frequency Provider Last Rate Last Admin   acetaminophen  (TYLENOL ) tablet 325 mg  325 mg Oral Q4H PRN Lue Elsie BROCKS, MD       azelastine  (ASTELIN ) 0.1 % nasal spray 1 spray  1 spray Each Nare BID Sim Emery CROME, MD   1 spray at 10/11/24 1007   bisacodyl  (DULCOLAX) suppository 10 mg  10 mg Rectal Daily PRN Lue Elsie BROCKS, MD   10 mg at 10/09/24 1553   dapagliflozin  propanediol (FARXIGA ) tablet 10 mg  10 mg Oral Daily Garba, Mohammad L, MD   10 mg at 10/11/24 9041   diphenhydrAMINE  (BENADRYL ) capsule 25 mg  25 mg Oral Q6H PRN Garba, Mohammad L, MD       enoxaparin (LOVENOX) injection 40 mg  40 mg Subcutaneous Q24H Garba, Mohammad L, MD   40 mg at 10/11/24 1000   famotidine  (PEPCID ) tablet 40 mg  40 mg Oral QHS PRN Garba, Mohammad L, MD       fluticasone  (FLONASE ) 50 MCG/ACT nasal spray 2 spray  2 spray Each Nare QHS Lue Elsie BROCKS, MD   2  spray at 10/10/24 2037   gabapentin  (NEURONTIN ) capsule 200 mg  200 mg Oral QPM Lue Elsie BROCKS, MD   200 mg at 10/10/24 1615   HYDROmorphone  (DILAUDID ) tablet 2-4 mg  2-4 mg Oral Q4H PRN Lue Elsie BROCKS, MD   4 mg at 10/11/24 9041   labetalol  (NORMODYNE ) injection 10 mg  10 mg Intravenous Q4H PRN Rai, Ripudeep K, MD       lidocaine  (LIDODERM ) 5 % 1 patch  1 patch Transdermal Q24H Rai, Ripudeep K, MD   1 patch at 10/11/24 1000   loratadine (CLARITIN) tablet 10 mg  10 mg Oral Daily Garba, Mohammad L, MD   10 mg at 10/10/24 2224   meclizine (ANTIVERT) tablet 25 mg  25 mg Oral Q8H PRN Lue Elsie BROCKS, MD   25 mg at 10/10/24 2043   methocarbamol  (ROBAXIN ) injection 500  mg  500 mg Intravenous Q6H Rai, Ripudeep K, MD   500 mg at 10/11/24 1000   olopatadine (PATANOL) 0.1 % ophthalmic solution 1 drop  1 drop Both Eyes BID Lue Elsie BROCKS, MD   1 drop at 10/10/24 0913   ondansetron  (ZOFRAN ) injection 4 mg  4 mg Intravenous Q6H PRN Rai, Ripudeep K, MD   4 mg at 10/11/24 0346   pantoprazole  (PROTONIX ) EC tablet 40 mg  40 mg Oral Daily Garba, Mohammad L, MD   40 mg at 10/11/24 0958   polyethylene glycol (MIRALAX  / GLYCOLAX ) packet 17 g  17 g Oral BID Lue Elsie BROCKS, MD   17 g at 10/09/24 2100   promethazine  (PHENERGAN ) 12.5 mg in sodium chloride  0.9 % 50 mL IVPB  12.5 mg Intravenous Q6H PRN Ula Prentice SAUNDERS, MD   Stopped at 10/04/24 9385   torsemide  (DEMADEX ) tablet 20 mg  20 mg Oral Daily Lue Elsie BROCKS, MD       traMADol  (ULTRAM ) tablet 50 mg  50 mg Oral Once Lue Elsie BROCKS, MD         Discharge Medications: Please see discharge summary for a list of discharge medications.  Relevant Imaging Results:  Relevant Lab Results:   Additional Information SSN:   [id number]  Doneta Glenys DASEN, RN

## 2024-10-11 NOTE — Plan of Care (Signed)

## 2024-10-11 NOTE — TOC Progression Note (Addendum)
 Transition of Care [facility]) - Progression Note    Patient Details  Name: Tracey Morgan MRN: [id number] Date of Birth: Mar 08, 1946  [facility]) CM/SW Contact  Doneta Glenys DASEN, RN Phone Number: 10/11/2024, 11:09 AM  Clinical Narrative:    [name] called for update. CM called [facility] Tammy, said shara is currently under nurse review and will call when complete. 12:19 PM SNF auth approved by [facility] ref. # - [id number], [facility]AR ref. # - [id number]. CM updated [name] and requested bed availability. Has open bed. CM update Dr. Lue updated on auth approval and bed availability.   Expected Discharge Plan: Skilled Nursing Facility Barriers to Discharge: Continued Medical Work up, SNF Pending bed offer               Expected Discharge Plan and Services In-house Referral: NA Discharge Planning Services: CM Consult   Living arrangements for the past 2 months: Single Family Home                 DME Arranged: N/A DME Agency: NA       HH Arranged: NA HH Agency: NA         Social Drivers of Health (SDOH) Interventions SDOH Screenings   Food Insecurity: No Food Insecurity (10/04/2024)  Housing: Low Risk  (10/04/2024)  Transportation Needs: No Transportation Needs (10/04/2024)  Utilities: Not At Risk (10/04/2024)  Social Connections: Moderately Isolated (10/04/2024)  Tobacco Use: Low Risk  (10/07/2024)    Readmission Risk Interventions    10/06/2024    5:20 PM  Readmission Risk Prevention Plan  Post Dischage Appt Complete  Medication Screening Complete  Transportation Screening Complete

## 2024-10-11 NOTE — Discharge Summary (Signed)
 Physician Discharge Summary  Tracey Morgan:[id number] DOB: Feb 19, 1946 DOA: 10/03/2024  PCP: Beverley Norleen NOVAK, MD  Admit date: 10/03/2024 Discharge date: 10/11/2024  Admitted From: Home Disposition: SNF  Recommendations for Outpatient Follow-up:  Follow up with PCP in 1-2 weeks  Discharge Condition: Stable CODE STATUS: Full Diet recommendation: Low-salt low-fat low-carb diet  Brief/Interim Summary: Patient is a 78 year old female with degenerative disc disease of chronic cervical, GERD, neuromuscular disorder, borderline diabetes, diverticular disease, HTN, history of scoliosis presented to ED with severe low back pain.  Patient Dors that she has been following with orthopedic surgery outpatient, was seen last week and started on prednisone  tapering dose and oral Dilaudid  however pain continued to worsen.  Radiating down to bilateral knees, having difficulty ambulating.  Hospitalist called for further evaluation.  Patient admitted as above with acute for subacute sacral fracture with profound ambulatory dysfunction from baseline.  Patient continued to be evaluated by physical therapy and in the setting of profound pain and ambulatory dysfunction discussion was had and recommendations were made for discharge to skilled nursing facility.  She is now otherwise stable and agreeable for discharge to rehab for ongoing physical therapy and pain management.  She currently is well-controlled on her current pain regimen, will follow closely for constipation given prior history of similar with pain medications.  At this time would recommend outpatient follow-up with PCP as scheduled, no indication at this time to follow-up with orthopedic surgery however if she continues to have worsening pain or ambulatory dysfunction not improving with therapy as expected may be beneficial to be evaluated with orthopedic surgery and have repeat imaging.    Discharge Diagnoses:  Principal Problem:   Sacral fracture  (HCC) Active Problems:   OA (osteoarthritis) of hip   Primary osteoarthritis of right hip   Gastro-esophageal reflux disease without esophagitis   Hypertension   Morbid obesity (HCC)   Type 2 diabetes mellitus (HCC)  Acute versus subacute sacral fracture (HCC) Ambulatory dysfunction, acute on chronic - No reported fall or trauma, orthopedics consulted and following, appreciate insight recommendations -Orthopedics following, unfortunately no surgical intervention/options at this time - PT OT continue to work with patient to improve ambulatory dysfunction -recommending discharge to SNF - Continue Dilaudid    Constipation, resolved - Likely multifactorial given above - Increase ambulation/OOB as tolerated - Improved with increased bowel regimen -titrate in the outpatient setting as appropriate to avoid constipation or diarrhea/loose stool   Primary osteoarthritis of right hip -History of bilateral hip replacements, no acute issues GERD - Continue PPI Pre-diabetes -Hemoglobin A1c 6.4 - Continue SSI, sensitive.  Continue Farxiga  HTN-hold home torsemide , as needed labetalol  as needed   Obesity class I Body mass index is 32.01 kg/m.  Discharge Instructions  Discharge Instructions     Call MD for:  difficulty breathing, headache or visual disturbances   Complete by: As directed    Call MD for:  extreme fatigue   Complete by: As directed    Call MD for:  hives   Complete by: As directed    Call MD for:  persistant dizziness or light-headedness   Complete by: As directed    Call MD for:  persistant nausea and vomiting   Complete by: As directed    Call MD for:  severe uncontrolled pain   Complete by: As directed    Call MD for:  temperature >100.4   Complete by: As directed    Diet - low sodium heart healthy   Complete by: As directed  Increase activity slowly   Complete by: As directed       Allergies as of 10/11/2024       Reactions   Hydralazine Anaphylaxis, Nausea  Only   Patient had nausea and headache with po hydralazine 10mg     Metformin Other (See Comments)   Severe colitis.   Metronidazole  Other (See Comments)   Not specified   Percocet [oxycodone-acetaminophen ]    Pt is unsure    Buprenorphine Hcl Nausea And Vomiting, Rash, Other (See Comments)   hallucinations   Codeine Nausea And Vomiting, Rash, Other (See Comments)   Hallucinations   Hydrocodone  Nausea And Vomiting, Rash   Morphine And Codeine Nausea And Vomiting, Rash, Other (See Comments)   hallucinations        Medication List     STOP taking these medications    docusate sodium  100 MG capsule Commonly known as: COLACE   EPINEPHrine  0.3 mg/0.3 mL Soaj injection Commonly known as: EPI-PEN       TAKE these medications    acetaminophen  500 MG tablet Commonly known as: TYLENOL  Take 500 mg by mouth every 6 (six) hours as needed for headache or moderate pain (pain score 4-6).   azelastine  0.1 % nasal spray Commonly known as: ASTELIN  USE 1 DOSE IN EACH NOSTRIL TWICE DAILY AS DIRECTED What changed: See the new instructions.   bisacodyl  10 MG suppository Commonly known as: DULCOLAX Place 1 suppository (10 mg total) rectally daily as needed for moderate constipation or severe constipation.   cetirizine  10 MG tablet Commonly known as: ZYRTEC  Take 1 tablet by mouth once daily   diphenhydrAMINE  25 MG tablet Commonly known as: BENADRYL  Take 25 mg by mouth every 6 (six) hours as needed for allergies or itching.   famotidine  40 MG tablet Commonly known as: PEPCID  Take 40 mg by mouth at bedtime as needed for heartburn or indigestion.   Farxiga  5 MG Tabs tablet Generic drug: dapagliflozin  propanediol Take 10 mg by mouth daily.   fluticasone  50 MCG/ACT nasal spray Commonly known as: FLONASE  Place 2 sprays into both nostrils every evening.   gabapentin  100 MG capsule Commonly known as: NEURONTIN  Take 200 mg by mouth every evening.   HYDROmorphone  2 MG  tablet Commonly known as: DILAUDID  Take 1-2 tablets (2-4 mg total) by mouth every 6 (six) hours as needed for moderate pain (pain score 4-6) or severe pain (pain score 7-10).   magnesium  30 MG tablet Take 30 mg by mouth every evening.   meclizine 25 MG tablet Commonly known as: ANTIVERT Take 25 mg by mouth every 8 (eight) hours as needed for dizziness.   methocarbamol  500 MG tablet Commonly known as: ROBAXIN  Take 1 tablet (500 mg total) by mouth every 6 (six) hours as needed for muscle spasms.   Olopatadine HCl 0.2 % Soln Place 1 drop into both eyes daily as needed.   ondansetron  4 MG tablet Commonly known as: ZOFRAN  Take 1 tablet (4 mg total) by mouth every 6 (six) hours as needed for nausea or vomiting. What changed: reasons to take this   pantoprazole  40 MG tablet Commonly known as: PROTONIX  Take 40 mg by mouth daily.   polyethylene glycol 17 g packet Commonly known as: MIRALAX  / GLYCOLAX  Take 17 g by mouth daily as needed for mild constipation.   POTASSIUM PO Take 99 mg by mouth every evening.   promethazine  6.25 MG/5ML solution Commonly known as: PHENERGAN  Take 5 mLs (6.25 mg total) by mouth 4 (four) times daily as  needed for refractory nausea / vomiting.   torsemide  20 MG tablet Commonly known as: DEMADEX  Take 20 mg by mouth daily.   triamcinolone lotion 0.1 % Commonly known as: KENALOG Apply 1 Application topically 2 (two) times daily as needed.   VITAMIN K2-VITAMIN D3 PO Take 1 tablet by mouth every evening.        Contact information for after-discharge care     Destination     [name] .   Service: Skilled Nursing Contact information: [address] [place]  [address] [phone]                    Allergies  Allergen Reactions   Hydralazine Anaphylaxis and Nausea Only    Patient had nausea and headache with po hydralazine 10mg     Metformin Other (See Comments)    Severe colitis.   Metronidazole  Other (See  Comments)    Not specified   Percocet [Oxycodone-Acetaminophen ]     Pt is unsure    Buprenorphine Hcl Nausea And Vomiting, Rash and Other (See Comments)    hallucinations    Codeine Nausea And Vomiting, Rash and Other (See Comments)    Hallucinations   Hydrocodone  Nausea And Vomiting and Rash   Morphine And Codeine Nausea And Vomiting, Rash and Other (See Comments)    hallucinations    Consultations: Orthopedic surgery  Procedures/Studies: CT PELVIS WO CONTRAST Addendum Date: 10/03/2024 ** ADDENDUM #1 ** ADDENDUM: In retrospect, there are bilateral periarticular longitudinal nondisplaced fractures of the anterior sacrum new from 04/27/2024 but of indeterminate age. no pelvic hematoma is associated with this. These are favored to be sacral insufficiency fractures. ---------------------------------------------------- Electronically signed by: Francis Quam MD 10/03/2024 11:03 PM EDT RP Workstation: [id number]   Result Date: 10/03/2024 ** ORIGINAL REPORT ** EXAM: CT Pelvis, Without IV Contrast 10/03/2024 10:07:00 PM TECHNIQUE: Axial images were acquired through the pelvis without IV contrast. Reformatted images were reviewed. Automated exposure control, iterative reconstruction, and/or weight based adjustment of the mA/kV was utilized to reduce the radiation dose to as low as reasonably achievable. COMPARISON: CT abdomen and pelvis with contrast 04/27/2024. CLINICAL HISTORY: Pelvic pain, chronic, post-menopausal. Per chart: Pt c/o sacral and hip pain worsening over the last few weeks. Pt has hx of sciatica and states pain is similar. No injuries reported per pt. FINDINGS: BONES: There is osteopenia. No acute fracture or focal pathologic lesion is seen in the visualized lower lumbar spine, sacrum, bony pelvis, and visualized proximal femurs. At L4-L5, again noted is advanced degenerative disc disease, anterior bridging osteophytes, and posterior osteophytes lateralizing to the right with chronic  mass effect on the right greater than left descending L5 nerve roots and mild acquired spinal stenosis. There is left greater than right foraminal stenosis at this level. Advanced left and moderate right facet hypertrophy also. This was seen previously. JOINTS: No dislocation. There is spray artifact related to bilateral hip replacements, interval left hip replacement since 04/27/2024. There is chronic degenerative sclerosis overlying the superior left acetabular borders. SOFT TISSUES: There is a small umbilical fat hernia, and small midline and right paramedian infraumbilical fat hernias. There is no incarcerated hernia. No acute soft tissue abnormality is seen in the pelvis. INTRAPELVIC CONTENTS: The pelvic bowel is unremarkable aside from uncomplicated sigmoid diverticulosis and a rectosigmoid surgical colocolonic anastomosis. Limited images of the intrapelvic contents demonstrate no acute abnormality. IMPRESSION: 1. No acute osseous abnormality. 2. Advanced degenerative disc disease at L4-L5 with chronic mass effect on the right greater than  left descending L5 nerve roots and mild acquired spinal stenosis. Left greater than right foraminal stenosis at this level. Advanced left and moderate right facet hypertrophy. 3. Bilateral hip replacements with interval left hip replacement since 04/27/2024. 4. Osteopenia and degenerative change. 5. Anterior wall fat  hernias described above. Electronically signed by: Francis Quam MD 10/03/2024 10:34 PM EDT RP Workstation: [id number]   CT Lumbar Spine Wo Contrast Result Date: 10/03/2024 EXAM: CT OF THE LUMBAR SPINE WITHOUT CONTRAST 10/03/2024 10:07:00 PM TECHNIQUE: CT of the lumbar spine was performed without the administration of intravenous contrast. Multiplanar reformatted images are provided for review. Automated exposure control, iterative reconstruction, and/or weight based adjustment of the mA/kV was utilized to reduce the radiation dose to as low as reasonably  achievable. COMPARISON: CT of the abdomen and pelvis, and reconstructions 04/27/2024. CLINICAL HISTORY: Low back pain, increased fracture risk. Per chart: Pt c/o sacral and hip pain worsening over the last few weeks. Pt has hx of sciatica and states pain is similar. No injuries reported per pt. FINDINGS: BONES AND ALIGNMENT: Normal vertebral body heights. There is generalized osteopenia. No new alignment abnormality. Chronic degenerative mild grade 1 anterolisthesis is again noted from L3-L4 down and there is a mild dextroscoliosis apex at L2. The lumbar segmentation is standard. New from 04/27/2024 there are nondisplaced longitudinal periarticular fractures of the anterior sacrum bilaterally, best seen on series 4 axial images 103 through 116. Possible sacral insufficiency fractures. There is a mild to moderate chronic upper plate compression fracture of L2, unchanged. . . No new lumbar fracture and no focal pathologic bone lesion. Both SI joints are patent. DEGENERATIVE CHANGES: At T11-T12 and T12-L1, the discs are normal in height. There is no herniation, canal or foraminal stenosis. At L1-L2 there is mild disc space loss. There is a left paracentral to far lateral disc osteophyte complex partially effacing the left subarticular zone, suggested mild mass effect on the left L2 nerve root. There is mild facet spurring left greater than right without foraminal compromise. At L2-L3, the disc is normal in height. There is facet joint spurring and dorsal ligamentous hypertrophy and a mild generalized disc bulge but no herniation or canal stenosis. There is mild facet hypertrophy without foraminal compromise. At L3-L4 the disc is normal in height. There is moderate facet hypertrophy and mild ligament thickening but no significant disc bulge and no herniation, canal or foraminal stenosis. At L4-L5 there is chronic disc collapse with interbody ankylosis over portions and vacuum phenomenon. There are bidirectional endplate  osteophytes, posteriorly are eccentric to the right with effacement of the bilateral subarticular zones, more so on the right and mild to moderate spinal canal stenosis. There is advanced left and moderate right facet hypertrophy. There is moderate to severe left and mild right foraminal stenosis. At L5-S1, there is mild disc space loss. The facet joints are hypertrophic and ankylosed. There is mild unilateral right foraminal stenosis. No bulge, herniation or canal stenosis. 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 There is aortic atherosclerosis. No acute paravertebral soft tissue findings. The patient is status post cholecystectomy with no biliary dilatation. IMPRESSION: 1. Nondisplaced longitudinal periarticular fractures of the anterior sacrum bilaterally, new from 04/27/2024, favored sacral insufficiency fractures. 2. Chronic disc collapse at L4-5 with interbody ankylosis over portions and bidirectional endplate osteophytes, resulting in acquired spinal canal and subarticular zone stenosis and moderate to severe left and mild right foraminal stenosis. 3. Mild  disc space loss at L1-2 with a left paracentral to far lateral disc osteophyte complex partially effacing the left subarticular zone, with suggested mild mass effect on the left L2 nerve root. 4. Mild to moderate chronic superior endplate compression fracture of L2, unchanged. 5. Chronic degenerative mild grade 1 anterolisthesis from L3-4 down and mild dextroscoliosis with apex at L2. 6. Mild disc space loss at L5-S1 with hypertrophic and ankylosed facet joints, resulting in mild unilateral right foraminal stenosis. Electronically signed by: Francis Quam MD 10/03/2024 11:01 PM EDT RP Workstation: [id number]     Subjective: No acute issues or events overnight denies nausea vomit diarrhea constipation effusions or chest pain.  Sacral pain continues worse with seated and standing but  improving generally from prior   Discharge Exam: Vitals:   10/10/24 1946 10/11/24 0542  BP: 129/69 (!) 141/67  Pulse: 97 73  Resp: 16 16  Temp: 97.6 F (36.4 C) 97.6 F (36.4 C)  SpO2: 100% 98%   Vitals:   10/10/24 0455 10/10/24 1332 10/10/24 1946 10/11/24 0542  BP: 132/74 138/88 129/69 (!) 141/67  Pulse: 70 96 97 73  Resp: 18 12 16 16   Temp: 97.7 F (36.5 C) 97.7 F (36.5 C) 97.6 F (36.4 C) 97.6 F (36.4 C)  TempSrc: Oral Oral  Oral  SpO2: 99% 100% 100% 98%  Weight:      Height:        General: Pt is alert, awake, not in acute distress Cardiovascular: RRR, S1/S2 +, no rubs, no gallops Respiratory: CTA bilaterally, no wheezing, no rhonchi Abdominal: Soft, NT, ND, bowel sounds + Extremities: no edema, no cyanosis  The results of significant diagnostics from this hospitalization (including imaging, microbiology, ancillary and laboratory) are listed below for reference.     Microbiology: No results found for this or any previous visit (from the past 240 hours).   Labs: BNP (last 3 results) No results for input(s): BNP in the last 8760 hours. Basic Metabolic Panel: Recent Labs  Lab 10/05/24 0444 10/10/24 0419  NA 139  --   K 3.6  --   CL 105  --   CO2 25  --   GLUCOSE 91  --   BUN 9  --   CREATININE 0.68 0.66  CALCIUM 9.4  --    Liver Function Tests: No results for input(s): AST, ALT, ALKPHOS, BILITOT, PROT, ALBUMIN in the last 168 hours. No results for input(s): LIPASE, AMYLASE in the last 168 hours. No results for input(s): AMMONIA in the last 168 hours. CBC: Recent Labs  Lab 10/05/24 0444  WBC 5.6  HGB 11.4*  HCT 37.6  MCV 94.2  PLT 333   Cardiac Enzymes: No results for input(s): CKTOTAL, CKMB, CKMBINDEX, TROPONINI in the last 168 hours. BNP: Invalid input(s): POCBNP CBG: Recent Labs  Lab 10/05/24 0745 10/05/24 1151 10/05/24 1629 10/05/24 2204 10/06/24 0742  GLUCAP 101* 108* 86 93 72   D-Dimer No  results for input(s): DDIMER in the last 72 hours. Hgb A1c No results for input(s): HGBA1C in the last 72 hours. Lipid Profile No results for input(s): CHOL, HDL, LDLCALC, TRIG, CHOLHDL, LDLDIRECT in the last 72 hours. Thyroid  function studies No results for input(s): TSH, T4TOTAL, T3FREE, THYROIDAB in the last 72 hours.  Invalid input(s): FREET3 Anemia work up No results for input(s): VITAMINB12, FOLATE, FERRITIN, TIBC, IRON, RETICCTPCT in the last 72 hours. Urinalysis    Component Value Date/Time   COLORURINE YELLOW 10/07/2024 1844   APPEARANCEUR CLEAR 10/07/2024 1844  LABSPEC 1.024 10/07/2024 1844   PHURINE 5.0 10/07/2024 1844   GLUCOSEU >=500 (A) 10/07/2024 1844   HGBUR NEGATIVE 10/07/2024 1844   BILIRUBINUR NEGATIVE 10/07/2024 1844   KETONESUR 5 (A) 10/07/2024 1844   PROTEINUR NEGATIVE 10/07/2024 1844   NITRITE NEGATIVE 10/07/2024 1844   LEUKOCYTESUR SMALL (A) 10/07/2024 1844   Sepsis Labs Recent Labs  Lab 10/05/24 0444  WBC 5.6   Microbiology No results found for this or any previous visit (from the past 240 hours).   Time coordinating discharge: Over 30 minutes  SIGNED:   Elsie JAYSON [place], DO [facility] 10/11/2024, 12:30 PM Pager   If 7PM-7AM, please contact night-coverage [email or web address]

## 2024-10-11 NOTE — Final Progress Note (Signed)
 Called facility to give report. Left VM. This RN's phone number left with transport staff.

## 2024-10-11 NOTE — TOC Transition Note (Addendum)
 Transition of Care [facility]) - Discharge Note   Patient Details  Name: Tracey Morgan MRN: [id number] Date of Birth: [birth year]  [facility]) CM/SW Contact:  Doneta Glenys DASEN, RN Phone Number: 10/11/2024, 1:32 PM   Clinical Narrative:    Per MD patient ready for DC to [name] . RN to call report prior to discharge ([name] [phone], Rm [phone]). RN, patient, and facility notified of DC. Discharge Summary and FL2 sent to facility via HUB. DC packet on chart includes face sheet, medical necessity, 1 signed prescription and discharge summary. Ambulance [facility] transport requested for patient. [facility] said if will be at least a couple hours. Please consult us  again if new needs arise.  Final next level of care: Skilled Nursing Facility Barriers to Discharge: Barriers Resolved   Patient Goals and CMS Choice Patient states their goals for this hospitalization and ongoing recovery are:: To [name] CMS Medicare.gov Compare Post Acute Care list provided to:: Patient (NA) Choice offered to / list presented to : Patient, NA [place] ownership interest in [facility].provided to:: Parent NA    Discharge Placement   Existing PASRR number confirmed : 10/11/24          Patient chooses bed at: [name] Patient to be transferred to facility by: [facility] Name of family member notified: Patient states she will call. Patient and family notified of of transfer: 10/11/24  Discharge Plan and Services Additional resources added to the After Visit Summary for   In-house Referral: NA Discharge Planning Services: CM Consult            DME Arranged: N/A DME Agency: NA       HH Arranged: NA HH Agency: NA        Social Drivers of Health (SDOH) Interventions SDOH Screenings   Food Insecurity: No Food Insecurity (10/04/2024)  Housing: Low Risk  (10/04/2024)  Transportation Needs: No Transportation Needs (10/04/2024)  Utilities: Not At Risk (10/04/2024)  Social  Connections: Moderately Isolated (10/04/2024)  Tobacco Use: Low Risk  (10/07/2024)     Readmission Risk Interventions    10/06/2024    5:20 PM  Readmission Risk Prevention Plan  Post Dischage Appt Complete  Medication Screening Complete  Transportation Screening Complete

## 2024-10-11 NOTE — Progress Notes (Signed)
 Mobility Specialist Progress Note:   10/11/24 1119  Mobility  Activity Pivoted/transferred from bed to chair  Level of Assistance Minimal assist, patient does 75% or more  Assistive Device Front wheel walker  Distance Ambulated (ft) 3 ft  Activity Response Tolerated well  Mobility Referral Yes  Mobility visit 1 Mobility  Mobility Specialist Start Time (ACUTE ONLY) 1100  Mobility Specialist Stop Time (ACUTE ONLY) 1115  Mobility Specialist Time Calculation (min) (ACUTE ONLY) 15 min   Pt was received in bed and agreed to mobility. Min A sit to stand. Returned to recliner with all needs met. Call bell in reach.  [employer] - Mobility Specialist

## 2024-10-13 ENCOUNTER — Encounter: Admitting: Rehabilitation

## 2024-10-14 ENCOUNTER — Ambulatory Visit

## 2024-10-18 ENCOUNTER — Encounter

## 2024-10-20 ENCOUNTER — Encounter: Admitting: Rehabilitation

## 2024-11-21 ENCOUNTER — Ambulatory Visit: Admitting: Family Medicine

## 2024-11-21 ENCOUNTER — Ambulatory Visit

## 2024-11-21 VITALS — BP 130/94 | Ht 62.0 in | Wt 174.0 lb

## 2024-11-21 DIAGNOSIS — M8000XA Age-related osteoporosis with current pathological fracture, unspecified site, initial encounter for fracture: Secondary | ICD-10-CM

## 2024-11-21 DIAGNOSIS — M8448XA Pathological fracture, other site, initial encounter for fracture: Secondary | ICD-10-CM

## 2024-11-21 NOTE — Progress Notes (Signed)
° °  Subjective:    Patient ID: Tracey Morgan, female    DOB: 78 y.o., 07/21/1946   MRN: [id number]  Chief Complaint: Osteoporosis management, bilateral sacral insufficiency fractures.  History of Present Illness Tracey Morgan is a 78 year old female with past medical history significant for recent THA on 07/27/2024, hypertension, T2DM (6.4 on 07/14/2024), and recently diagnosed bilateral sacral insufficiency fractures presenting for management of her osteoporosis.     Tracey Morgan is a 78 year old female with a history of hip replacement and back surgery who presents with hip and lower back pain.  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 - Pain attributed to suspected fractures, without recent falls or injuries. - Severe pain after last outpatient therapy session, requiring wheelchair assistance to car. - Currently performing home therapy focused on stability. - Uses a cane for mobility.  Lower back pain - History of lumbar spine surgery for bulging disc in 1981 and traction in 1991-1992, which relieved symptoms for many years. - Increasing lower back pain in recent years. - Pain worsens when lying in bed and improves with activity.  Fracture history - Previous left wrist fractures, both healed well in casts. - No other significant fractures until current suspected fractures.  Supplement and dietary intake - Takes 500 IU vitamin D  with K3 and K2 and 420 mg magnesium  glycinate daily. - Avoids calcium supplements due to prior issues but consumes calcium-rich foods such as yogurt.  She was recently hospitalized for bilateral sacral insufficiency fractures and is undergoing conservative management given that she is a poor surgical candidate.    Pain is well enough controlled. [facility] following her for sacral insufficiency fractures Referred to myself for evaluation and initiation of osteoporosis treatment  Review of  pertinent imaging: Nondisplaced bilateral fractures of the periarticular portions of the sacrum.    Objective:   Vitals:   11/21/24 1410  BP: (!) 130/94   Const: appears well, non-toxic, well groomed Psych: affect bright, interactive, smiling EENT: EOMI intact, conjunctiva appear normal Neck: no obvious masses, appears symmetric Resp: non-labored, appears symmetric Neuro: muscle bulk appears normal Skin: no obvious rashes noted      Assessment & Plan:   Assessment & Plan Osteoporosis with bilateral sacral insufficiency fractures   Osteoporosis is confirmed by bilateral sacral insufficiency fractures, indicating a high fracture risk without recent falls or trauma. There are concerns about osteonecrosis of the jaw with osteoporosis treatments. Discussed bisphosphonates and anabolic agents, particularly Evenity and Prolia. Evenity for 12 months followed by Prolia has shown significant bone density improvement. The mortality risk for hip fractures in those over 75 is 20-25%. Ordered blood work to assess baseline levels and kidney function. Increase vitamin D  to 800 IU daily and start calcium supplementation with 1200 mg daily. Ordered a bone density scan. Consider starting Evenity for 12 months followed by Prolia, pending insurance approval and lab results. Will coordinate with insurance for medication coverage and follow up with results of blood work and bone density scan to determine next steps.

## 2024-11-22 LAB — CBC
Hematocrit: 45.4 % (ref 34.0–46.6)
Hemoglobin: 14.9 g/dL (ref 11.1–15.9)
MCH: 30.2 pg (ref 26.6–33.0)
MCHC: 32.8 g/dL (ref 31.5–35.7)
MCV: 92 fL (ref 79–97)
Platelets: 411 x10E3/uL (ref 150–450)
RBC: 4.94 x10E6/uL (ref 3.77–5.28)
RDW: 13.6 % (ref 11.7–15.4)
WBC: 5.4 x10E3/uL (ref 3.4–10.8)

## 2024-11-22 LAB — COMPREHENSIVE METABOLIC PANEL WITH GFR
ALT: 15 IU/L (ref 0–32)
AST: 19 IU/L (ref 0–40)
Albumin: 4.6 g/dL (ref 3.8–4.8)
Alkaline Phosphatase: 214 IU/L — ABNORMAL HIGH (ref 49–135)
BUN/Creatinine Ratio: 14 (ref 12–28)
BUN: 13 mg/dL (ref 8–27)
Bilirubin Total: 1.2 mg/dL (ref 0.0–1.2)
CO2: 24 mmol/L (ref 20–29)
Calcium: 10.4 mg/dL — ABNORMAL HIGH (ref 8.7–10.3)
Chloride: 100 mmol/L (ref 96–106)
Creatinine, Ser: 0.96 mg/dL (ref 0.57–1.00)
Globulin, Total: 2.7 g/dL (ref 1.5–4.5)
Glucose: 131 mg/dL — ABNORMAL HIGH (ref 70–99)
Potassium: 4.2 mmol/L (ref 3.5–5.2)
Sodium: 141 mmol/L (ref 134–144)
Total Protein: 7.3 g/dL (ref 6.0–8.5)
eGFR: 61 mL/min/1.73 (ref >59)

## 2024-11-22 LAB — PHOSPHORUS: Phosphorus: 3.3 mg/dL (ref 3.0–4.3)

## 2024-11-22 LAB — VITAMIN D 25 HYDROXY (VIT D DEFICIENCY, FRACTURES): Vit D, 25-Hydroxy: 86.2 ng/mL (ref 30.0–100.0)

## 2024-11-22 LAB — MAGNESIUM: Magnesium: 2.2 mg/dL (ref 1.6–2.3)

## 2024-11-22 LAB — PARATHYROID HORMONE, INTACT (NO CA): PTH: 56 pg/mL (ref 15–65)

## 2024-11-23 ENCOUNTER — Ambulatory Visit: Payer: Self-pay

## 2024-11-23 DIAGNOSIS — R748 Abnormal levels of other serum enzymes: Secondary | ICD-10-CM

## 2024-12-07 LAB — CALCIUM: Calcium: 10.1 mg/dL (ref 8.7–10.3)

## 2024-12-07 LAB — GAMMA GT: GGT: 26 IU/L (ref 0–60)

## 2024-12-12 ENCOUNTER — Telehealth: Payer: Self-pay | Admitting: *Deleted

## 2024-12-12 ENCOUNTER — Ambulatory Visit (HOSPITAL_BASED_OUTPATIENT_CLINIC_OR_DEPARTMENT_OTHER)
Admission: RE | Admit: 2024-12-12 | Discharge: 2024-12-12 | Disposition: A | Discharge status: Home / Self Care | Source: Ambulatory Visit

## 2024-12-12 DIAGNOSIS — M8000XA Age-related osteoporosis with current pathological fracture, unspecified site, initial encounter for fracture: Secondary | ICD-10-CM | POA: Insufficient documentation

## 2024-12-12 DIAGNOSIS — M810 Age-related osteoporosis without current pathological fracture: Secondary | ICD-10-CM | POA: Insufficient documentation

## 2024-12-12 DIAGNOSIS — M8448XA Pathological fracture, other site, initial encounter for fracture: Secondary | ICD-10-CM | POA: Insufficient documentation

## 2024-12-12 NOTE — Telephone Encounter (Addendum)
 Patient is ready for scheduling on or after: 12/16/24 BUY AND BILL  Out-of-pocket cost due at time of visit: $508  Primary: [employer] Evenity co-insurance: 20% (approximately $508) Admin fee co-insurance: Covered at 100% unless OON, then $50 copay  Deductible: n/a  Prior Auth: No auth req'd clinicals faxed with form on 12/15/24  ** This summary of benefits is an estimation of the patient's out-of-pocket cost. Exact cost may vary based on individual plan coverage.

## 2024-12-16 NOTE — Telephone Encounter (Signed)
 Pt informed of below.  She doesn't think she can afford this. She states her PCP wrote scripts for estrogen and progesterone for her bone density. She is going to send Dr. Charles a message to see what he advises.
